# Patient Record
Sex: Male | Born: 1960 | Race: White | Hispanic: No | Smoking: Current every day smoker
Health system: Southern US, Community
[De-identification: ages and names within clinical notes are randomized; demographics above are authoritative.]

## PROBLEM LIST (undated history)

## (undated) ENCOUNTER — Ambulatory Visit: Admission: EM

## (undated) DIAGNOSIS — I10 Essential (primary) hypertension: Secondary | ICD-10-CM

## (undated) DIAGNOSIS — F329 Major depressive disorder, single episode, unspecified: Secondary | ICD-10-CM

## (undated) DIAGNOSIS — E039 Hypothyroidism, unspecified: Secondary | ICD-10-CM

## (undated) DIAGNOSIS — J449 Chronic obstructive pulmonary disease, unspecified: Secondary | ICD-10-CM

## (undated) DIAGNOSIS — E119 Type 2 diabetes mellitus without complications: Secondary | ICD-10-CM

## (undated) DIAGNOSIS — Z87442 Personal history of urinary calculi: Secondary | ICD-10-CM

## (undated) DIAGNOSIS — F32A Depression, unspecified: Secondary | ICD-10-CM

## (undated) DIAGNOSIS — M1990 Unspecified osteoarthritis, unspecified site: Secondary | ICD-10-CM

## (undated) DIAGNOSIS — G4730 Sleep apnea, unspecified: Secondary | ICD-10-CM

## (undated) DIAGNOSIS — K219 Gastro-esophageal reflux disease without esophagitis: Secondary | ICD-10-CM

## (undated) HISTORY — PX: RECTAL POLYPECTOMY: SHX2309

## (undated) HISTORY — DX: Hypothyroidism, unspecified: E03.9

## (undated) HISTORY — PX: OTHER SURGICAL HISTORY: SHX169

## (undated) HISTORY — PX: COLONOSCOPY: SHX174

---

## 2006-03-31 DIAGNOSIS — E1165 Type 2 diabetes mellitus with hyperglycemia: Secondary | ICD-10-CM | POA: Insufficient documentation

## 2006-03-31 DIAGNOSIS — N1831 Chronic kidney disease, stage 3a: Secondary | ICD-10-CM | POA: Insufficient documentation

## 2006-05-07 ENCOUNTER — Emergency Department: Payer: Self-pay | Admitting: Emergency Medicine

## 2008-01-14 ENCOUNTER — Ambulatory Visit: Payer: Self-pay | Admitting: Internal Medicine

## 2008-01-26 ENCOUNTER — Ambulatory Visit: Payer: Self-pay | Admitting: Internal Medicine

## 2008-09-14 ENCOUNTER — Ambulatory Visit: Payer: Self-pay | Admitting: Internal Medicine

## 2008-09-24 ENCOUNTER — Emergency Department: Payer: Self-pay | Admitting: Emergency Medicine

## 2010-04-24 ENCOUNTER — Ambulatory Visit: Payer: Self-pay | Admitting: Internal Medicine

## 2011-02-21 ENCOUNTER — Ambulatory Visit: Payer: Self-pay | Admitting: Internal Medicine

## 2011-04-09 ENCOUNTER — Ambulatory Visit: Payer: Self-pay | Admitting: Anesthesiology

## 2011-04-11 ENCOUNTER — Ambulatory Visit: Payer: Self-pay | Admitting: Emergency Medicine

## 2011-04-11 LAB — BASIC METABOLIC PANEL
Anion Gap: 7 (ref 7–16)
BUN: 14 mg/dL (ref 7–18)
Calcium, Total: 9.3 mg/dL (ref 8.5–10.1)
Chloride: 105 mmol/L (ref 98–107)
Co2: 28 mmol/L (ref 21–32)
Creatinine: 0.85 mg/dL (ref 0.60–1.30)
EGFR (African American): >60
EGFR (Non-African Amer.): >60
Glucose: 155 mg/dL — ABNORMAL HIGH (ref 65–99)
Osmolality: 283 (ref 275–301)
Potassium: 3.7 mmol/L (ref 3.5–5.1)
Sodium: 140 mmol/L (ref 136–145)

## 2011-04-14 LAB — PATHOLOGY REPORT

## 2013-06-14 DIAGNOSIS — F329 Major depressive disorder, single episode, unspecified: Secondary | ICD-10-CM | POA: Insufficient documentation

## 2013-06-14 DIAGNOSIS — I10 Essential (primary) hypertension: Secondary | ICD-10-CM | POA: Insufficient documentation

## 2013-06-14 DIAGNOSIS — G894 Chronic pain syndrome: Secondary | ICD-10-CM | POA: Insufficient documentation

## 2013-06-14 DIAGNOSIS — E785 Hyperlipidemia, unspecified: Secondary | ICD-10-CM | POA: Insufficient documentation

## 2013-06-14 DIAGNOSIS — J449 Chronic obstructive pulmonary disease, unspecified: Secondary | ICD-10-CM | POA: Insufficient documentation

## 2013-06-14 DIAGNOSIS — E1121 Type 2 diabetes mellitus with diabetic nephropathy: Secondary | ICD-10-CM | POA: Insufficient documentation

## 2013-06-14 DIAGNOSIS — F32A Depression, unspecified: Secondary | ICD-10-CM | POA: Insufficient documentation

## 2013-06-14 DIAGNOSIS — Z720 Tobacco use: Secondary | ICD-10-CM | POA: Insufficient documentation

## 2014-01-25 DIAGNOSIS — M5416 Radiculopathy, lumbar region: Secondary | ICD-10-CM | POA: Insufficient documentation

## 2014-01-31 ENCOUNTER — Ambulatory Visit: Payer: Self-pay | Admitting: Physical Medicine and Rehabilitation

## 2014-02-14 ENCOUNTER — Ambulatory Visit: Payer: Self-pay | Admitting: Internal Medicine

## 2014-02-28 DIAGNOSIS — M5136 Other intervertebral disc degeneration, lumbar region: Secondary | ICD-10-CM | POA: Insufficient documentation

## 2014-07-13 DIAGNOSIS — R0602 Shortness of breath: Secondary | ICD-10-CM | POA: Insufficient documentation

## 2014-07-23 NOTE — Op Note (Signed)
PATIENT NAME:  Darin Waters, Darin Waters MR#:  [id number] DATE OF BIRTH:  Aug 13, 1960  DATE OF PROCEDURE:  04/11/2011  PREOPERATIVE DIAGNOSIS: Rectal tumor. Possible villous adenoma at the junction of the dentate line and rectal mucosa.   POSTOPERATIVE DIAGNOSIS: Rectal tumor. Possible villous adenoma at the junction of the dentate line and rectal mucosa.   PROCEDURES PERFORMED:  1. Sigmoidoscopy.  2. Removal of rectal tumor with suture ligation.   SURGEON: Vonzell Lindblad S. Cecelia ByarsHashmi, MD  INDICATIONS: First of all this patient had a colonoscopy done. Retroflexion was found to have a polyp sessile lesion starting from dentate line going towards the distal rectal mucosa. It wasn'Waters very big but you could not remove it through the colonoscope because it was very near the dentate line. Patient was then brought to surgery.   DESCRIPTION OF PROCEDURE: Under general anesthesia in jackknife position rectal examination was performed. Sigmoidoscopy was done to 25 cm. I could visualize the area and blood was oozing from the surface. After dilating the anus a little bit then put retractor in and I grabbed the tumor with Allis clamps. It was soft. I put one suture way up in the top and rest of the lesion was excised with the Bovie. After that we closed with running and interrupted 3-0 chromic catgut sutures. At the time of surgery there wasn'Waters bleeding anymore and it was soft lesion. It doesn'Waters  look like to be cancerous but was very vascular. After that we made sure there was no bleeding. After that we put a piece of Surgicel with Avitene in the rectum. Patient tolerated well. Sent to recovery room in satisfactory condition.   ____________________________ Alton RevereMasud S. Cecelia ByarsHashmi, MD msh:cms D: 04/11/2011 10:24:14 ET Waters: 04/11/2011 13:28:10 ET JOB#: [id number] cc: Erienne Spelman S. Cecelia ByarsHashmi, MD, <Dictator> Meryle ReadyMASUD S Jguadalupe Opiela MD ELECTRONICALLY SIGNED 04/17/2011 9:56

## 2015-04-16 ENCOUNTER — Encounter: Payer: Self-pay | Admitting: *Deleted

## 2015-04-16 ENCOUNTER — Emergency Department
Admission: EM | Admit: 2015-04-16 | Discharge: 2015-04-16 | Discharge status: Against Medical Advice | Payer: Self-pay | Attending: Emergency Medicine | Admitting: Emergency Medicine

## 2015-04-16 ENCOUNTER — Emergency Department
Admission: EM | Admit: 2015-04-16 | Discharge: 2015-04-16 | Disposition: A | Discharge status: Home / Self Care | Payer: Self-pay | Attending: Emergency Medicine | Admitting: Emergency Medicine

## 2015-04-16 ENCOUNTER — Emergency Department: Payer: Self-pay

## 2015-04-16 DIAGNOSIS — M25512 Pain in left shoulder: Secondary | ICD-10-CM | POA: Insufficient documentation

## 2015-04-16 DIAGNOSIS — I10 Essential (primary) hypertension: Secondary | ICD-10-CM | POA: Insufficient documentation

## 2015-04-16 DIAGNOSIS — F17200 Nicotine dependence, unspecified, uncomplicated: Secondary | ICD-10-CM | POA: Insufficient documentation

## 2015-04-16 DIAGNOSIS — M542 Cervicalgia: Secondary | ICD-10-CM | POA: Insufficient documentation

## 2015-04-16 DIAGNOSIS — M5412 Radiculopathy, cervical region: Secondary | ICD-10-CM | POA: Insufficient documentation

## 2015-04-16 DIAGNOSIS — E119 Type 2 diabetes mellitus without complications: Secondary | ICD-10-CM | POA: Insufficient documentation

## 2015-04-16 DIAGNOSIS — M25519 Pain in unspecified shoulder: Secondary | ICD-10-CM

## 2015-04-16 HISTORY — DX: Essential (primary) hypertension: I10

## 2015-04-16 HISTORY — DX: Type 2 diabetes mellitus without complications: E11.9

## 2015-04-16 LAB — CBC
HCT: 45 % (ref 40.0–52.0)
Hemoglobin: 15.1 g/dL (ref 13.0–18.0)
MCH: 31.8 pg (ref 26.0–34.0)
MCHC: 33.5 g/dL (ref 32.0–36.0)
MCV: 95 fL (ref 80.0–100.0)
Platelets: 200 10*3/uL (ref 150–440)
RBC: 4.74 MIL/uL (ref 4.40–5.90)
RDW: 13.1 % (ref 11.5–14.5)
WBC: 9.5 10*3/uL (ref 3.8–10.6)

## 2015-04-16 LAB — BASIC METABOLIC PANEL
Anion gap: 11 (ref 5–15)
BUN: 17 mg/dL (ref 6–20)
CO2: 25 mmol/L (ref 22–32)
Calcium: 9.9 mg/dL (ref 8.9–10.3)
Chloride: 101 mmol/L (ref 101–111)
Creatinine, Ser: 0.99 mg/dL (ref 0.61–1.24)
GFR calc Af Amer: >60 mL/min (ref >60)
GFR calc non Af Amer: >60 mL/min (ref >60)
Glucose, Bld: 301 mg/dL — ABNORMAL HIGH (ref 65–99)
Potassium: 4.3 mmol/L (ref 3.5–5.1)
Sodium: 137 mmol/L (ref 135–145)

## 2015-04-16 LAB — TROPONIN I: Troponin I: <0.03 ng/mL (ref <0.031)

## 2015-04-16 MED ORDER — PREDNISONE 10 MG (21) PO TBPK: 10.0000 mg | ORAL_TABLET | Freq: Every day | ORAL | Status: DC

## 2015-04-16 MED ORDER — OXYCODONE-ACETAMINOPHEN 5-325 MG PO TABS: 2.0000 | ORAL_TABLET | Freq: Four times a day (QID) | ORAL | Status: DC | PRN

## 2015-04-16 NOTE — ED Notes (Signed)
States he developed a stiffness to left side of neck about 9 days ago  Then about 7 days ago he said the pain moved into left shoulder and arm  Denies any injury

## 2015-04-16 NOTE — ED Notes (Addendum)
Pt states muscle spasms in his neck and back, states pain for 9 days, pt was here earlier and left because "I sat in the room and no one came, just told the doctor to take care of everyone else"

## 2015-04-16 NOTE — ED Provider Notes (Signed)
[facility] Emergency Department Provider Note     Time seen: ----------------------------------------- 7:00 PM on 04/16/2015 -----------------------------------------    I have reviewed the triage vital signs and the nursing notes.   HISTORY  Chief Complaint Spasms and Neck Pain    HPI Darin Waters is a 55 y.o. male who presents ER for muscle spasms in his neck and back. Patient states he had pain for 9 days, was here earlier and left because he could not wait.Patient describes radiating pain down his left shoulder and arm. Patient states he has a history of slipped disc in his low back, has not typically had neck pain like this before. She denies fevers chills or other complaints. She states he knows his blood sugar was elevated over 300 today.   Past Medical History  Diagnosis Date  . Diabetes mellitus without complication (HCC)   . Hypertension     There are no active problems to display for this patient.   History reviewed. No pertinent past surgical history.  Allergies Review of patient's allergies indicates no known allergies.  Social History Social History  Substance Use Topics  . Smoking status: Current Every Day Smoker  . Smokeless tobacco: None  . Alcohol Use: No    Review of Systems Constitutional: Negative for fever. Eyes: Negative for visual changes. ENT: Negative for sore throat. Cardiovascular: Negative for chest pain. Respiratory: Negative for shortness of breath. Gastrointestinal: Negative for abdominal pain, vomiting and diarrhea. Genitourinary: Negative for dysuria. Musculoskeletal: Positive for left shoulder, left arm pain Skin: Negative for rash. Neurological: Negative for headaches, focal weakness or numbness.  10-point ROS otherwise negative.  ____________________________________________   PHYSICAL EXAM:  VITAL SIGNS: ED Triage Vitals  Enc Vitals Group     BP 04/16/15 1851 152/68 mmHg     Pulse  Rate 04/16/15 1851 90     Resp 04/16/15 1851 18     Temp 04/16/15 1851 98.5 F (36.9 C)     Temp Source 04/16/15 1851 Oral     SpO2 04/16/15 1851 96 %     Weight 04/16/15 1851 200 lb (90.719 kg)     Height 04/16/15 1851 5\' 6"  (1.676 m)     Head Cir --      Peak Flow --      Pain Score 04/16/15 1851 5     Pain Loc --      Pain Edu? --      Excl. in GC? --     Constitutional: Alert and oriented. Well appearing and in no distress. Eyes: Conjunctivae are normal. PERRL. Normal extraocular movements. ENT   Head: Normocephalic and atraumatic.   Nose: No congestion/rhinnorhea.   Mouth/Throat: Mucous membranes are moist.   Neck: No stridor. Cardiovascular: Normal rate, regular rhythm. Normal and symmetric distal pulses are present in all extremities. No murmurs, rubs, or gallops. Respiratory: Normal respiratory effort without tachypnea nor retractions. Breath sounds are clear and equal bilaterally. No wheezes/rales/rhonchi. Gastrointestinal: Soft and nontender. No distention. No abdominal bruits.  Musculoskeletal: Patient describes a C6 radiculopathy, mild pain with range of motion of left shoulder. Neurologic:  Normal speech and language. No gross focal neurologic deficits are appreciated. Speech is normal. No gait instability. Skin:  Skin is warm, dry and intact. No rash noted. Psychiatric: Mood and affect are normal. Speech and behavior are normal. Patient exhibits appropriate insight and judgment. ____________________________________________  ED COURSE:  Pertinent labs & imaging results that were available during my care of the patient were  reviewed by me and considered in my medical decision making (see chart for details). EKG earlier was normal, labs are unremarkable. ____________________________________________    RADIOLOGY Images were viewed by me  Chest x-ray today was normal  ____________________________________________  FINAL ASSESSMENT AND  PLAN  Cervical radiculopathy  Plan: Patient with labs and imaging as dictated above. Patient clinically with cervical radiculopathy, will prescribe pain medicine and steroid taper. He is stable for outpatient follow-up with physiatry.   Emily FilbertWilliams, Jonathan E, MD   Emily FilbertJonathan E Williams, MD 04/16/15 [id number]

## 2015-04-16 NOTE — Discharge Instructions (Signed)

## 2015-04-16 NOTE — ED Provider Notes (Signed)
Patient eloped prior to my evaluation. He had presented to the ER with some stiffness in the left side is neck this started about 9 days ago. Patient was ambulatory in the ER, stated he couldn't wait to be seen.  EKG: Interpreted by me, normal sinus rhythm with a rate of 66 bpm, normal PR interval, normal QRS, normal QT interval, normal axis.  Emily FilbertJonathan E Williams, MD 04/16/15 [phone]

## 2015-04-16 NOTE — ED Notes (Signed)
Has had neck pain for 2 days on left side but had left shoulder pain for 7 days, denies injury , pain comes and goes

## 2015-05-11 ENCOUNTER — Other Ambulatory Visit: Payer: Self-pay | Admitting: Physical Medicine and Rehabilitation

## 2015-05-11 DIAGNOSIS — M5412 Radiculopathy, cervical region: Secondary | ICD-10-CM

## 2015-05-30 ENCOUNTER — Ambulatory Visit
Admission: RE | Admit: 2015-05-30 | Discharge: 2015-05-30 | Disposition: A | Discharge status: Home / Self Care | Payer: BLUE CROSS/BLUE SHIELD | Source: Ambulatory Visit | Attending: Physical Medicine and Rehabilitation | Admitting: Physical Medicine and Rehabilitation

## 2015-05-30 DIAGNOSIS — M4802 Spinal stenosis, cervical region: Secondary | ICD-10-CM | POA: Insufficient documentation

## 2015-05-30 DIAGNOSIS — M50222 Other cervical disc displacement at C5-C6 level: Secondary | ICD-10-CM | POA: Diagnosis not present

## 2015-05-30 DIAGNOSIS — M5412 Radiculopathy, cervical region: Secondary | ICD-10-CM | POA: Diagnosis present

## 2015-05-30 DIAGNOSIS — M50223 Other cervical disc displacement at C6-C7 level: Secondary | ICD-10-CM | POA: Insufficient documentation

## 2015-06-14 DIAGNOSIS — M4712 Other spondylosis with myelopathy, cervical region: Secondary | ICD-10-CM | POA: Insufficient documentation

## 2015-06-14 DIAGNOSIS — M4802 Spinal stenosis, cervical region: Secondary | ICD-10-CM | POA: Insufficient documentation

## 2015-06-14 DIAGNOSIS — M5412 Radiculopathy, cervical region: Secondary | ICD-10-CM | POA: Insufficient documentation

## 2015-06-15 ENCOUNTER — Other Ambulatory Visit: Payer: Self-pay | Admitting: Neurosurgery

## 2015-07-09 NOTE — Pre-Procedure Instructions (Signed)
Nancy FetterDanny T Hamblen  07/09/2015      [phone] [place] [address] [facility] [address] [place] [id number] Phone: [phone] Fax: [phone]    Your procedure is scheduled on Wednesday April 19th  [facility] at 12:00 pm  Call this number if you have problems the morning of surgery  [phone]  If questions prior to surgery call [phone] between 8 am and 4:00 pm   Remember:  Do not eat food or drink liquids after midnight.  Take these medicines the morning of surgery with A SIP OF WATER: Celexa, pain pill if needed  Stop taking Aspirin, and anti-inflammatories (including your Relafen, ibuprofen, Motrin, aleve, naproxen, advil), vitamin and mineral supplements)  DO NOT TAKE DIABETIC PILLS THE MORNING OF SURGERY  How to Manage Your Diabetes Before and After Surgery  Why is it important to control my blood sugar before and after surgery? . Improving blood sugar levels before and after surgery helps healing and can limit problems. . A way of improving blood sugar control is eating a healthy diet by: o  Eating less sugar and carbohydrates o  Increasing activity/exercise o  Talking with your doctor about reaching your blood sugar goals . High blood sugars (greater than 180 mg/dL) can raise your risk of infections and slow your recovery, so you will need to focus on controlling your diabetes during the weeks before surgery. . Make sure that the doctor who takes care of your diabetes knows about your planned surgery including the date and location.  How do I manage my blood sugar before surgery? . Check your blood sugar at least 4 times a day, starting 2 days before surgery, to make sure that the level is not too high or low. o Check your blood sugar the morning of your surgery when you wake up and every 2 hours until you get to the Short Stay unit. . If your blood sugar is less than 70  mg/dL, you will need to treat for low blood sugar: o Do not take insulin. o Treat a low blood sugar (less than 70 mg/dL) with  cup of clear juice (cranberry or apple), 4 glucose tablets, OR glucose gel. o Recheck blood sugar in 15 minutes after treatment (to make sure it is greater than 70 mg/dL). If your blood sugar is not greater than 70 mg/dL on recheck, call [phone] for further instructions. . Report your blood sugar to the short stay nurse when you get to Short Stay.  . If you are admitted to the hospital after surgery: o Your blood sugar will be checked by the staff and you will probably be given insulin after surgery (instead of oral diabetes medicines) to make sure you have good blood sugar levels. o The goal for blood sugar control after surgery is 80-180 mg/dL     Do not wear jewelry.  Do not wear lotions, powders, or colognes.  You may not wear deodorant.  Do not shave 48 hours prior to surgery.  Men may shave face and neck.  Do not bring valuables to the hospital.  [facility] is not responsible for any belongings or valuables.  Contacts, dentures or bridgework may not be worn into surgery.  Leave your suitcase in the car.  After surgery it may be brought to your room.  For patients admitted to the hospital, discharge time will  be determined by your treatment team.  Special instructions: Shower with CHG soap as instructed the night before and morning of surgery  Please read over the following fact sheets that you were given. Pain Booklet, Coughing and Deep Breathing, MRSA Information and Surgical Site Infection Prevention

## 2015-07-10 ENCOUNTER — Encounter (HOSPITAL_COMMUNITY): Payer: Self-pay

## 2015-07-10 ENCOUNTER — Encounter (HOSPITAL_COMMUNITY)
Admission: RE | Admit: 2015-07-10 | Discharge: 2015-07-10 | Disposition: A | Discharge status: Home / Self Care | Payer: BLUE CROSS/BLUE SHIELD | Source: Ambulatory Visit | Attending: Neurosurgery | Admitting: Neurosurgery

## 2015-07-10 DIAGNOSIS — Z01812 Encounter for preprocedural laboratory examination: Secondary | ICD-10-CM | POA: Insufficient documentation

## 2015-07-10 DIAGNOSIS — M4722 Other spondylosis with radiculopathy, cervical region: Secondary | ICD-10-CM | POA: Insufficient documentation

## 2015-07-10 DIAGNOSIS — M4712 Other spondylosis with myelopathy, cervical region: Secondary | ICD-10-CM | POA: Insufficient documentation

## 2015-07-10 LAB — BASIC METABOLIC PANEL
Anion gap: 11 (ref 5–15)
BUN: 13 mg/dL (ref 6–20)
CO2: 24 mmol/L (ref 22–32)
Calcium: 9.5 mg/dL (ref 8.9–10.3)
Chloride: 103 mmol/L (ref 101–111)
Creatinine, Ser: 0.94 mg/dL (ref 0.61–1.24)
GFR calc Af Amer: >60 mL/min (ref >60)
GFR calc non Af Amer: >60 mL/min (ref >60)
Glucose, Bld: 142 mg/dL — ABNORMAL HIGH (ref 65–99)
Potassium: 4.1 mmol/L (ref 3.5–5.1)
Sodium: 138 mmol/L (ref 135–145)

## 2015-07-10 LAB — SURGICAL PCR SCREEN
MRSA, PCR: NEGATIVE
Staphylococcus aureus: NEGATIVE

## 2015-07-10 LAB — CBC
HCT: 43.5 % (ref 39.0–52.0)
Hemoglobin: 14.7 g/dL (ref 13.0–17.0)
MCH: 32.5 pg (ref 26.0–34.0)
MCHC: 33.8 g/dL (ref 30.0–36.0)
MCV: 96 fL (ref 78.0–100.0)
Platelets: 215 10*3/uL (ref 150–400)
RBC: 4.53 MIL/uL (ref 4.22–5.81)
RDW: 12.7 % (ref 11.5–15.5)
WBC: 9.8 10*3/uL (ref 4.0–10.5)

## 2015-07-10 LAB — GLUCOSE, CAPILLARY: Glucose-Capillary: 165 mg/dL — ABNORMAL HIGH (ref 65–99)

## 2015-07-10 NOTE — Progress Notes (Addendum)
Cardiologist Dr Marcina MillardAlexander Paraschos [place] [facility]  [phone] PCP Dr Sharalyn InkSarah Gauger [facility] [place]  Stress echo test 2016  In Care everywhere/ normal  Denies other cardiac studies [facility]

## 2015-07-11 LAB — HEMOGLOBIN A1C
Hgb A1c MFr Bld: 7.5 % — ABNORMAL HIGH (ref 4.8–5.6)
Mean Plasma Glucose: 169 mg/dL

## 2015-07-17 MED ORDER — CEFAZOLIN SODIUM-DEXTROSE 2-4 GM/100ML-% IV SOLN
2.0000 g | INTRAVENOUS | Status: AC
  Administered 2015-07-18: 2 g via INTRAVENOUS
  Filled 2015-07-17: qty 100

## 2015-07-18 ENCOUNTER — Ambulatory Visit (HOSPITAL_COMMUNITY): Payer: BLUE CROSS/BLUE SHIELD

## 2015-07-18 ENCOUNTER — Ambulatory Visit (HOSPITAL_COMMUNITY): Payer: BLUE CROSS/BLUE SHIELD | Admitting: Certified Registered Nurse Anesthetist

## 2015-07-18 ENCOUNTER — Encounter (HOSPITAL_COMMUNITY): Payer: Self-pay | Admitting: *Deleted

## 2015-07-18 ENCOUNTER — Encounter (HOSPITAL_COMMUNITY)
Admission: RE | Disposition: A | Discharge status: Home / Self Care | Payer: Self-pay | Source: Ambulatory Visit | Attending: Neurosurgery

## 2015-07-18 ENCOUNTER — Observation Stay (HOSPITAL_COMMUNITY)
Admission: RE | Admit: 2015-07-18 | Discharge: 2015-07-19 | Disposition: A | Discharge status: Home / Self Care | Payer: BLUE CROSS/BLUE SHIELD | Source: Ambulatory Visit | Attending: Neurosurgery | Admitting: Neurosurgery

## 2015-07-18 DIAGNOSIS — Z79899 Other long term (current) drug therapy: Secondary | ICD-10-CM | POA: Insufficient documentation

## 2015-07-18 DIAGNOSIS — M4722 Other spondylosis with radiculopathy, cervical region: Secondary | ICD-10-CM | POA: Diagnosis not present

## 2015-07-18 DIAGNOSIS — Z419 Encounter for procedure for purposes other than remedying health state, unspecified: Secondary | ICD-10-CM

## 2015-07-18 DIAGNOSIS — J449 Chronic obstructive pulmonary disease, unspecified: Secondary | ICD-10-CM | POA: Diagnosis not present

## 2015-07-18 DIAGNOSIS — I10 Essential (primary) hypertension: Secondary | ICD-10-CM | POA: Diagnosis not present

## 2015-07-18 DIAGNOSIS — F17210 Nicotine dependence, cigarettes, uncomplicated: Secondary | ICD-10-CM | POA: Diagnosis not present

## 2015-07-18 DIAGNOSIS — M4712 Other spondylosis with myelopathy, cervical region: Secondary | ICD-10-CM | POA: Insufficient documentation

## 2015-07-18 DIAGNOSIS — Z7984 Long term (current) use of oral hypoglycemic drugs: Secondary | ICD-10-CM | POA: Insufficient documentation

## 2015-07-18 DIAGNOSIS — E119 Type 2 diabetes mellitus without complications: Secondary | ICD-10-CM | POA: Diagnosis not present

## 2015-07-18 DIAGNOSIS — M5020 Other cervical disc displacement, unspecified cervical region: Secondary | ICD-10-CM | POA: Diagnosis present

## 2015-07-18 DIAGNOSIS — M4802 Spinal stenosis, cervical region: Secondary | ICD-10-CM | POA: Diagnosis not present

## 2015-07-18 DIAGNOSIS — Z7982 Long term (current) use of aspirin: Secondary | ICD-10-CM | POA: Diagnosis not present

## 2015-07-18 DIAGNOSIS — M50122 Cervical disc disorder at C5-C6 level with radiculopathy: Principal | ICD-10-CM | POA: Insufficient documentation

## 2015-07-18 HISTORY — PX: ANTERIOR CERVICAL DECOMP/DISCECTOMY FUSION: SHX1161

## 2015-07-18 LAB — GLUCOSE, CAPILLARY
Glucose-Capillary: 112 mg/dL — ABNORMAL HIGH (ref 65–99)
Glucose-Capillary: 204 mg/dL — ABNORMAL HIGH (ref 65–99)
Glucose-Capillary: 220 mg/dL — ABNORMAL HIGH (ref 65–99)

## 2015-07-18 SURGERY — ANTERIOR CERVICAL DECOMPRESSION/DISCECTOMY FUSION 2 LEVELS
Anesthesia: General | Site: Spine Cervical

## 2015-07-18 MED ORDER — MORPHINE SULFATE (PF) 2 MG/ML IV SOLN
1.0000 mg | INTRAVENOUS | Status: DC | PRN
  Administered 2015-07-19: 2 mg via INTRAVENOUS
  Filled 2015-07-18: qty 1

## 2015-07-18 MED ORDER — BISACODYL 10 MG RE SUPP: 10.0000 mg | Freq: Every day | RECTAL | Status: DC | PRN

## 2015-07-18 MED ORDER — GLYCOPYRROLATE 0.2 MG/ML IJ SOLN
INTRAMUSCULAR | Status: DC | PRN
  Administered 2015-07-18: 0.4 mg via INTRAVENOUS

## 2015-07-18 MED ORDER — DEXAMETHASONE SODIUM PHOSPHATE 4 MG/ML IJ SOLN
INTRAMUSCULAR | Status: DC | PRN
  Administered 2015-07-18: 8 mg via INTRAVENOUS

## 2015-07-18 MED ORDER — FENTANYL CITRATE (PF) 100 MCG/2ML IJ SOLN
25.0000 ug | INTRAMUSCULAR | Status: DC | PRN
  Administered 2015-07-18 (×2): 50 ug via INTRAVENOUS

## 2015-07-18 MED ORDER — DIAZEPAM 5 MG PO TABS
5.0000 mg | ORAL_TABLET | Freq: Four times a day (QID) | ORAL | Status: DC | PRN
  Administered 2015-07-18 – 2015-07-19 (×2): 5 mg via ORAL
  Filled 2015-07-18 (×2): qty 1

## 2015-07-18 MED ORDER — PROPOFOL 10 MG/ML IV BOLUS
INTRAVENOUS | Status: AC
  Filled 2015-07-18: qty 20

## 2015-07-18 MED ORDER — ESMOLOL HCL 100 MG/10ML IV SOLN
INTRAVENOUS | Status: DC | PRN
  Administered 2015-07-18: 20 mg via INTRAVENOUS

## 2015-07-18 MED ORDER — FENTANYL CITRATE (PF) 250 MCG/5ML IJ SOLN
INTRAMUSCULAR | Status: AC
  Filled 2015-07-18: qty 5

## 2015-07-18 MED ORDER — DOCUSATE SODIUM 100 MG PO CAPS
100.0000 mg | ORAL_CAPSULE | Freq: Two times a day (BID) | ORAL | Status: DC
  Administered 2015-07-18 – 2015-07-19 (×2): 100 mg via ORAL
  Filled 2015-07-18 (×2): qty 1

## 2015-07-18 MED ORDER — BACITRACIN ZINC 500 UNIT/GM EX OINT
TOPICAL_OINTMENT | CUTANEOUS | Status: DC | PRN
  Administered 2015-07-18: 1 via TOPICAL

## 2015-07-18 MED ORDER — EPHEDRINE SULFATE 50 MG/ML IJ SOLN
INTRAMUSCULAR | Status: DC | PRN
  Administered 2015-07-18 (×3): 10 mg via INTRAVENOUS

## 2015-07-18 MED ORDER — HEMOSTATIC AGENTS (NO CHARGE) OPTIME
TOPICAL | Status: DC | PRN
  Administered 2015-07-18: 1 via TOPICAL

## 2015-07-18 MED ORDER — ONDANSETRON HCL 4 MG/2ML IJ SOLN
INTRAMUSCULAR | Status: AC
  Filled 2015-07-18: qty 2

## 2015-07-18 MED ORDER — SUCCINYLCHOLINE CHLORIDE 20 MG/ML IJ SOLN
INTRAMUSCULAR | Status: AC
  Filled 2015-07-18: qty 1

## 2015-07-18 MED ORDER — ACETAMINOPHEN 325 MG PO TABS
650.0000 mg | ORAL_TABLET | ORAL | Status: DC | PRN
Start: 2015-07-18 — End: 2015-07-19

## 2015-07-18 MED ORDER — CEFAZOLIN SODIUM-DEXTROSE 2-4 GM/100ML-% IV SOLN
2.0000 g | Freq: Three times a day (TID) | INTRAVENOUS | Status: AC
  Administered 2015-07-18 – 2015-07-19 (×2): 2 g via INTRAVENOUS
  Filled 2015-07-18 (×2): qty 100

## 2015-07-18 MED ORDER — MIDAZOLAM HCL 2 MG/2ML IJ SOLN
INTRAMUSCULAR | Status: AC
Start: 2015-07-18 — End: 2015-07-18
  Filled 2015-07-18: qty 2

## 2015-07-18 MED ORDER — EPHEDRINE SULFATE 50 MG/ML IJ SOLN
INTRAMUSCULAR | Status: AC
  Filled 2015-07-18: qty 1

## 2015-07-18 MED ORDER — LACTATED RINGERS IV SOLN
INTRAVENOUS | Status: DC
  Administered 2015-07-18 (×3): via INTRAVENOUS

## 2015-07-18 MED ORDER — CITALOPRAM HYDROBROMIDE 10 MG PO TABS
10.0000 mg | ORAL_TABLET | Freq: Every day | ORAL | Status: DC
  Administered 2015-07-18 – 2015-07-19 (×2): 10 mg via ORAL
  Filled 2015-07-18 (×2): qty 1

## 2015-07-18 MED ORDER — PHENYLEPHRINE 40 MCG/ML (10ML) SYRINGE FOR IV PUSH (FOR BLOOD PRESSURE SUPPORT)
PREFILLED_SYRINGE | INTRAVENOUS | Status: AC
  Filled 2015-07-18: qty 10

## 2015-07-18 MED ORDER — ACETAMINOPHEN 650 MG RE SUPP: 650.0000 mg | RECTAL | Status: DC | PRN

## 2015-07-18 MED ORDER — INSULIN ASPART 100 UNIT/ML ~~LOC~~ SOLN
0.0000 [IU] | SUBCUTANEOUS | Status: DC
  Administered 2015-07-18 – 2015-07-19 (×3): 7 [IU] via SUBCUTANEOUS
  Administered 2015-07-19: 4 [IU] via SUBCUTANEOUS
  Administered 2015-07-19: 7 [IU] via SUBCUTANEOUS

## 2015-07-18 MED ORDER — NEOSTIGMINE METHYLSULFATE 10 MG/10ML IV SOLN
INTRAVENOUS | Status: AC
  Filled 2015-07-18: qty 3

## 2015-07-18 MED ORDER — GLIMEPIRIDE 2 MG PO TABS
2.0000 mg | ORAL_TABLET | Freq: Every day | ORAL | Status: DC
  Administered 2015-07-19: 2 mg via ORAL
  Filled 2015-07-18: qty 1

## 2015-07-18 MED ORDER — THROMBIN 5000 UNITS EX SOLR
CUTANEOUS | Status: DC | PRN
  Administered 2015-07-18 (×2): 5000 [IU] via TOPICAL

## 2015-07-18 MED ORDER — FENTANYL CITRATE (PF) 100 MCG/2ML IJ SOLN
INTRAMUSCULAR | Status: DC | PRN
  Administered 2015-07-18 (×3): 50 ug via INTRAVENOUS
  Administered 2015-07-18: 100 ug via INTRAVENOUS
  Administered 2015-07-18 (×2): 50 ug via INTRAVENOUS

## 2015-07-18 MED ORDER — BENAZEPRIL HCL 20 MG PO TABS
20.0000 mg | ORAL_TABLET | Freq: Every day | ORAL | Status: DC
  Administered 2015-07-19: 20 mg via ORAL
  Filled 2015-07-18: qty 1

## 2015-07-18 MED ORDER — LIDOCAINE HCL (CARDIAC) 20 MG/ML IV SOLN
INTRAVENOUS | Status: AC
  Filled 2015-07-18: qty 5

## 2015-07-18 MED ORDER — ROCURONIUM BROMIDE 100 MG/10ML IV SOLN
INTRAVENOUS | Status: DC | PRN
  Administered 2015-07-18: 20 mg via INTRAVENOUS
  Administered 2015-07-18: 10 mg via INTRAVENOUS
  Administered 2015-07-18: 50 mg via INTRAVENOUS

## 2015-07-18 MED ORDER — METFORMIN HCL 500 MG PO TABS
1000.0000 mg | ORAL_TABLET | Freq: Two times a day (BID) | ORAL | Status: DC
  Administered 2015-07-19: 1000 mg via ORAL
  Filled 2015-07-18: qty 2

## 2015-07-18 MED ORDER — HYDROCODONE-ACETAMINOPHEN 5-325 MG PO TABS: 1.0000 | ORAL_TABLET | ORAL | Status: DC | PRN

## 2015-07-18 MED ORDER — ONDANSETRON HCL 4 MG/2ML IJ SOLN: 4.0000 mg | INTRAMUSCULAR | Status: DC | PRN

## 2015-07-18 MED ORDER — MENTHOL 3 MG MT LOZG
1.0000 | LOZENGE | OROMUCOSAL | Status: DC | PRN
  Administered 2015-07-19: 3 mg via ORAL
  Filled 2015-07-18: qty 9

## 2015-07-18 MED ORDER — MIDAZOLAM HCL 5 MG/5ML IJ SOLN
INTRAMUSCULAR | Status: DC | PRN
Start: 2015-07-18 — End: 2015-07-18
  Administered 2015-07-18: 2 mg via INTRAVENOUS

## 2015-07-18 MED ORDER — PROPOFOL 10 MG/ML IV BOLUS
INTRAVENOUS | Status: DC | PRN
  Administered 2015-07-18: 200 mg via INTRAVENOUS

## 2015-07-18 MED ORDER — LACTATED RINGERS IV SOLN
INTRAVENOUS | Status: DC
  Administered 2015-07-18: 22:00:00 via INTRAVENOUS

## 2015-07-18 MED ORDER — SODIUM CHLORIDE 0.9 % IR SOLN
Status: DC | PRN
  Administered 2015-07-18: 16:00:00

## 2015-07-18 MED ORDER — FENTANYL CITRATE (PF) 100 MCG/2ML IJ SOLN
INTRAMUSCULAR | Status: AC
  Administered 2015-07-18: 50 ug via INTRAVENOUS
  Filled 2015-07-18: qty 2

## 2015-07-18 MED ORDER — PHENOL 1.4 % MT LIQD: 1.0000 | OROMUCOSAL | Status: DC | PRN

## 2015-07-18 MED ORDER — SIMVASTATIN 20 MG PO TABS
20.0000 mg | ORAL_TABLET | Freq: Every day | ORAL | Status: DC
  Administered 2015-07-18: 20 mg via ORAL
  Filled 2015-07-18: qty 1

## 2015-07-18 MED ORDER — OXYCODONE-ACETAMINOPHEN 5-325 MG PO TABS
1.0000 | ORAL_TABLET | ORAL | Status: DC | PRN
  Administered 2015-07-19 (×2): 1 via ORAL
  Filled 2015-07-18 (×2): qty 1

## 2015-07-18 MED ORDER — ALUM & MAG HYDROXIDE-SIMETH 200-200-20 MG/5ML PO SUSP: 30.0000 mL | Freq: Four times a day (QID) | ORAL | Status: DC | PRN

## 2015-07-18 MED ORDER — DEXAMETHASONE SODIUM PHOSPHATE 4 MG/ML IJ SOLN
4.0000 mg | Freq: Four times a day (QID) | INTRAMUSCULAR | Status: AC
  Administered 2015-07-18 – 2015-07-19 (×3): 4 mg via INTRAVENOUS
  Filled 2015-07-18 (×3): qty 1

## 2015-07-18 MED ORDER — ESMOLOL HCL 100 MG/10ML IV SOLN
INTRAVENOUS | Status: AC
  Filled 2015-07-18: qty 10

## 2015-07-18 MED ORDER — ONDANSETRON HCL 4 MG/2ML IJ SOLN
INTRAMUSCULAR | Status: DC | PRN
  Administered 2015-07-18 (×2): 4 mg via INTRAVENOUS

## 2015-07-18 MED ORDER — LIDOCAINE HCL (CARDIAC) 20 MG/ML IV SOLN
INTRAVENOUS | Status: DC | PRN
  Administered 2015-07-18: 100 mg via INTRAVENOUS

## 2015-07-18 MED ORDER — OXYCODONE-ACETAMINOPHEN 5-325 MG PO TABS: 1.0000 | ORAL_TABLET | ORAL | Status: DC | PRN

## 2015-07-18 MED ORDER — ROCURONIUM BROMIDE 50 MG/5ML IV SOLN
INTRAVENOUS | Status: AC
  Filled 2015-07-18: qty 1

## 2015-07-18 MED ORDER — NEOSTIGMINE METHYLSULFATE 10 MG/10ML IV SOLN
INTRAVENOUS | Status: DC | PRN
  Administered 2015-07-18: 3 mg via INTRAVENOUS

## 2015-07-18 MED ORDER — BUPIVACAINE-EPINEPHRINE (PF) 0.5% -1:200000 IJ SOLN
INTRAMUSCULAR | Status: DC | PRN
  Administered 2015-07-18: 10 mL

## 2015-07-18 MED ORDER — AMLODIPINE BESY-BENAZEPRIL HCL 5-20 MG PO CAPS: 1.0000 | ORAL_CAPSULE | Freq: Every day | ORAL | Status: DC

## 2015-07-18 MED ORDER — DEXAMETHASONE 4 MG PO TABS: 4.0000 mg | ORAL_TABLET | Freq: Four times a day (QID) | ORAL | Status: AC

## 2015-07-18 MED ORDER — AMLODIPINE BESYLATE 5 MG PO TABS
5.0000 mg | ORAL_TABLET | Freq: Every day | ORAL | Status: DC
  Administered 2015-07-19: 5 mg via ORAL
  Filled 2015-07-18 (×2): qty 1

## 2015-07-18 SURGICAL SUPPLY — 61 items
BAG DECANTER FOR FLEXI CONT (MISCELLANEOUS) ×3 IMPLANT
BENZOIN TINCTURE PRP APPL 2/3 (GAUZE/BANDAGES/DRESSINGS) ×3 IMPLANT
BIT DRILL NEURO 2X3.1 SFT TUCH (MISCELLANEOUS) ×1 IMPLANT
BLADE SURG 15 STRL LF DISP TIS (BLADE) ×1 IMPLANT
BLADE SURG 15 STRL SS (BLADE) ×2
BLADE ULTRA TIP 2M (BLADE) ×3 IMPLANT
BRUSH SCRUB EZ PLAIN DRY (MISCELLANEOUS) ×3 IMPLANT
BUR BARREL STRAIGHT FLUTE 4.0 (BURR) ×3 IMPLANT
BUR MATCHSTICK NEURO 3.0 LAGG (BURR) ×3 IMPLANT
CANISTER SUCT 3000ML PPV (MISCELLANEOUS) ×3 IMPLANT
CLOSURE WOUND 1/2 X4 (GAUZE/BANDAGES/DRESSINGS) ×1
COVER MAYO STAND STRL (DRAPES) ×3 IMPLANT
DEVICE FUSION VIST S 14X14X6MM (Trauma) ×1 IMPLANT
DRAPE LAPAROTOMY 100X72 PEDS (DRAPES) ×3 IMPLANT
DRAPE MICROSCOPE LEICA (MISCELLANEOUS) IMPLANT
DRAPE POUCH INSTRU U-SHP 10X18 (DRAPES) ×3 IMPLANT
DRAPE SURG 17X23 STRL (DRAPES) ×6 IMPLANT
DRILL NEURO 2X3.1 SOFT TOUCH (MISCELLANEOUS) ×3
ELECT REM PT RETURN 9FT ADLT (ELECTROSURGICAL) ×3
ELECTRODE REM PT RTRN 9FT ADLT (ELECTROSURGICAL) ×1 IMPLANT
GAUZE SPONGE 4X4 12PLY STRL (GAUZE/BANDAGES/DRESSINGS) ×3 IMPLANT
GAUZE SPONGE 4X4 16PLY XRAY LF (GAUZE/BANDAGES/DRESSINGS) IMPLANT
GLOVE BIO SURGEON STRL SZ 6.5 (GLOVE) ×4 IMPLANT
GLOVE BIO SURGEON STRL SZ7 (GLOVE) ×6 IMPLANT
GLOVE BIO SURGEON STRL SZ7.5 (GLOVE) ×6 IMPLANT
GLOVE BIO SURGEON STRL SZ8 (GLOVE) ×6 IMPLANT
GLOVE BIO SURGEON STRL SZ8.5 (GLOVE) ×3 IMPLANT
GLOVE BIO SURGEONS STRL SZ 6.5 (GLOVE) ×2
GLOVE BIOGEL PI IND STRL 7.0 (GLOVE) ×2 IMPLANT
GLOVE BIOGEL PI IND STRL 7.5 (GLOVE) ×2 IMPLANT
GLOVE BIOGEL PI INDICATOR 7.0 (GLOVE) ×4
GLOVE BIOGEL PI INDICATOR 7.5 (GLOVE) ×4
GLOVE ECLIPSE 6.5 STRL STRAW (GLOVE) ×3 IMPLANT
GOWN STRL REUS W/ TWL LRG LVL3 (GOWN DISPOSABLE) ×2 IMPLANT
GOWN STRL REUS W/ TWL XL LVL3 (GOWN DISPOSABLE) ×2 IMPLANT
GOWN STRL REUS W/TWL LRG LVL3 (GOWN DISPOSABLE) ×4
GOWN STRL REUS W/TWL XL LVL3 (GOWN DISPOSABLE) ×4
KIT BASIN OR (CUSTOM PROCEDURE TRAY) ×3 IMPLANT
KIT ROOM TURNOVER OR (KITS) ×3 IMPLANT
MARKER SKIN DUAL TIP RULER LAB (MISCELLANEOUS) ×3 IMPLANT
NEEDLE HYPO 22GX1.5 SAFETY (NEEDLE) ×3 IMPLANT
NEEDLE SPNL 18GX3.5 QUINCKE PK (NEEDLE) ×3 IMPLANT
NS IRRIG 1000ML POUR BTL (IV SOLUTION) ×3 IMPLANT
PACK LAMINECTOMY NEURO (CUSTOM PROCEDURE TRAY) ×3 IMPLANT
PEEK VISTA 14X14X7MM (Peek) ×3 IMPLANT
PIN DISTRACTION 14MM (PIN) ×6 IMPLANT
PLATE ANT CERV XTEND 2 LV 30 (Plate) ×3 IMPLANT
RUBBERBAND STERILE (MISCELLANEOUS) IMPLANT
SCREW XTD VAR 4.2 SELF TAP (Screw) ×12 IMPLANT
SPONGE INTESTINAL PEANUT (DISPOSABLE) ×6 IMPLANT
SPONGE SURGIFOAM ABS GEL SZ50 (HEMOSTASIS) ×3 IMPLANT
STRIP BIOACTIVE 5CC 25X50X4MM (Miscellaneous) ×3 IMPLANT
STRIP CLOSURE SKIN 1/2X4 (GAUZE/BANDAGES/DRESSINGS) ×2 IMPLANT
SUT VIC AB 0 CT1 27 (SUTURE) ×2
SUT VIC AB 0 CT1 27XBRD ANTBC (SUTURE) ×1 IMPLANT
SUT VIC AB 3-0 SH 8-18 (SUTURE) ×3 IMPLANT
TAPE CLOTH SURG 4X10 WHT LF (GAUZE/BANDAGES/DRESSINGS) ×3 IMPLANT
TOWEL OR 17X24 6PK STRL BLUE (TOWEL DISPOSABLE) ×3 IMPLANT
TOWEL OR 17X26 10 PK STRL BLUE (TOWEL DISPOSABLE) ×3 IMPLANT
VISTA S O 14X14X6MM (Trauma) ×3 IMPLANT
WATER STERILE IRR 1000ML POUR (IV SOLUTION) ×3 IMPLANT

## 2015-07-18 NOTE — Progress Notes (Signed)
Patient ID: Darin Waters, male   DOB: 06/08/1960, 55 y.o.   MRN: [id number] Subjective:  The patient is alert and pleasant. He is in no apparent distress. He looks well.  Objective: Vital signs in last 24 hours: Temp:  [97.5 F (36.4 C)-98 F (36.7 C)] 97.5 F (36.4 C) (04/19 1719) Pulse Rate:  [64-92] 92 (04/19 1721) Resp:  [17-20] 20 (04/19 1730) BP: (136-148)/(77-84) 148/77 mmHg (04/19 1721) SpO2:  [93 %-99 %] 93 % (04/19 1721) Weight:  [91.037 kg (200 lb 11.2 oz)] 91.037 kg (200 lb 11.2 oz) (04/19 1209)  Intake/Output from previous day:   Intake/Output this shift: Total I/O In: 1300 [I.V.:1300] Out: 115 [Blood:115]  Physical exam the patient is alert and pleasant. He is moving all 4 extremities well. There is no evidence of hematoma or shift.  Lab Results: No results for input(s): WBC, HGB, HCT, PLT in the last 72 hours. BMET No results for input(s): NA, K, CL, CO2, GLUCOSE, BUN, CREATININE, CALCIUM in the last 72 hours.  Studies/Results: Dg Cervical Spine 2-3 Views  07/18/2015  CLINICAL DATA:  C5-7 ACDF EXAM: CERVICAL SPINE - 2-3 VIEW COMPARISON:  None. FINDINGS: Initial intraoperative radiograph demonstrates a surgical probe at C5-6. Second radiograph demonstrates C5-7 ACDF. IMPRESSION: Intraoperative radiographs during C5-7 ACDF, as above. Electronically Signed   By: Charline BillsSriyesh  Krishnan M.D.   On: 07/18/2015 17:06    Assessment/Plan: The patient is doing well. I spoke with his wife.      Saige Busby D 07/18/2015, 5:39 PM

## 2015-07-18 NOTE — Anesthesia Postprocedure Evaluation (Signed)
Anesthesia Post Note  Patient: Lurene ShadowDanny T Nilan  Procedure(s) Performed: Procedure(s) (LRB): CERVICALFIVE-SIX, CERVICAL SIX-SEVEN ANTERIOR CERVICAL DECOMPRESSION/DISCECTOMY FUSION (N/A)  Patient location during evaluation: PACU Anesthesia Type: General Level of consciousness: awake and alert Pain management: pain level controlled Vital Signs Assessment: post-procedure vital signs reviewed and stable Respiratory status: spontaneous breathing, nonlabored ventilation, respiratory function stable and patient connected to nasal cannula oxygen Cardiovascular status: blood pressure returned to baseline and stable Postop Assessment: no signs of nausea or vomiting Anesthetic complications: no    Last Vitals:  Filed Vitals:   07/18/15 1207 07/18/15 1209  BP:  136/84  Pulse:  64  Temp: 36.7 C   Resp:  20    Last Pain:  Filed Vitals:   07/18/15 1725  PainSc: 1                  Cecile HearingStephen Edward Chipper [place]

## 2015-07-18 NOTE — Transfer of Care (Signed)
Immediate Anesthesia Transfer of Care Note  Patient: Darin Waters  Procedure(s) Performed: Procedure(s) with comments: CERVICALFIVE-SIX, CERVICAL SIX-SEVEN ANTERIOR CERVICAL DECOMPRESSION/DISCECTOMY FUSION (N/A) - C56 C67 anterior cervical decompression with fusion interbody prosthesis plating and bonegraft  Patient Location: PACU  Anesthesia Type:General  Level of Consciousness: awake, alert , oriented and patient cooperative  Airway & Oxygen Therapy: Patient Spontanous Breathing and Patient connected to nasal cannula oxygen  Post-op Assessment: Report given to RN and Post -op Vital signs reviewed and stable  Post vital signs: Reviewed and stable  Last Vitals:  Filed Vitals:   07/18/15 1207 07/18/15 1209  BP:  136/84  Pulse:  64  Temp: 36.7 C   Resp:  20    Complications: No apparent anesthesia complications

## 2015-07-18 NOTE — Op Note (Signed)
Brief history: The patient is a 55 year old white male who has complained of neck and arm pain consistent with a cervical radiculopathy. He has failed medical management and was worked up with a cervical MRI. This demonstrated a herniated disc at C5-6 and C6-7. I discussed the situation with the patient. We discussed the various treatment options including surgery. He has weighed the risks, benefits, and alternatives to surgery and decided to proceed with a C5-6 and C6-7 anterior cervical discectomy, fusion, and plating.  Preoperative diagnosis: C5-6 and C6-7 herniated disc, cervicalgia, cervical radiculopathy  Postoperative diagnosis: The same  Procedure: C5-6 and C6-7 Anterior cervical discectomy/decompression; C5-6 and C6-7 interbody arthrodesis with local morcellized autograft bone and Kinnex bone graft extender; insertion of interbody prosthesis at C5-6 and C6-7 (Zimmer peek interbody prosthesis); anterior cervical plating from C5-C7 with globus titanium plate  Surgeon: Dr. Delma Officer  Asst.: Dr. Barbaraann Barthel  Anesthesia: Gen. endotracheal  Estimated blood loss: 100 mL  Drains: None  Complications: None  Description of procedure: The patient was brought to the operating room by the anesthesia team. General endotracheal anesthesia was induced. A roll was placed under the patient's shoulders to keep the neck in the neutral position. The patient's anterior cervical region was then prepared with Betadine scrub and Betadine solution. Sterile drapes were applied.  The area to be incised was then injected with Marcaine with epinephrine solution. I then used a scalpel to make a transverse incision in the patient's left anterior neck. I used the Metzenbaum scissors to divide the platysmal muscle and then to dissect medial to the sternocleidomastoid muscle, jugular vein, and carotid artery. I carefully dissected down towards the anterior cervical spine identifying the esophagus and retracting it  medially. Then using Kitner swabs to clear soft tissue from the anterior cervical spine. We then inserted a bent spinal needle into the upper exposed intervertebral disc space. We then obtained intraoperative radiographs confirm our location.  I then used electrocautery to detach the medial border of the longus colli muscle bilaterally from the C5-6 and C6-7 intervertebral disc spaces. I then inserted the Caspar self-retaining retractor underneath the longus colli muscle bilaterally to provide exposure.  We then incised the intervertebral disc at C5-6. We then performed a partial intervertebral discectomy with a pituitary forceps and the Karlin curettes. I then inserted distraction screws into the vertebral bodies at C5-6. We then distracted the interspace. We then used the high-speed drill to decorticate the vertebral endplates at C5-6, to drill away the remainder of the intervertebral disc, to drill away some posterior spondylosis, and to thin out the posterior longitudinal ligament. I then incised ligament with the arachnoid knife. We then removed the ligament with a Kerrison punches undercutting the vertebral endplates and decompressing the thecal sac. We then performed foraminotomies about the bilateral C6 nerve roots. This completed the decompression at this level.  We then repeated this procedure and analogous fashion and C6-7 decompressing the thecal sac and the bilateral C7 nerve roots.  We now turned our to attention to the interbody fusion. We used the trial spacers to determine the appropriate size for the interbody prosthesis. We then pre-filled prosthesis with a combination of local morcellized autograft bone that we obtained during decompression as well as Kinnex bone graft extender. We then inserted the prosthesis into the distracted interspace at C5-6 and C6-7. We then removed the distraction screws. There was a good snug fit of the prosthesis in the interspace.  Having completed the  fusion we now turned  attention to the anterior spinal instrumentation. We used the high-speed drill to drill away some anterior spondylosis at the disc spaces so that the plate lay down flat. We selected the appropriate length titanium anterior cervical plate. We laid it along the anterior aspect of the vertebral bodies from C5-C7. We then drilled 14 mm holes at C5, C6 and C7. We then secured the plate to the vertebral bodies by placing two 14 mm self-tapping screws at C5, C6 and C7. We then obtained intraoperative radiograph. The demonstrating good position of the instrumentation. We therefore secured the screws the plate the locking each cam. This completed the instrumentation.  We then obtained hemostasis using bipolar electrocautery. We irrigated the wound out with bacitracin solution. We then removed the retractor. We inspected the esophagus for any damage. There was none apparent. We then reapproximated patient's platysmal muscle with interrupted 3-0 Vicryl suture. We then reapproximated the subcutaneous tissue with interrupted 3-0 Vicryl suture. The skin was reapproximated with Steri-Strips and benzoin. The wound was then covered with bacitracin ointment. A sterile dressing was applied. The drapes were removed. Patient was subsequently extubated by the anesthesia team and transported to the post anesthesia care unit in stable condition. All sponge instrument and needle counts were reportedly correct at the end of this case.

## 2015-07-18 NOTE — Anesthesia Preprocedure Evaluation (Addendum)
Anesthesia Evaluation  Patient identified by MRN, date of birth, ID band Patient awake    Reviewed: Allergy & Precautions, NPO status , Patient's Chart, lab work & pertinent test results  Airway Mallampati: II  TM Distance: >3 FB Neck ROM: Full    Dental  (+) Dental Advisory Given, Edentulous Upper   Pulmonary COPD, Current Smoker (90 pack years),    Pulmonary exam normal breath sounds clear to auscultation       Cardiovascular hypertension, Pt. 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  Reproductive/Obstetrics                          Anesthesia Physical Anesthesia Plan  ASA: III  Anesthesia Plan: General   Post-op Pain Management:    Induction: Intravenous  Airway Management Planned: Oral ETT  Additional Equipment:   Intra-op Plan:   Post-operative Plan: Extubation in OR  Informed Consent: I have reviewed the patients History and Physical, chart, labs and discussed the procedure including the risks, benefits and alternatives for the proposed anesthesia with the patient or authorized representative who has indicated his/her understanding and acceptance.   Dental advisory given  Plan Discussed with: CRNA  Anesthesia Plan Comments: (Risks/benefits of general anesthesia discussed with patient including risk of damage to teeth, lips, gum, and tongue,  nausea/vomiting, allergic reactions to medications, and the possibility of heart attack, stroke and death.  All patient questions answered.  Patient wishes to proceed.)       Anesthesia Quick Evaluation

## 2015-07-18 NOTE — Anesthesia Postprocedure Evaluation (Signed)
Anesthesia Post Note  Patient: Darin Waters  Procedure(s) Performed: Procedure(s) (LRB): CERVICALFIVE-SIX, CERVICAL SIX-SEVEN ANTERIOR CERVICAL DECOMPRESSION/DISCECTOMY FUSION (N/A)  Patient location during evaluation: PACU Anesthesia Type: General Level of consciousness: awake and alert Pain management: pain level controlled Vital Signs Assessment: post-procedure vital signs reviewed and stable Respiratory status: spontaneous breathing, nonlabored ventilation, respiratory function stable and patient connected to nasal cannula oxygen Cardiovascular status: blood pressure returned to baseline and stable Postop Assessment: no signs of nausea or vomiting Anesthetic complications: no    Last Vitals:  Filed Vitals:   07/18/15 1207 07/18/15 1209  BP:  136/84  Pulse:  64  Temp: 36.7 C   Resp:  20    Last Pain:  Filed Vitals:   07/18/15 1725  PainSc: 1                  Stephen Edward Turk      

## 2015-07-18 NOTE — H&P (Signed)
Subjective: The patient is a 55 year old white male who has complained of neck and left arm pain consistent with a cervical radiculopathy/myelopathy. He has failed medical management and was worked up with a cervical MRI. This demonstrated disc degeneration, spondylosis, stenosis, etc. at C5-6 and C6-7. I discussed the various treatment options with the patient including surgery. He has weighed the risks, benefits, and alternatives to surgery and decided proceed with a C5-6 and C6-7 anterior cervical discectomy, fusion, and plating.   Past Medical History  Diagnosis Date  . Diabetes mellitus without complication (HCC)   . Hypertension     Past Surgical History  Procedure Laterality Date  . No past surgeries    . Colonoscopy    . Rectal polypectomy  4-5 yrs ago    No Known Allergies  Social History  Substance Use Topics  . Smoking status: Current Every Day Smoker -- 2.00 packs/day for 45 years  . Smokeless tobacco: Never Used  . Alcohol Use: No    History reviewed. No pertinent family history. Prior to Admission medications   Medication Sig Start Date End Date Taking? Authorizing Provider  amLODipine-benazepril (LOTREL) 5-20 MG capsule Take 1 capsule by mouth daily.   Yes Historical Provider, MD  aspirin 81 MG tablet Take 81 mg by mouth daily.   Yes Historical Provider, MD  citalopram (CELEXA) 10 MG tablet Take 10 mg by mouth daily.   Yes Historical Provider, MD  glimepiride (AMARYL) 2 MG tablet Take 2 mg by mouth daily with breakfast.   Yes Historical Provider, MD  metFORMIN (GLUCOPHAGE) 500 MG tablet Take 1,000 mg by mouth 2 (two) times daily with a meal.    Yes Historical Provider, MD  nabumetone (RELAFEN) 750 MG tablet Take 750 mg by mouth 2 (two) times daily.    Yes Historical Provider, MD  oxyCODONE-acetaminophen (PERCOCET/ROXICET) 5-325 MG tablet Take 1 tablet by mouth every 4 (four) hours as needed for moderate pain or severe pain.    Yes Historical Provider, MD  simvastatin  (ZOCOR) 20 MG tablet Take 20 mg by mouth daily.   Yes Historical Provider, MD  oxyCODONE-acetaminophen (PERCOCET) 5-325 MG tablet Take 2 tablets by mouth every 6 (six) hours as needed for moderate pain or severe pain. Patient not taking: Reported on 07/03/2015 04/16/15   Emily FilbertJonathan E Williams, MD  predniSONE (STERAPRED UNI-PAK 21 TAB) 10 MG (21) TBPK tablet Take 1 tablet (10 mg total) by mouth daily. Take steroid taper pack as directed Patient not taking: Reported on 07/03/2015 04/16/15   Emily FilbertJonathan E Williams, MD     Review of Systems  Positive ROS: As above  All other systems have been reviewed and were otherwise negative with the exception of those mentioned in the HPI and as above.  Objective: Vital signs in last 24 hours: Temp:  [98 F (36.7 C)] 98 F (36.7 C) (04/19 1207) Pulse Rate:  [64] 64 (04/19 1209) Resp:  [20] 20 (04/19 1209) BP: (136)/(84) 136/84 mmHg (04/19 1209) SpO2:  [99 %] 99 % (04/19 1209) Weight:  [91.037 kg (200 lb 11.2 oz)] 91.037 kg (200 lb 11.2 oz) (04/19 1209)  General Appearance: Alert, cooperative, no distress, Head: Normocephalic, without obvious abnormality, atraumatic Eyes: PERRL, conjunctiva/corneas clear, EOM's intact,    Ears: Normal  Throat: Normal  Neck: Supple, symmetrical, trachea midline, no adenopathy; thyroid: No enlargement/tenderness/nodules; no carotid bruit or JVD Back: Symmetric, no curvature, ROM normal, no CVA tenderness Lungs: Clear to auscultation bilaterally, respirations unlabored Heart: Regular rate and rhythm, no  murmur, rub or gallop Abdomen: Soft, non-tender,, no masses, no organomegaly Extremities: Extremities normal, atraumatic, no cyanosis or edema Pulses: 2+ and symmetric all extremities Skin: Skin color, texture, turgor normal, no rashes or lesions  NEUROLOGIC:   Mental status: alert and oriented, no aphasia, good attention span, Fund of knowledge/ memory ok Motor Exam - grossly normal Sensory Exam - grossly  normal Reflexes:  Coordination - grossly normal Gait - grossly normal Balance - grossly normal Cranial Nerves: I: smell Not tested  II: visual acuity  OS: Normal  OD: Normal   II: visual fields Full to confrontation  II: pupils Equal, round, reactive to light  III,VII: ptosis None  III,IV,VI: extraocular muscles  Full ROM  V: mastication Normal  V: facial light touch sensation  Normal  V,VII: corneal reflex  Present  VII: facial muscle function - upper  Normal  VII: facial muscle function - lower Normal  VIII: hearing Not tested  IX: soft palate elevation  Normal  IX,X: gag reflex Present  XI: trapezius strength  5/5  XI: sternocleidomastoid strength 5/5  XI: neck flexion strength  5/5  XII: tongue strength  Normal    Data Review Lab Results  Component Value Date   WBC 9.8 07/10/2015   HGB 14.7 07/10/2015   HCT 43.5 07/10/2015   MCV 96.0 07/10/2015   PLT 215 07/10/2015   Lab Results  Component Value Date   NA 138 07/10/2015   K 4.1 07/10/2015   CL 103 07/10/2015   CO2 24 07/10/2015   BUN 13 07/10/2015   CREATININE 0.94 07/10/2015   GLUCOSE 142* 07/10/2015   No results found for: INR, PROTIME  Assessment/Plan: C5-6 and C6-7 herniated disc, cervicalgia, cervical radiculopathy, cervical stenosis: I have discussed the situation with the patient. I have reviewed his imaging studies with him and pointed out the abnormalities. We have discussed the various treatment options including surgery. I have described the C5-6 and C6-7 intracervical discectomy, fusion, and plating. I have shown him surgical models. We have discussed the risks, benefits, alternatives, and likelihood of achieving our goals with surgery. I have answered all the patient's questions. He has decided to proceed with surgery.   Diarra Kos D 07/18/2015 2:17 PM

## 2015-07-19 ENCOUNTER — Encounter (HOSPITAL_COMMUNITY): Payer: Self-pay | Admitting: Neurosurgery

## 2015-07-19 DIAGNOSIS — M50122 Cervical disc disorder at C5-C6 level with radiculopathy: Secondary | ICD-10-CM | POA: Diagnosis not present

## 2015-07-19 LAB — GLUCOSE, CAPILLARY
Glucose-Capillary: 197 mg/dL — ABNORMAL HIGH (ref 65–99)
Glucose-Capillary: 227 mg/dL — ABNORMAL HIGH (ref 65–99)
Glucose-Capillary: 243 mg/dL — ABNORMAL HIGH (ref 65–99)
Glucose-Capillary: 264 mg/dL — ABNORMAL HIGH (ref 65–99)

## 2015-07-19 MED ORDER — DOCUSATE SODIUM 100 MG PO CAPS: 100.0000 mg | ORAL_CAPSULE | Freq: Two times a day (BID) | ORAL | Status: DC

## 2015-07-19 MED ORDER — OXYCODONE-ACETAMINOPHEN 10-325 MG PO TABS: 1.0000 | ORAL_TABLET | ORAL | Status: DC | PRN

## 2015-07-19 MED ORDER — CYCLOBENZAPRINE HCL 10 MG PO TABS: 10.0000 mg | ORAL_TABLET | Freq: Three times a day (TID) | ORAL | Status: DC | PRN

## 2015-07-19 NOTE — Progress Notes (Signed)
Patient is discharged from room 5C17 at this time. Alert and in stable condition. IV site d/c'd and instructions read to patient with understanding verbalized. Left unit via wheelchair with all belongings at side.

## 2015-07-19 NOTE — Care Management Note (Signed)
Case Management Note  Patient Details  Name: Darin Waters MRN: [id number] Date of Birth: 01/17/1961  Subjective/Objective:    Pt s/p C 5-7  ACDF. He is from home with spouse and family.             Action/Plan: CM is following for discharge needs.   Expected Discharge Date:                  Expected Discharge Plan:  Home/Self Care  In-House Referral:     Discharge planning Services     Post Acute Care Choice:    Choice offered to:     DME Arranged:    DME Agency:     HH Arranged:    HH Agency:     Status of Service:  In process, will continue to follow  Medicare Important Message Given:    Date Medicare IM Given:    Medicare IM give by:    Date Additional Medicare IM Given:    Additional Medicare Important Message give by:     If discussed at Long Length of Stay Meetings, dates discussed:    Additional Comments:  Kermit BaloKelli F Giavanna Kang, RN 07/19/2015, 10:29 AM

## 2015-07-19 NOTE — Discharge Summary (Signed)
Physician Discharge Summary  Patient ID: Darin Waters MRN: [id number] DOB/AGE: 57/05/1960 57 y.o.  Admit date: 07/18/2015 Discharge date: 07/19/2015  Admission Diagnoses:C5-6 and C6-7 herniated disc, cervicalgia, cervical radiculopathy  Discharge Diagnoses: The same Active Problems:   Cervical herniated disc   Discharged Condition: good  Hospital Course: I performed a C5-6 and C6-7 anterior cervical discectomy, fusion, and plating on the patient on 07/18/2015. The surgery went well.  The patient's postoperative course was unremarkable. On postoperative day #1 the patient requested discharge home. The patient, and his wife, were given written and oral discharge instructions. All their questions were answered.  Consults: None Significant Diagnostic Studies: None Treatments: C5-6 and C6-7 anterior cervical discectomy, fusion, and plating. Discharge Exam: Blood pressure 130/72, pulse 89, temperature 98.4 F (36.9 C), temperature source Oral, resp. rate 20, height 5\' 6"  (1.676 m), weight 91.354 kg (201 lb 6.4 oz), SpO2 92 %. The patient is alert and pleasant. He looks well. He is moving all 4 extremities well. His dressing is clean and dry. There is no evidence of hematoma or shift.  Disposition: Home  Discharge Instructions    Call MD for:  difficulty breathing, headache or visual disturbances    Complete by:  As directed      Call MD for:  extreme fatigue    Complete by:  As directed      Call MD for:  hives    Complete by:  As directed      Call MD for:  persistant dizziness or light-headedness    Complete by:  As directed      Call MD for:  persistant nausea and vomiting    Complete by:  As directed      Call MD for:  redness, tenderness, or signs of infection (pain, swelling, redness, odor or green/yellow discharge around incision site)    Complete by:  As directed      Call MD for:  severe uncontrolled pain    Complete by:  As directed      Call MD for:  temperature  >100.4    Complete by:  As directed      Diet - low sodium heart healthy    Complete by:  As directed      Discharge instructions    Complete by:  As directed   Call [phone] for a followup appointment. Take a stool softener while you are using pain medications.     Driving Restrictions    Complete by:  As directed   Do not drive for 2 weeks.     Increase activity slowly    Complete by:  As directed      Lifting restrictions    Complete by:  As directed   Do not lift more than 5 pounds. No excessive bending or twisting.     May shower / Bathe    Complete by:  As directed   He may shower after the pain she is removed 3 days after surgery. Leave the incision alone.     Remove dressing in 48 hours    Complete by:  As directed   Your stitches are under the scan and will dissolve by themselves. The Steri-Strips will fall off after you take a few showers. Do not rub back or pick at the wound, Leave the wound alone.            Medication List    STOP taking these medications        nabumetone  750 MG tablet  Commonly known as:  RELAFEN     oxyCODONE-acetaminophen 5-325 MG tablet  Commonly known as:  PERCOCET/ROXICET  Replaced by:  oxyCODONE-acetaminophen 10-325 MG tablet     predniSONE 10 MG (21) Tbpk tablet  Commonly known as:  STERAPRED UNI-PAK 21 TAB      TAKE these medications        amLODipine-benazepril 5-20 MG capsule  Commonly known as:  LOTREL  Take 1 capsule by mouth daily.     aspirin 81 MG tablet  Take 81 mg by mouth daily.     citalopram 10 MG tablet  Commonly known as:  CELEXA  Take 10 mg by mouth daily.     cyclobenzaprine 10 MG tablet  Commonly known as:  FLEXERIL  Take 1 tablet (10 mg total) by mouth 3 (three) times daily as needed.     docusate sodium 100 MG capsule  Commonly known as:  COLACE  Take 1 capsule (100 mg total) by mouth 2 (two) times daily.     glimepiride 2 MG tablet  Commonly known as:  AMARYL  Take 2 mg by mouth daily with  breakfast.     metFORMIN 500 MG tablet  Commonly known as:  GLUCOPHAGE  Take 1,000 mg by mouth 2 (two) times daily with a meal.     oxyCODONE-acetaminophen 10-325 MG tablet  Commonly known as:  PERCOCET  Take 1 tablet by mouth every 4 (four) hours as needed for pain.     simvastatin 20 MG tablet  Commonly known as:  ZOCOR  Take 20 mg by mouth daily.         SignedCristi Loron 07/19/2015, 12:36 PM

## 2015-07-25 ENCOUNTER — Other Ambulatory Visit: Payer: Self-pay | Admitting: Physical Medicine and Rehabilitation

## 2015-07-25 DIAGNOSIS — M5416 Radiculopathy, lumbar region: Secondary | ICD-10-CM

## 2015-08-10 ENCOUNTER — Ambulatory Visit
Admission: RE | Admit: 2015-08-10 | Discharge: 2015-08-10 | Disposition: A | Discharge status: Home / Self Care | Payer: BLUE CROSS/BLUE SHIELD | Source: Ambulatory Visit | Attending: Physical Medicine and Rehabilitation | Admitting: Physical Medicine and Rehabilitation

## 2015-08-10 DIAGNOSIS — M5416 Radiculopathy, lumbar region: Secondary | ICD-10-CM | POA: Diagnosis present

## 2015-08-10 DIAGNOSIS — M5126 Other intervertebral disc displacement, lumbar region: Secondary | ICD-10-CM | POA: Diagnosis not present

## 2015-08-14 DIAGNOSIS — M542 Cervicalgia: Secondary | ICD-10-CM | POA: Insufficient documentation

## 2015-08-14 DIAGNOSIS — M4310 Spondylolisthesis, site unspecified: Secondary | ICD-10-CM | POA: Insufficient documentation

## 2015-08-14 DIAGNOSIS — G8929 Other chronic pain: Secondary | ICD-10-CM | POA: Insufficient documentation

## 2015-10-26 DIAGNOSIS — M5416 Radiculopathy, lumbar region: Secondary | ICD-10-CM | POA: Insufficient documentation

## 2015-10-26 DIAGNOSIS — M48061 Spinal stenosis, lumbar region without neurogenic claudication: Secondary | ICD-10-CM | POA: Insufficient documentation

## 2015-10-29 ENCOUNTER — Other Ambulatory Visit: Payer: Self-pay | Admitting: Neurosurgery

## 2015-12-11 ENCOUNTER — Other Ambulatory Visit (HOSPITAL_COMMUNITY): Payer: BLUE CROSS/BLUE SHIELD

## 2015-12-18 ENCOUNTER — Ambulatory Visit: Payer: BLUE CROSS/BLUE SHIELD | Attending: Neurosurgery | Admitting: Physical Therapy

## 2015-12-18 ENCOUNTER — Encounter: Payer: Self-pay | Admitting: Physical Therapy

## 2015-12-18 DIAGNOSIS — M5441 Lumbago with sciatica, right side: Secondary | ICD-10-CM | POA: Diagnosis present

## 2015-12-18 DIAGNOSIS — R262 Difficulty in walking, not elsewhere classified: Secondary | ICD-10-CM | POA: Diagnosis present

## 2015-12-18 DIAGNOSIS — M5442 Lumbago with sciatica, left side: Secondary | ICD-10-CM | POA: Insufficient documentation

## 2015-12-18 NOTE — Therapy (Signed)
[facility] PHYSICAL AND SPORTS MEDICINE [address] [place], [address] Phone: [phone]   Fax:  [phone]  Physical Therapy Treatment  Patient Details  Name: Darin Waters MRN: [id number] Date of Birth: 18-May-1960 Referring Provider: Dr. Lovell Sheehan  Encounter Date: 12/18/2015      PT End of Session - 12/18/15 1013    Visit Number 1   Number of Visits 9   Date for PT Re-Evaluation 01/15/16   PT Start Time 0850   PT Stop Time 0945   PT Time Calculation (min) 55 min   Activity Tolerance Patient tolerated treatment well   Behavior During Therapy [facility] for tasks assessed/performed      Past Medical History:  Diagnosis Date  . Diabetes mellitus without complication (HCC)   . Hypertension     Past Surgical History:  Procedure Laterality Date  . ANTERIOR CERVICAL DECOMP/DISCECTOMY FUSION N/A 07/18/2015   Procedure: CERVICALFIVE-SIX, CERVICAL SIX-SEVEN ANTERIOR CERVICAL DECOMPRESSION/DISCECTOMY FUSION;  Surgeon: Tressie Stalker, MD;  Location: [facility];  Service: Neurosurgery;  Laterality: N/A;  C56 C67 anterior cervical decompression with fusion interbody prosthesis plating and bonegraft  . COLONOSCOPY    . NO PAST SURGERIES    . RECTAL POLYPECTOMY  4-5 yrs ago    There were no vitals filed for this visit.      Subjective Assessment - 12/18/15 0956    Subjective Pt reports he had ACDF of cervical spine in April 2017; he still has decreased strength in BUE. He reports having LBP for 3-4 years which he has treated with cortisone shots in the past, but it has progressively gotten worse over the last few months. He had an MRI recently and the doctor told him "a disc is out and sitting on a pinched nerve." He states that he tries to do as much as he can when at home, but if he knows it's going to hurt him, he doesn't do it. He states walking up inclines, stairs, and heavy lifting all aggravate his pain. He states he has to sit within  a few minutes of walking. He states he has n/t in BLE down to his feet. He states he has no significant changes to b/b ("it's like I got a leaky washer"). He lives and takes care of his father; he states he only has to provide minimal physical support for him. "Anything moving gets me." He states medication helps dull the pain, but otherwise it is a constant pain. He states he has not had any falls but he has had "stumbles" when outside.   Pertinent History chronic LBP, current spondylolisthesis of L4/5, chronic compression of L1 vertebrae    Limitations Lifting;Standing;Walking;House hold activities   How long can you sit comfortably? long periods of time   How long can you stand comfortably? 15 mins, "but I'm gonna lean on something"   How long can you walk comfortably? short distances and requires seated rest break   Patient Stated Goals to have less pain   Currently in Pain? Yes   Pain Score 5    Pain Location Back   Pain Orientation Right;Left   Pain Descriptors / Indicators Constant   Pain Type Chronic pain            [facility] PT Assessment - 12/18/15 0001      Assessment   Medical Diagnosis Spondylolisthesis, lumbar region   Referring Provider Dr. Lovell Sheehan   Onset Date/Surgical Date 12/18/11     Precautions  Precautions None     Restrictions   Weight Bearing Restrictions No     Balance Screen   Has the patient fallen in the past 6 months No   Has the patient had a decrease in activity level because of a fear of falling?  Yes   Is the patient reluctant to leave their home because of a fear of falling?  No     Home [employer]   Living Environment Private residence   Living Arrangements Spouse/significant other;Other relatives   Available Help at Discharge Family   Type of Home House   Home Layout One level   Home Equipment None     Prior Function   Level of Independence Independent   Vocation On disability   Leisure watch racing     Cognition   Overall Cognitive  Status Within Functional Limits for tasks assessed     REFLEXES L3: 2+ BLE S1: 2+ BLE  SENSATION  BLE light touch diminished L4-S1 diminished  PROM Bil hip IR/ER limited, IR > ER L HS length: -50 deg from vertical R HS length: -45 deg from vertical  STRENGTH (on scale of 0-5/5) Pt 5/5 strength BLE except bil HS 4/5  POSTURE Increased thoracic kyphosis, decreased lumbar lordosis, forward head posture and forward rounded shoulders in sitting   GAIT BLE throughout: increased trendelenberg, increased hip IR, increased toe out, decreased ankle DF slightly, decreased hip extension, increased lumbar lordosis  SPECIAL TESTS Slump test: neg BLE  10MWT: 10.07 seconds   Therex: BLE HS stretch with strap, 3x30-45 sec bouts each Bil bridging, 1x10; pt felt above belt line during exercise so educated pt to decrease hip extension ROM and then to raise toes up; pt reported feeling it working in "back of legs" which was appropriate Bil sidelying clamshells with RTB, 1x15 each; min verbal cues for proper technique      PT Education - 12/18/15 1013    Education provided Yes   Education Details exam findings, POC, HEP   Person(s) Educated Patient   Methods Explanation   Comprehension Verbalized understanding             PT Long Term Goals - 12/18/15 1025      PT LONG TERM GOAL #1   Title Pt will be independent with HEP to maximize overall function and decrease risk of reinjury.   Time 4   Period Weeks   Status New     PT LONG TERM GOAL #2   Title Pt will have improved Modified Oswestry score by >10 points to demonstrate improved overall function.   Baseline 48% (9/19)   Time 4   Period Weeks   Status New     PT LONG TERM GOAL #3   Title Pt will demonstrate improved HS length bil to WNL to help decreased pain and improve overall function.   Baseline L -55 deg from neutral; R -45 deg from neutral   Time 4   Period Weeks   Status New               Plan -  12/18/15 1013    Clinical Impression Statement Pt is pleasant 55 YO M who presents to therapy today with c/o chronic back pain with radicular symptoms down BLE. Pt has current spondylolisthesis fracture of L4/5, compression fracture of L1, and history of cervical spine fusion. Pt has deficits in BLE HS length, hip IR, hip ER, core strength and gait. Pt educated on importance of performing HEP and  that if he does elect to have surgery on his lower back, that it is important to have as much ROM and strength as possible to maximize recovery s/p surgery. Pt needs skilled PT intervention to maximize overall function and decrease pain.   Rehab Potential Fair   Clinical Impairments Affecting Rehab Potential chronicity of issue, current spondylolisthesis and compression fracture in lumbar spine, pain, activity tolerance   PT Frequency 2x / week   PT Duration 4 weeks   PT Treatment/Interventions ADLs/Self Care Home Management;Electrical Stimulation;Cryotherapy;Moist Heat;Gait training;Stair training;Functional mobility training;Therapeutic activities;Therapeutic exercise;Balance training;Neuromuscular re-education;Patient/family education;Manual techniques;Passive range of motion   PT Next Visit Plan hip mobs, hip strengthening   Consulted and Agree with Plan of Care Patient      Patient will benefit from skilled therapeutic intervention in order to improve the following deficits and impairments:  Abnormal gait, Decreased activity tolerance, Decreased mobility, Decreased range of motion, Difficulty walking, Hypomobility, Impaired flexibility, Improper body mechanics, Postural dysfunction, Obesity, Pain  Visit Diagnosis: Lumbago with sciatica, left side  Difficulty in walking, not elsewhere classified  Lumbago with sciatica, right side     Problem List Patient Active Problem List   Diagnosis Date Noted  . Cervical herniated disc 07/18/2015   Jac Canavan, SPT  Jac Canavan 12/18/2015, 12:02  PM  [facility] PHYSICAL AND SPORTS MEDICINE [address] [place], [address] Phone: [phone]   Fax:  [phone]  Name: SAMYAK SACKMANN MRN: [id number] Date of Birth: Mar 29, 1961

## 2015-12-18 NOTE — Patient Instructions (Signed)
[email or web address]  bil HS stretch with strap, 3x30 sec each bil bridging, 3x10 bil sidelying clamshells with RTB, 3x10  1x/day

## 2015-12-20 ENCOUNTER — Inpatient Hospital Stay: Admit: 2015-12-20 | Payer: BLUE CROSS/BLUE SHIELD | Admitting: Neurosurgery

## 2015-12-20 SURGERY — POSTERIOR LUMBAR FUSION 1 LEVEL
Anesthesia: General

## 2015-12-24 ENCOUNTER — Encounter: Payer: BLUE CROSS/BLUE SHIELD | Admitting: Physical Therapy

## 2015-12-26 ENCOUNTER — Ambulatory Visit: Payer: BLUE CROSS/BLUE SHIELD | Admitting: Physical Therapy

## 2015-12-26 DIAGNOSIS — M5442 Lumbago with sciatica, left side: Secondary | ICD-10-CM | POA: Diagnosis not present

## 2015-12-26 DIAGNOSIS — M5441 Lumbago with sciatica, right side: Secondary | ICD-10-CM

## 2015-12-26 DIAGNOSIS — R262 Difficulty in walking, not elsewhere classified: Secondary | ICD-10-CM

## 2015-12-26 NOTE — Therapy (Signed)
[place] [facility] [address], [place], [address] Phone: [phone]   Fax:  [phone]  Physical Therapy Treatment  Patient Details  Name: Darin Waters MRN: [id number] Date of Birth: 12-Nov-1960 Referring Provider: Dr. Lovell Sheehan  Encounter Date: 12/26/2015      PT End of Session - 12/26/15 0810    Visit Number 2   Number of Visits 9   Date for PT Re-Evaluation 01/15/16   PT Start Time 0808   PT Stop Time 0849   PT Time Calculation (min) 41 min   Activity Tolerance Patient tolerated treatment well   Behavior During Therapy [facility] for tasks assessed/performed      Past Medical History:  Diagnosis Date  . Diabetes mellitus without complication (HCC)   . Hypertension     Past Surgical History:  Procedure Laterality Date  . ANTERIOR CERVICAL DECOMP/DISCECTOMY FUSION N/A 07/18/2015   Procedure: CERVICALFIVE-SIX, CERVICAL SIX-SEVEN ANTERIOR CERVICAL DECOMPRESSION/DISCECTOMY FUSION;  Surgeon: Tressie Stalker, MD;  Location: [facility];  Service: Neurosurgery;  Laterality: N/A;  C56 C67 anterior cervical decompression with fusion interbody prosthesis plating and bonegraft  . COLONOSCOPY    . NO PAST SURGERIES    . RECTAL POLYPECTOMY  4-5 yrs ago    There were no vitals filed for this visit.      Subjective Assessment - 12/26/15 0810    Subjective Patient reports he was using a chainsaw to help build a garden at home on Saturday for "5-6" minutes. He reports he has had intense lower back pain radiating down into his calves since that time. He had to lay down in bed all of Sunday and reports very mild improvement since that time. He reports that when he walks any appreciable length he gets a numbness feeling in his feet.    Pertinent History chronic LBP, current spondylolisthesis of L4/5, chronic compression of L1 vertebrae    Limitations Lifting;Standing;Walking;House hold activities   How long can you sit  comfortably? long periods of time   How long can you stand comfortably? 15 mins, "but I'm gonna lean on something"   How long can you walk comfortably? short distances and requires seated rest break   Patient Stated Goals to have less pain   Currently in Pain? Yes   Pain Score --  "it ain't too bad when I'm sitting down, it gets really bad when I start to move around"   Pain Location Back   Pain Descriptors / Indicators Constant;Aching   Pain Type Chronic pain   Pain Onset More than a month ago   Pain Frequency Constant   Aggravating Factors  Bending over    Pain Relieving Factors Laying down in bed.       Calf stretching with towel as part of HEP x 8 for 5" holds   Soft tissue mobilization on lumbar flank area -- notable soft tissue restrictions around lumbar paraspinals, syptoms reported more-so on L flank area as well. Performed in sidelying. Notable reduction by therapist palpation in soft-tissue restrictions after, improved posture in gait. Patient did not report any change in symptoms.   Hamstring stretching bilaterally - x 5 for 30" holds bilaterally, notable increase in ROM throughout bouts by therapist.   Manual calf stretching x 2 minutes per side, severe restrictions into DF noted. Patient again noted to have improved posture in gait (more vertical trunk) though he reports no decrease in symptoms.  PT Education - 12/26/15 [id number]    Education provided Yes   Education Details Avoid flexion based activities, complete stretching program x 3 per day.    Person(s) Educated Patient   Methods Explanation;Demonstration;Handout   Comprehension Verbalized understanding;Returned demonstration             PT Long Term Goals - 12/18/15 1025      PT LONG TERM GOAL #1   Title Pt will be independent with HEP to maximize overall function and decrease risk of reinjury.   Time 4   Period Weeks   Status New     PT LONG TERM GOAL #2    Title Pt will have improved Modified Oswestry score by >10 points to demonstrate improved overall function.   Baseline 48% (9/19)   Time 4   Period Weeks   Status New     PT LONG TERM GOAL #3   Title Pt will demonstrate improved HS length bil to WNL to help decreased pain and improve overall function.   Baseline L -55 deg from neutral; R -45 deg from neutral   Time 4   Period Weeks   Status New               Plan - 12/26/15 0917    Clinical Impression Statement Patient reports exacerbated symptoms over the weekend due to use of chainsaw, likely with poor posture. His report is more of tightness in bilateral calves, with severely restricted DF ROM noted bilaterally. He also demonstrates soft tissue restrictions on L paraspinals and flank, noted to increase erect posture during gait after calf stretching/soft tissue mobilization though he reports little change in symptoms.    Rehab Potential Fair   Clinical Impairments Affecting Rehab Potential chronicity of issue, current spondylolisthesis and compression fracture in lumbar spine, pain, activity tolerance   PT Frequency 2x / week   PT Duration 4 weeks   PT Treatment/Interventions ADLs/Self Care Home Management;Electrical Stimulation;Cryotherapy;Moist Heat;Gait training;Stair training;Functional mobility training;Therapeutic activities;Therapeutic exercise;Balance training;Neuromuscular re-education;Patient/family education;Manual techniques;Passive range of motion   PT Next Visit Plan hip mobs, hip strengthening, calf stretching, HS stretching.    Consulted and Agree with Plan of Care Patient      Patient will benefit from skilled therapeutic intervention in order to improve the following deficits and impairments:  Abnormal gait, Decreased activity tolerance, Decreased mobility, Decreased range of motion, Difficulty walking, Hypomobility, Impaired flexibility, Improper body mechanics, Postural dysfunction, Obesity, Pain  Visit  Diagnosis: Difficulty in walking, not elsewhere classified  Lumbago with sciatica, left side  Lumbago with sciatica, right side     Problem List Patient Active Problem List   Diagnosis Date Noted  . Cervical herniated disc 07/18/2015   Kerin RansomPatrick A Mery Guadalupe, PT, DPT    12/26/2015, 1:08 PM  [place] [facility] [address] [place], [place], [address] Phone: [phone]   Fax:  [phone]  Name: Lurene ShadowDanny T Ihrig MRN: [id number] Date of Birth: 01/02/1961

## 2015-12-26 NOTE — Patient Instructions (Addendum)
Calf stretching   Soft tissue mobilization on lumbar flank area   Hamstring stretching bilaterally   Manual calf stretching

## 2016-01-01 ENCOUNTER — Ambulatory Visit: Payer: BLUE CROSS/BLUE SHIELD | Attending: Neurosurgery

## 2016-01-01 DIAGNOSIS — M5442 Lumbago with sciatica, left side: Secondary | ICD-10-CM | POA: Insufficient documentation

## 2016-01-01 DIAGNOSIS — G8929 Other chronic pain: Secondary | ICD-10-CM | POA: Insufficient documentation

## 2016-01-01 DIAGNOSIS — M5441 Lumbago with sciatica, right side: Secondary | ICD-10-CM | POA: Insufficient documentation

## 2016-01-01 DIAGNOSIS — R262 Difficulty in walking, not elsewhere classified: Secondary | ICD-10-CM | POA: Diagnosis not present

## 2016-01-01 NOTE — Therapy (Signed)
[place] [facility] [address], [place], [address] Phone: [phone]   Fax:  [phone]  Physical Therapy Treatment  Patient Details  Name: Darin Waters MRN: [id number] Date of Birth: Jul 25, 1960 Referring Provider: Dr. Lovell Sheehan  Encounter Date: 01/01/2016      PT End of Session - 01/01/16 1137    Visit Number 3   Number of Visits 9   Date for PT Re-Evaluation 01/15/16   PT Start Time 1130   PT Stop Time 1201   PT Time Calculation (min) 31 min   Activity Tolerance Patient tolerated treatment well   Behavior During Therapy [facility] for tasks assessed/performed      Past Medical History:  Diagnosis Date  . Diabetes mellitus without complication (HCC)   . Hypertension     Past Surgical History:  Procedure Laterality Date  . ANTERIOR CERVICAL DECOMP/DISCECTOMY FUSION N/A 07/18/2015   Procedure: CERVICALFIVE-SIX, CERVICAL SIX-SEVEN ANTERIOR CERVICAL DECOMPRESSION/DISCECTOMY FUSION;  Surgeon: Tressie Stalker, MD;  Location: [facility];  Service: Neurosurgery;  Laterality: N/A;  C56 C67 anterior cervical decompression with fusion interbody prosthesis plating and bonegraft  . COLONOSCOPY    . NO PAST SURGERIES    . RECTAL POLYPECTOMY  4-5 yrs ago    There were no vitals filed for this visit.      Subjective Assessment - 01/01/16 1131    Subjective Pt reports he feels a little better than last session. He states he was compliant with his HEP. He states he was able to perform some light yardwork without back pain.   Pertinent History chronic LBP, current spondylolisthesis of L4/5, chronic compression of L1 vertebrae    Limitations Lifting;Standing;Walking;House hold activities   How long can you sit comfortably? long periods of time   How long can you stand comfortably? 15 mins, "but I'm gonna lean on something"   How long can you walk comfortably? short distances and requires seated rest break   Patient  Stated Goals to have less pain   Currently in Pain? Yes   Pain Score --  mild   Pain Onset More than a month ago     Bil HS stretch with strap, 3x30-60 sec each; cues for proper technique  Bil manual hip IR mobilization with strap, 10 x 10-15 oscillations at end range each; pt reported no pain during mobilization and reported no change in pain with walking following, however, he did have improved hip ROM   Bil standing hip abd with RTB, x10, x11 each; cues to decrease toe out, L>R; pt demo fatigue and stated after second set, "now it's starting to feel like after I walk a long time"  Sit <> stand with no UE support x 10, with 4# DB x 10, with 5# DB x 10; pt demo fatigue toward last set;   Mini squats with TRX ropes, too easy and pt using increased BUE support to progressed to mini squats without UE support 2x10; pt demo proper form, but had increased BLE fatigue following. He stated after 2nd set he began to feel it in his lower back slightly; SPT educated pt that this could be due to BLE weakness      PT Education - 01/01/16 1136    Education provided Yes   Education Details exercise technique, add to HEP (see above)   Person(s) Educated Patient   Methods Explanation   Comprehension Verbalized understanding  PT Long Term Goals - 12/18/15 1025      PT LONG TERM GOAL #1   Title Pt will be independent with HEP to maximize overall function and decrease risk of reinjury.   Time 4   Period Weeks   Status New     PT LONG TERM GOAL #2   Title Pt will have improved Modified Oswestry score by >10 points to demonstrate improved overall function.   Baseline 48% (9/19)   Time 4   Period Weeks   Status New     PT LONG TERM GOAL #3   Title Pt will demonstrate improved HS length bil to WNL to help decreased pain and improve overall function.   Baseline L -55 deg from neutral; R -45 deg from neutral   Time 4   Period Weeks   Status New               Plan -  01/01/16 1248    Clinical Impression Statement Pt presented to therapy today with decreased c/o pain and radicular symptoms compared to last treatment session. His bil HS length continue to be significantly tight and his hip IR is still limited. Pt tolerated hip strengthening well this date, but he did demonstrate BLE fatigue. He reported he began to slightly feel it in his back following the last set of mini squats, but no increase in pain. Pt needs continued skilled PT intervention to maximize overall strength and function.   Rehab Potential Fair   Clinical Impairments Affecting Rehab Potential chronicity of issue, current spondylolisthesis and compression fracture in lumbar spine, pain, activity tolerance   PT Frequency 2x / week   PT Duration 4 weeks   PT Treatment/Interventions ADLs/Self Care Home Management;Electrical Stimulation;Cryotherapy;Moist Heat;Gait training;Stair training;Functional mobility training;Therapeutic activities;Therapeutic exercise;Balance training;Neuromuscular re-education;Patient/family education;Manual techniques;Passive range of motion   PT Next Visit Plan hip mobs, hip strengthening, calf stretching, HS stretching.    PT Home Exercise Plan 10/3: add mini squats and standing hip abd with RTB to current HEP   Consulted and Agree with Plan of Care Patient      Patient will benefit from skilled therapeutic intervention in order to improve the following deficits and impairments:  Abnormal gait, Decreased activity tolerance, Decreased mobility, Decreased range of motion, Difficulty walking, Hypomobility, Impaired flexibility, Improper body mechanics, Postural dysfunction, Obesity, Pain  Visit Diagnosis: Difficulty in walking, not elsewhere classified  Chronic bilateral low back pain with left-sided sciatica  Chronic bilateral low back pain with right-sided sciatica     Problem List Patient Active Problem List   Diagnosis Date Noted  . Cervical herniated disc  07/18/2015   This entire session was performed under direct supervision and direction of a licensed therapist/therapist assistant . I have personally read, edited and approve of the note as written.   Jac CanavanBrooke Wille Aubuchon, SPT Lynnea MaizesJason D Huprich PT, DPT   Huprich,Jason 01/02/2016, 10:30 AM  [place] [facility] [address] [place], [place], [address] Phone: [phone]   Fax:  [phone]  Name: Darin Waters MRN: [id number] Date of Birth: 02/25/1961

## 2016-01-01 NOTE — Patient Instructions (Signed)
[email or web address]  Mini squats & standing hip abd with RTB

## 2016-01-07 ENCOUNTER — Ambulatory Visit: Payer: BLUE CROSS/BLUE SHIELD

## 2016-01-07 ENCOUNTER — Encounter: Payer: Self-pay | Admitting: Physical Therapy

## 2016-01-07 DIAGNOSIS — G8929 Other chronic pain: Secondary | ICD-10-CM

## 2016-01-07 DIAGNOSIS — M5441 Lumbago with sciatica, right side: Secondary | ICD-10-CM

## 2016-01-07 DIAGNOSIS — M5442 Lumbago with sciatica, left side: Secondary | ICD-10-CM

## 2016-01-07 DIAGNOSIS — R262 Difficulty in walking, not elsewhere classified: Secondary | ICD-10-CM

## 2016-01-07 NOTE — Therapy (Signed)
[place] [facility] [address] [place], [address] Phone: [phone]   Fax:  [phone]  Physical Therapy Treatment  Patient Details  Name: Darin Waters MRN: [id number] Date of Birth: Mar 21, 1961 Referring Provider: Dr. Lovell Sheehan  Encounter Date: 01/07/2016      PT End of Session - 01/07/16 0946    Visit Number 4   Number of Visits 9   Date for PT Re-Evaluation 01/15/16   PT Start Time 0945   PT Stop Time 1027   PT Time Calculation (min) 42 min   Activity Tolerance Patient tolerated treatment well   Behavior During Therapy [facility] for tasks assessed/performed      Past Medical History:  Diagnosis Date  . Diabetes mellitus without complication (HCC)   . Hypertension     Past Surgical History:  Procedure Laterality Date  . ANTERIOR CERVICAL DECOMP/DISCECTOMY FUSION N/A 07/18/2015   Procedure: CERVICALFIVE-SIX, CERVICAL SIX-SEVEN ANTERIOR CERVICAL DECOMPRESSION/DISCECTOMY FUSION;  Surgeon: Tressie Stalker, MD;  Location: [facility];  Service: Neurosurgery;  Laterality: N/A;  C56 C67 anterior cervical decompression with fusion interbody prosthesis plating and bonegraft  . COLONOSCOPY    . NO PAST SURGERIES    . RECTAL POLYPECTOMY  4-5 yrs ago    There were no vitals filed for this visit.      Subjective Assessment - 01/07/16 0946    Subjective Pt reports feeling like he has a knot in his back but his pain is not any worse.    Pertinent History chronic LBP, current spondylolisthesis of L4/5, chronic compression of L1 vertebrae    Limitations Lifting;Standing;Walking;House hold activities   How long can you sit comfortably? long periods of time   How long can you stand comfortably? 15 mins, "but I'm gonna lean on something"   How long can you walk comfortably? short distances and requires seated rest break   Patient Stated Goals to have less pain   Currently in Pain? Yes   Pain Score --  mild   Pain  Onset More than a month ago     Therex:  Bird dogs with LE only x 10 each; UE and LE 2x10; cues for proper technique   Resisted lateral walking with GTB, 20 ft x 2 laps; pt reported his "back hurt" during 2nd lap which required seated rest break but did report feeling exercise in his glutes  Resisted retro walking 30 ft x 1 with GTB (too easy), 30 ftx 5 with BTB; pt reported slight increase in back pain during last lap; pt reported feeling in his glutes appropriately  Sitting on grey physioball with 5# OH press and contralateral LE elevation, 3x10 each; cues for proper technique and pt required CGA for balance  Bil single leg balance sitting on grey physioball with ball toss 3x10 each; pt had increased unsteadiness throughout and required CGA  Bil SLS on airex with chops and lifts with GTB, x 10 x 20 each way; pt had increased unsteadiness on RLE and required CGA throughout  Bil cone taps on airex x 10 each; cues for proper technique  Weight sit <> stands with 6# DB x 15      PT Education - 01/07/16 1030    Education provided Yes   Education Details maintain activity while at home as walking is good exercise for LBP   Person(s) Educated Patient   Methods Explanation   Comprehension Verbalized understanding  PT Long Term Goals - 12/18/15 1025      PT LONG TERM GOAL #1   Title Pt will be independent with HEP to maximize overall function and decrease risk of reinjury.   Time 4   Period Weeks   Status New     PT LONG TERM GOAL #2   Title Pt will have improved Modified Oswestry score by >10 points to demonstrate improved overall function.   Baseline 48% (9/19)   Time 4   Period Weeks   Status New     PT LONG TERM GOAL #3   Title Pt will demonstrate improved HS length bil to WNL to help decreased pain and improve overall function.   Baseline L -55 deg from neutral; R -45 deg from neutral   Time 4   Period Weeks   Status New               Plan -  01/07/16 1028    Clinical Impression Statement Pt continuing to make progress. His bil hip IR is not WNL and non-painful upon PROM. He tolerated hip and core stregthening well this date, though he did have slight increased LBP with hip strengthening which improved following rest break. Pt needs continued skilled PT intervention to maximize overall strength and function.   Rehab Potential Fair   Clinical Impairments Affecting Rehab Potential chronicity of issue, current spondylolisthesis and compression fracture in lumbar spine, pain, activity tolerance   PT Frequency 2x / week   PT Duration 4 weeks   PT Treatment/Interventions ADLs/Self Care Home Management;Electrical Stimulation;Cryotherapy;Moist Heat;Gait training;Stair training;Functional mobility training;Therapeutic activities;Therapeutic exercise;Balance training;Neuromuscular re-education;Patient/family education;Manual techniques;Passive range of motion   PT Next Visit Plan hip mobs, hip strengthening, calf stretching, HS stretching.    PT Home Exercise Plan 10/3: add mini squats and standing hip abd with RTB to current HEP   Consulted and Agree with Plan of Care Patient      Patient will benefit from skilled therapeutic intervention in order to improve the following deficits and impairments:  Abnormal gait, Decreased activity tolerance, Decreased mobility, Decreased range of motion, Difficulty walking, Hypomobility, Impaired flexibility, Improper body mechanics, Postural dysfunction, Obesity, Pain  Visit Diagnosis: Difficulty in walking, not elsewhere classified  Chronic bilateral low back pain with left-sided sciatica  Chronic bilateral low back pain with right-sided sciatica     Problem List Patient Active Problem List   Diagnosis Date Noted  . Cervical herniated disc 07/18/2015   This entire session was performed under direct supervision and direction of a licensed therapist/therapist assistant . I have personally read,  edited and approve of the note as written.   Jac CanavanBrooke Powell, SPT  Lynnea MaizesJason D Huprich PT, DPT   Huprich,Jason 01/07/2016, 5:00 PM  [place] [facility] [address] [place], [place], [address] Phone: [phone]   Fax:  [phone]  Name: Lurene ShadowDanny T Critz MRN: [id number] Date of Birth: 05/31/1960

## 2016-01-10 ENCOUNTER — Ambulatory Visit: Payer: BLUE CROSS/BLUE SHIELD | Admitting: Physical Therapy

## 2016-01-10 ENCOUNTER — Encounter: Payer: Self-pay | Admitting: Physical Therapy

## 2016-01-10 DIAGNOSIS — R262 Difficulty in walking, not elsewhere classified: Secondary | ICD-10-CM | POA: Diagnosis not present

## 2016-01-10 DIAGNOSIS — M5441 Lumbago with sciatica, right side: Secondary | ICD-10-CM

## 2016-01-10 DIAGNOSIS — M5442 Lumbago with sciatica, left side: Secondary | ICD-10-CM

## 2016-01-10 DIAGNOSIS — G8929 Other chronic pain: Secondary | ICD-10-CM

## 2016-01-10 NOTE — Therapy (Signed)
[place] [facility] PHYSICAL AND SPORTS MEDICINE [address] [place], [place], [address] Phone: [phone]   Fax:  [phone]  Physical Therapy Treatment  Patient Details  Name: Darin Waters MRN: [id number] Date of Birth: 06/09/1960 Referring Provider: Dr. Lovell SheehanJenkins  Encounter Date: 01/10/2016      PT End of Session - 01/10/16 1036    Visit Number 5   Number of Visits 9   Date for PT Re-Evaluation 01/15/16   PT Start Time 1031   PT Stop Time 1115   PT Time Calculation (min) 44 min   Activity Tolerance Patient tolerated treatment well   Behavior During Therapy [facility] for tasks assessed/performed      Past Medical History:  Diagnosis Date  . Diabetes mellitus without complication (HCC)   . Hypertension     Past Surgical History:  Procedure Laterality Date  . ANTERIOR CERVICAL DECOMP/DISCECTOMY FUSION N/A 07/18/2015   Procedure: CERVICALFIVE-SIX, CERVICAL SIX-SEVEN ANTERIOR CERVICAL DECOMPRESSION/DISCECTOMY FUSION;  Surgeon: Tressie StalkerJeffrey Jenkins, MD;  Location: [facility];  Service: Neurosurgery;  Laterality: N/A;  C56 C67 anterior cervical decompression with fusion interbody prosthesis plating and bonegraft  . COLONOSCOPY    . NO PAST SURGERIES    . RECTAL POLYPECTOMY  4-5 yrs ago    There were no vitals filed for this visit.      Subjective Assessment - 01/10/16 1033    Subjective Pt states that the R side of his back is aching this morning.    Pertinent History chronic LBP, current spondylolisthesis of L4/5, chronic compression of L1 vertebrae    Limitations Lifting;Standing;Walking;House hold activities   How long can you sit comfortably? long periods of time   How long can you stand comfortably? 15 mins, "but I'm gonna lean on something"   How long can you walk comfortably? short distances and requires seated rest break   Patient Stated Goals to have less pain   Currently in Pain? Yes   Pain Score --  moderate   Pain Onset More than  a month ago     THEREX: Bil HS stretch with strap, 3x60 sec each; min cues to maintain knee extension   Standing hip abd with RTB on airex, 1x20 each leg; cues for proper technique   Bil SLS on airex and OH press with 5# DB, 1x20 each; increased difficulty when balancing on RLE  Resisted side stepping with RTB, 9215ft x 5 laps; cues to decrease toe out   Leg press 55# 1x20, 65# 1x20; min cues for proper technique  Fwd/lat step ups with 2 risers x 20 each  Sitting on physioball with ball toss while balancing on 1 leg, 2x10 tosses each; CGA for balance though noted improvement from last session      PT Education - 01/10/16 1117    Education provided Yes   Education Details continue HEP   Person(s) Educated Patient   Methods Explanation   Comprehension Verbalized understanding             PT Long Term Goals - 12/18/15 1025      PT LONG TERM GOAL #1   Title Pt will be independent with HEP to maximize overall function and decrease risk of reinjury.   Time 4   Period Weeks   Status New     PT LONG TERM GOAL #2   Title Pt will have improved Modified Oswestry score by >10 points to demonstrate improved overall function.   Baseline 48% (9/19)  Time 4   Period Weeks   Status New     PT LONG TERM GOAL #3   Title Pt will demonstrate improved HS length bil to WNL to help decreased pain and improve overall function.   Baseline L -55 deg from neutral; R -45 deg from neutral   Time 4   Period Weeks   Status New               Plan - 01/10/16 1117    Clinical Impression Statement Pt continues to present to therapy with c/o bil LBP. Pt is making improvements in BLE and core strength as demo by tolerance for progressed strengthening. Pt due for progress note next week and goals will be reassessed.   Rehab Potential Fair   Clinical Impairments Affecting Rehab Potential chronicity of issue, current spondylolisthesis and compression fracture in lumbar spine, pain, activity  tolerance   PT Frequency 2x / week   PT Duration 4 weeks   PT Treatment/Interventions ADLs/Self Care Home Management;Electrical Stimulation;Cryotherapy;Moist Heat;Gait training;Stair training;Functional mobility training;Therapeutic activities;Therapeutic exercise;Balance training;Neuromuscular re-education;Patient/family education;Manual techniques;Passive range of motion   PT Next Visit Plan hip mobs, hip strengthening, calf stretching, HS stretching.    PT Home Exercise Plan 10/3: add mini squats and standing hip abd with RTB to current HEP   Consulted and Agree with Plan of Care Patient      Patient will benefit from skilled therapeutic intervention in order to improve the following deficits and impairments:  Abnormal gait, Decreased activity tolerance, Decreased mobility, Decreased range of motion, Difficulty walking, Hypomobility, Impaired flexibility, Improper body mechanics, Postural dysfunction, Obesity, Pain  Visit Diagnosis: Difficulty in walking, not elsewhere classified  Chronic bilateral low back pain with left-sided sciatica  Chronic bilateral low back pain with right-sided sciatica     Problem List Patient Active Problem List   Diagnosis Date Noted  . Cervical herniated disc 07/18/2015   Jac Canavan, SPT  Jac Canavan 01/10/2016, 11:20 AM  [place] [facility] PHYSICAL AND SPORTS MEDICINE [address], [place], [address] Phone: [phone]   Fax:  [phone]  Name: Darin Waters MRN: [id number] Date of Birth: [birth year]

## 2016-01-14 ENCOUNTER — Ambulatory Visit: Payer: BLUE CROSS/BLUE SHIELD | Admitting: Physical Therapy

## 2016-01-14 ENCOUNTER — Encounter: Payer: Self-pay | Admitting: Physical Therapy

## 2016-01-14 DIAGNOSIS — R262 Difficulty in walking, not elsewhere classified: Secondary | ICD-10-CM

## 2016-01-14 DIAGNOSIS — M5442 Lumbago with sciatica, left side: Secondary | ICD-10-CM

## 2016-01-14 DIAGNOSIS — M5441 Lumbago with sciatica, right side: Secondary | ICD-10-CM

## 2016-01-14 DIAGNOSIS — G8929 Other chronic pain: Secondary | ICD-10-CM

## 2016-01-14 NOTE — Therapy (Signed)
[place] PHYSICAL AND SPORTS MEDICINE [address] [place], [address] Phone: [phone]   Fax:  [phone]  Physical Therapy Treatment/Discharge Summary  Patient Details  Name: Darin Waters MRN: [id number] Date of Birth: [birth year] Referring Provider: Dr. Arnoldo Morale  Encounter Date: 01/14/2016      PT End of Session - 01/14/16 1036    Visit Number 6   Number of Visits 9   Date for PT Re-Evaluation 01/15/16   PT Start Time 5885   PT Stop Time 1108   PT Time Calculation (min) 33 min   Activity Tolerance Patient tolerated treatment well   Behavior During Therapy [facility] for tasks assessed/performed      Past Medical History:  Diagnosis Date  . Diabetes mellitus without complication ([place])   . Hypertension     Past Surgical History:  Procedure Laterality Date  . ANTERIOR CERVICAL DECOMP/DISCECTOMY FUSION N/A 07/18/2015   Procedure: CERVICALFIVE-SIX, CERVICAL SIX-SEVEN ANTERIOR CERVICAL DECOMPRESSION/DISCECTOMY FUSION;  Surgeon: Newman Pies, MD;  Location: [place] [facility];  Service: Neurosurgery;  Laterality: N/A;  C56 C67 anterior cervical decompression with fusion interbody prosthesis plating and bonegraft  . COLONOSCOPY    . NO PAST SURGERIES    . RECTAL POLYPECTOMY  4-5 yrs ago    There were no vitals filed for this visit.      Subjective Assessment - 01/14/16 1036    Subjective Pt states his RLE is "acting up this morning."    Pertinent History chronic LBP, current spondylolisthesis of L4/5, chronic compression of L1 vertebrae    Limitations Lifting;Standing;Walking;House hold activities   How long can you sit comfortably? long periods of time   How long can you stand comfortably? 15 mins, "but I'm gonna lean on something"   How long can you walk comfortably? short distances and requires seated rest break   Patient Stated Goals to have less pain   Currently in Pain? No/denies   Pain Onset More than a month ago       MODI: 74% perceived disability due to back pain  HS length (from 90 deg hip flexion, then knee extension): -55 on LLE  -42 on RLE  Updated HEP        PT Long Term Goals - 01/14/16 1112      PT LONG TERM GOAL #1   Title Pt will be independent with HEP to maximize overall function and decrease risk of reinjury.   Baseline partially compliant   Time 4   Period Weeks   Status Partially Met     PT LONG TERM GOAL #2   Title Pt will have improved Modified Oswestry score by >10 points to demonstrate improved overall function.   Baseline 74% (01/14/16)   Time 4   Period Weeks   Status Not Met     PT LONG TERM GOAL #3   Title Pt will demonstrate improved HS length bil to WNL to help decreased pain and improve overall function.   Baseline L -55 deg from neutral; R -42 deg from neutral   Time 4   Period Weeks   Status Not Met               Plan - 01/14/16 1113    Clinical Impression Statement SPT reassessed pt's goals this date. He reports 0% progress since starting therapy and states that he can only walk 15 mins before he has to sit and take a break due to back pain. According to his  mODI, he has 74% perceived disability due to his back pain. Due to pt's lack of progress and subjective reports that he thinks the only thing that will help him is surgery, pt will be discharged at this time. He was given an updated HEP and educated that if he does have surgery, the stronger he is going into it, the better his recovery will be after. Pt only charged for a screen this session as he was provided extensive education and only his goals were reassessed this date.   Rehab Potential Fair   Clinical Impairments Affecting Rehab Potential chronicity of issue, current spondylolisthesis and compression fracture in lumbar spine, pain, activity tolerance   PT Frequency 2x / week   PT Duration 4 weeks   PT Treatment/Interventions ADLs/Self Care Home Management;Electrical  Stimulation;Cryotherapy;Moist Heat;Gait training;Stair training;Functional mobility training;Therapeutic activities;Therapeutic exercise;Balance training;Neuromuscular re-education;Patient/family education;Manual techniques;Passive range of motion   PT Next Visit Plan hip mobs, hip strengthening, calf stretching, HS stretching.    PT Home Exercise Plan 10/3: add mini squats and standing hip abd with RTB to current HEP   Consulted and Agree with Plan of Care Patient      Patient will benefit from skilled therapeutic intervention in order to improve the following deficits and impairments:  Abnormal gait, Decreased activity tolerance, Decreased mobility, Decreased range of motion, Difficulty walking, Hypomobility, Impaired flexibility, Improper body mechanics, Postural dysfunction, Obesity, Pain  Visit Diagnosis: Difficulty in walking, not elsewhere classified  Chronic bilateral low back pain with left-sided sciatica  Chronic bilateral low back pain with right-sided sciatica     Problem List Patient Active Problem List   Diagnosis Date Noted  . Cervical herniated disc 07/18/2015   Geraldine Solar, SPT  Geraldine Solar 01/14/2016, 12:12 PM  [place] PHYSICAL AND SPORTS MEDICINE [address] [place], [address] Phone: [phone]   Fax:  [phone]  Name: CELSO GRANJA MRN: [id number] Date of Birth: 1960/07/31

## 2016-01-14 NOTE — Patient Instructions (Signed)
[email or web address]  Sit <> stands Standing hip abd with GTB Resisted side stepping with GTB Mini squats HS stretch bil bil clamshells with GTB

## 2016-01-17 ENCOUNTER — Encounter: Payer: BLUE CROSS/BLUE SHIELD | Admitting: Physical Therapy

## 2016-01-21 ENCOUNTER — Encounter: Payer: BLUE CROSS/BLUE SHIELD | Admitting: Physical Therapy

## 2016-01-23 ENCOUNTER — Other Ambulatory Visit: Payer: Self-pay | Admitting: Neurosurgery

## 2016-01-24 ENCOUNTER — Encounter: Payer: BLUE CROSS/BLUE SHIELD | Admitting: Physical Therapy

## 2016-01-24 ENCOUNTER — Ambulatory Visit
Admission: RE | Admit: 2016-01-24 | Discharge: 2016-01-24 | Disposition: A | Discharge status: Home / Self Care | Payer: BLUE CROSS/BLUE SHIELD | Source: Ambulatory Visit | Attending: Internal Medicine | Admitting: Internal Medicine

## 2016-01-24 ENCOUNTER — Other Ambulatory Visit: Payer: Self-pay | Admitting: Internal Medicine

## 2016-01-24 DIAGNOSIS — R0602 Shortness of breath: Secondary | ICD-10-CM

## 2016-01-28 ENCOUNTER — Encounter: Payer: BLUE CROSS/BLUE SHIELD | Admitting: Physical Therapy

## 2016-01-30 DIAGNOSIS — G4730 Sleep apnea, unspecified: Secondary | ICD-10-CM

## 2016-01-30 HISTORY — DX: Sleep apnea, unspecified: G47.30

## 2016-02-06 NOTE — Pre-Procedure Instructions (Signed)
Nancy FetterDanny T Rodino  02/06/2016      [facility] [phone] - [place], [place] - [address] AT [facility] [address] [place] [address] Phone: [phone] Fax: [phone]    Your procedure is scheduled on Wed, Nov 15 @ 11:45 AM  [facility] Admitting at 8:45 AM  Call this number if you have problems the morning of surgery:  [phone]   Remember:  Do not eat food or drink liquids after midnight.  Take these medicines the morning of surgery with A SIP OF WATER Citalopram(Celexa),Gabapentin(Neurontin),Pain Pill(if needed),ProAir(if needed)<Bring Your Inhaler With You>,and Symbicort<Bring Your Inhaler>             Stop taking your Aspirin along with any Vitamins or Herbal Medications. No Goody's,BC's,Aleve,Advil,Motrin,Ibuprofen,or Fish Oil.     How to Manage Your Diabetes Before and After Surgery  Why is it important to control my blood sugar before and after surgery? . Improving blood sugar levels before and after surgery helps healing and can limit problems. . A way of improving blood sugar control is eating a healthy diet by: o  Eating less sugar and carbohydrates o  Increasing activity/exercise o  Talking with your doctor about reaching your blood sugar goals . High blood sugars (greater than 180 mg/dL) can raise your risk of infections and slow your recovery, so you will need to focus on controlling your diabetes during the weeks before surgery. . Make sure that the doctor who takes care of your diabetes knows about your planned surgery including the date and location.  How do I manage my blood sugar before surgery? . Check your blood sugar at least 4 times a day, starting 2 days before surgery, to make sure that the level is not too high or low. o Check your blood sugar the morning of your surgery when you wake up and every 2 hours until you get to the Short Stay unit. . If your blood sugar is less than 70 mg/dL, you  will need to treat for low blood sugar: o Do not take insulin. o Treat a low blood sugar (less than 70 mg/dL) with  cup of clear juice (cranberry or apple), 4 glucose tablets, OR glucose gel. o Recheck blood sugar in 15 minutes after treatment (to make sure it is greater than 70 mg/dL). If your blood sugar is not greater than 70 mg/dL on recheck, call [phone] for further instructions. . Report your blood sugar to the short stay nurse when you get to Short Stay.  . If you are admitted to the hospital after surgery: o Your blood sugar will be checked by the staff and you will probably be given insulin after surgery (instead of oral diabetes medicines) to make sure you have good blood sugar levels. o The goal for blood sugar control after surgery is 80-180 mg/dL.              WHAT DO I DO ABOUT MY DIABETES MEDICATION?   Marland Kitchen. Do not take oral diabetes medicines (pills) the morning of surgery.       . The day of surgery, do not take other diabetes injectables, including Byetta (exenatide), Bydureon (exenatide ER), Victoza (liraglutide), or Trulicity (dulaglutide).  . If your CBG is greater than 220 mg/dL, you may take  of your sliding scale (correction) dose of insulin.  Other Instructions:  Reviewed and Endorsed by [facility] Patient Education Committee, August 2015   Do not wear jewelry.  Do not wear lotions, powders,colognes, or deoderant.             Men may shave face and neck.  Do not bring valuables to the hospital.  [facility] is not responsible for any belongings or valuables.  Contacts, dentures or bridgework may not be worn into surgery.  Leave your suitcase in the car.  After surgery it may be brought to your room.  For patients admitted to the hospital, discharge time will be determined by your treatment team.  Patients discharged the day of surgery will not be allowed to drive home.    [facility] - Preparing for  Surgery  Before surgery, you can play an important role.  Because skin is not sterile, your skin needs to be as free of germs as possible.  You can reduce the number of germs on you skin by washing with CHG (chlorahexidine gluconate) soap before surgery.  CHG is an antiseptic cleaner which kills germs and bonds with the skin to continue killing germs even after washing.  Please DO NOT use if you have an allergy to CHG or antibacterial soaps.  If your skin becomes reddened/irritated stop using the CHG and inform your nurse when you arrive at Short Stay.  Do not shave (including legs and underarms) for at least 48 hours prior to the first CHG shower.  You may shave your face.  Please follow these instructions carefully:   1.  Shower with CHG Soap the night before surgery and the                                morning of Surgery.  2.  If you choose to wash your hair, wash your hair first as usual with your       normal shampoo.  3.  After you shampoo, rinse your hair and body thoroughly to remove the                      Shampoo.  4.  Use CHG as you would any other liquid soap.  You can apply chg directly       to the skin and wash gently with scrungie or a clean washcloth.  5.  Apply the CHG Soap to your body ONLY FROM THE NECK DOWN.        Do not use on open wounds or open sores.  Avoid contact with your eyes,       ears, mouth and genitals (private parts).  Wash genitals (private parts)       with your normal soap.  6.  Wash thoroughly, paying special attention to the area where your surgery        will be performed.  7.  Thoroughly rinse your body with warm water from the neck down.  8.  DO NOT shower/wash with your normal soap after using and rinsing off       the CHG Soap.  9.  Pat yourself dry with a clean towel.            10.  Wear clean pajamas.            11.  Place clean sheets on your bed the night of your first shower and do not        sleep with  pets.  Day of Surgery  Do not apply  any lotions/deoderants the morning of surgery.  Please wear clean clothes to the hospital/surgery center.    Please read over the following fact sheets that you were given. Pain Booklet, Coughing and Deep Breathing, MRSA Information and Surgical Site Infection Prevention

## 2016-02-07 ENCOUNTER — Encounter (HOSPITAL_COMMUNITY)
Admission: RE | Admit: 2016-02-07 | Discharge: 2016-02-07 | Disposition: A | Discharge status: Home / Self Care | Payer: BLUE CROSS/BLUE SHIELD | Source: Ambulatory Visit | Attending: Neurosurgery | Admitting: Neurosurgery

## 2016-02-07 ENCOUNTER — Encounter (HOSPITAL_COMMUNITY): Payer: Self-pay

## 2016-02-07 DIAGNOSIS — E119 Type 2 diabetes mellitus without complications: Secondary | ICD-10-CM | POA: Insufficient documentation

## 2016-02-07 DIAGNOSIS — Z01818 Encounter for other preprocedural examination: Secondary | ICD-10-CM | POA: Insufficient documentation

## 2016-02-07 DIAGNOSIS — M4316 Spondylolisthesis, lumbar region: Secondary | ICD-10-CM | POA: Insufficient documentation

## 2016-02-07 HISTORY — DX: Depression, unspecified: F32.A

## 2016-02-07 HISTORY — DX: Gastro-esophageal reflux disease without esophagitis: K21.9

## 2016-02-07 HISTORY — DX: Sleep apnea, unspecified: G47.30

## 2016-02-07 HISTORY — DX: Personal history of urinary calculi: Z87.442

## 2016-02-07 HISTORY — DX: Unspecified osteoarthritis, unspecified site: M19.90

## 2016-02-07 HISTORY — DX: Chronic obstructive pulmonary disease, unspecified: J44.9

## 2016-02-07 HISTORY — DX: Major depressive disorder, single episode, unspecified: F32.9

## 2016-02-07 LAB — BASIC METABOLIC PANEL
Anion gap: 8 (ref 5–15)
BUN: 11 mg/dL (ref 6–20)
CO2: 25 mmol/L (ref 22–32)
Calcium: 9.5 mg/dL (ref 8.9–10.3)
Chloride: 103 mmol/L (ref 101–111)
Creatinine, Ser: 0.84 mg/dL (ref 0.61–1.24)
GFR calc Af Amer: >60 mL/min (ref >60)
GFR calc non Af Amer: >60 mL/min (ref >60)
Glucose, Bld: 183 mg/dL — ABNORMAL HIGH (ref 65–99)
Potassium: 4.3 mmol/L (ref 3.5–5.1)
Sodium: 136 mmol/L (ref 135–145)

## 2016-02-07 LAB — TYPE AND SCREEN
ABO/RH(D): A POS
Antibody Screen: NEGATIVE

## 2016-02-07 LAB — CBC
HCT: 44.5 % (ref 39.0–52.0)
Hemoglobin: 15.3 g/dL (ref 13.0–17.0)
MCH: 33.1 pg (ref 26.0–34.0)
MCHC: 34.4 g/dL (ref 30.0–36.0)
MCV: 96.3 fL (ref 78.0–100.0)
Platelets: 212 10*3/uL (ref 150–400)
RBC: 4.62 MIL/uL (ref 4.22–5.81)
RDW: 12.7 % (ref 11.5–15.5)
WBC: 9.7 10*3/uL (ref 4.0–10.5)

## 2016-02-07 LAB — GLUCOSE, CAPILLARY: Glucose-Capillary: 383 mg/dL — ABNORMAL HIGH (ref 65–99)

## 2016-02-07 LAB — SURGICAL PCR SCREEN
MRSA, PCR: NEGATIVE
Staphylococcus aureus: NEGATIVE

## 2016-02-07 LAB — ABO/RH: ABO/RH(D): A POS

## 2016-02-07 NOTE — Progress Notes (Addendum)
Pt. Seen in PAT with a Bld. Sugar of 383, denies any symptoms of hyperglycemia. Pt. Admits last p.m. He had chocolate cake, sweet tea; this a.m. He had a couple of coffees with S&L, 2 crackers&  1/2 bottle of reg. Lemonade.  Pt. Had not taken his medicine yet this a.m.  Pt. Will be seeing the PCP today, as scheduled & his wife will attend with him & they agree that this information will be shared at that visit to include the CBG this a.m.  Pt. Warned that his bld. Sugar must be in good control when he comes for surgery on the 11/ 15. Pt. Reports that he had a sleep study last week & is [facility] [place], he doesn't have the result yet. Pt. Reports that he had a cardiac cath. For a "baseline" , unsure when it was done.  Also found in the care everywhere, other cardiac studies done in 2016. Pt. Followed by Paraschos for cardiac. Pt. [facility]- [facility]augher [facility].   Pt. Denies any chest concerns today or since he had his last EKG.

## 2016-02-08 LAB — HEMOGLOBIN A1C
Hgb A1c MFr Bld: 6.8 % — ABNORMAL HIGH (ref 4.8–5.6)
Mean Plasma Glucose: 148 mg/dL

## 2016-02-08 NOTE — Progress Notes (Addendum)
Anesthesia Chart Review: Patient is a 55 year old male scheduled for L4-5 PLIF on 02/13/16 by Dr. Lovell SheehanJenkins.   History includes smoking, DM2, HTN, COPD, GERD, depression, arthritis, C5-7 ACDF 07/18/15. He [facility], but has not heard the results yet. BMI is consistent with obesity.  - PCP is Lenon OmsSarah Gauger, [facility]. She is aware of surgery plans. She is aware of his hyperglycemia 02/07/16 am after tea and cake. She and our nursing staff advised that he should keep glucose well controlled prior to surgery. His A1c is < 7.0.  - Pulmonologist is Dr. Freda MunroSaadat Khan [facility] records pending. - He was seen by cardiologist Dr. Marcina MillardAlexander Paraschos [facility] on 07/13/14 after moderate LAD and RCA atheroscloerosis for age noted on 02/14/14 chest CT (see Results Review tab). He had a normal stress echo at that time.   Meds include amlodipine-benazepril, aspirin 81 mg, Celexa, Flexeril, Nexium, Neurontin, Amaryl, Norco, metformin, Relafen, Pro-air, quinine, Zocor, Symbicort.  BP (!) 169/75   Pulse 75   Temp 36.8 C   Resp 20   Ht 5' 5.5" (1.664 m)   Wt 210 lb 14.4 oz (95.7 kg)   SpO2 98%   BMI 34.56 kg/m   04/16/15 EKG: NSR.   08/01/14 Stress Echo ([facility]; Care Everywhere): INTERPRETATION Normal Stress Echocardiogram NORMAL RIGHT VENTRICULAR SYSTOLIC FUNCTION TRIVIAL REGURGITATION NOTED (Trivial TR/PR) NO VALVULAR STENOSIS NOTED  01/24/16 CXR: FINDINGS: The heart size and mediastinal contours are within normal limits. There is no focal infiltrate, pulmonary edema, or pleural effusion. Prior lower cervical spine fixation is noted. IMPRESSION: No active cardiopulmonary disease.  Preoperative labs noted. Cr 0.84, H/H 15.3/44.5, glucose 183 (CBG was 383 after chocolate cake and sweet tea), A1c 6.8. He will get a fasting CBG on arrival.  I've asked nursing staff to bring me records from Dr. Welton FlakesKhan once received, but based on  currently available information, I am anticipating that he can proceed as planned in no new changes.  Velna Ochsllison Justyce Baby, PA-C [facility]/Anesthesiology Phone [phone] 02/08/2016 1:41 PM  Addendum: Pulmonology records received from Dr. Welton FlakesKhan. Last visit 02/07/16 with Esperanza SheetsEric Turner, PA-C. He is aware of upcoming surgery. Patient is scheduled for routine "updated" PFTs on 02/20/16. Although the formal Sleep Study results are not scanned into their system yet, his not states, "He did have PSG done that did not show significant OSA. Did not show significant desaturation as well but did show severe snoring."    Velna Ochsllison Dorrien Grunder, PA-C [facility]iology Phone [phone] 02/11/2016 9:38 AM

## 2016-02-12 MED ORDER — CEFAZOLIN SODIUM-DEXTROSE 2-4 GM/100ML-% IV SOLN
2.0000 g | INTRAVENOUS | Status: AC
  Administered 2016-02-13: 2 g via INTRAVENOUS
  Filled 2016-02-12: qty 100

## 2016-02-13 ENCOUNTER — Encounter (HOSPITAL_COMMUNITY)
Admission: RE | Disposition: A | Discharge status: Home / Self Care | Payer: Self-pay | Source: Ambulatory Visit | Attending: Neurosurgery

## 2016-02-13 ENCOUNTER — Inpatient Hospital Stay (HOSPITAL_COMMUNITY): Payer: BLUE CROSS/BLUE SHIELD | Admitting: Certified Registered Nurse Anesthetist

## 2016-02-13 ENCOUNTER — Inpatient Hospital Stay (HOSPITAL_COMMUNITY)
Admission: RE | Admit: 2016-02-13 | Discharge: 2016-02-14 | DRG: 455 | Disposition: A | Discharge status: Home / Self Care | Payer: BLUE CROSS/BLUE SHIELD | Source: Ambulatory Visit | Attending: Neurosurgery | Admitting: Neurosurgery

## 2016-02-13 ENCOUNTER — Encounter (HOSPITAL_COMMUNITY): Payer: Self-pay | Admitting: Certified Registered Nurse Anesthetist

## 2016-02-13 ENCOUNTER — Inpatient Hospital Stay (HOSPITAL_COMMUNITY): Payer: BLUE CROSS/BLUE SHIELD | Admitting: Vascular Surgery

## 2016-02-13 ENCOUNTER — Inpatient Hospital Stay (HOSPITAL_COMMUNITY): Payer: BLUE CROSS/BLUE SHIELD

## 2016-02-13 DIAGNOSIS — Z79899 Other long term (current) drug therapy: Secondary | ICD-10-CM

## 2016-02-13 DIAGNOSIS — Z981 Arthrodesis status: Secondary | ICD-10-CM | POA: Diagnosis not present

## 2016-02-13 DIAGNOSIS — G4730 Sleep apnea, unspecified: Secondary | ICD-10-CM | POA: Diagnosis present

## 2016-02-13 DIAGNOSIS — K219 Gastro-esophageal reflux disease without esophagitis: Secondary | ICD-10-CM | POA: Diagnosis present

## 2016-02-13 DIAGNOSIS — F17200 Nicotine dependence, unspecified, uncomplicated: Secondary | ICD-10-CM | POA: Diagnosis present

## 2016-02-13 DIAGNOSIS — M4316 Spondylolisthesis, lumbar region: Secondary | ICD-10-CM | POA: Diagnosis present

## 2016-02-13 DIAGNOSIS — M48061 Spinal stenosis, lumbar region without neurogenic claudication: Secondary | ICD-10-CM | POA: Diagnosis present

## 2016-02-13 DIAGNOSIS — I10 Essential (primary) hypertension: Secondary | ICD-10-CM | POA: Diagnosis present

## 2016-02-13 DIAGNOSIS — Z7982 Long term (current) use of aspirin: Secondary | ICD-10-CM | POA: Diagnosis not present

## 2016-02-13 DIAGNOSIS — Z7951 Long term (current) use of inhaled steroids: Secondary | ICD-10-CM | POA: Diagnosis not present

## 2016-02-13 DIAGNOSIS — F329 Major depressive disorder, single episode, unspecified: Secondary | ICD-10-CM | POA: Diagnosis present

## 2016-02-13 DIAGNOSIS — E119 Type 2 diabetes mellitus without complications: Secondary | ICD-10-CM | POA: Diagnosis present

## 2016-02-13 DIAGNOSIS — Z7984 Long term (current) use of oral hypoglycemic drugs: Secondary | ICD-10-CM

## 2016-02-13 DIAGNOSIS — J449 Chronic obstructive pulmonary disease, unspecified: Secondary | ICD-10-CM | POA: Diagnosis present

## 2016-02-13 DIAGNOSIS — M79606 Pain in leg, unspecified: Secondary | ICD-10-CM | POA: Diagnosis present

## 2016-02-13 DIAGNOSIS — Z419 Encounter for procedure for purposes other than remedying health state, unspecified: Secondary | ICD-10-CM

## 2016-02-13 LAB — GLUCOSE, CAPILLARY
Glucose-Capillary: 158 mg/dL — ABNORMAL HIGH (ref 65–99)
Glucose-Capillary: 128 mg/dL — ABNORMAL HIGH (ref 65–99)
Glucose-Capillary: 172 mg/dL — ABNORMAL HIGH (ref 65–99)
Glucose-Capillary: 276 mg/dL — ABNORMAL HIGH (ref 65–99)

## 2016-02-13 SURGERY — POSTERIOR LUMBAR FUSION 1 LEVEL
Anesthesia: General | Site: Back

## 2016-02-13 MED ORDER — CHLORHEXIDINE GLUCONATE CLOTH 2 % EX PADS: 6.0000 | MEDICATED_PAD | Freq: Once | CUTANEOUS | Status: DC

## 2016-02-13 MED ORDER — MENTHOL 3 MG MT LOZG
1.0000 | LOZENGE | OROMUCOSAL | Status: DC | PRN
Start: 2016-02-13 — End: 2016-02-14

## 2016-02-13 MED ORDER — CEFAZOLIN SODIUM-DEXTROSE 2-4 GM/100ML-% IV SOLN
2.0000 g | Freq: Three times a day (TID) | INTRAVENOUS | Status: AC
  Administered 2016-02-13 – 2016-02-14 (×2): 2 g via INTRAVENOUS
  Filled 2016-02-13 (×2): qty 100

## 2016-02-13 MED ORDER — ACETAMINOPHEN 650 MG RE SUPP: 650.0000 mg | RECTAL | Status: DC | PRN

## 2016-02-13 MED ORDER — LACTATED RINGERS IV SOLN
INTRAVENOUS | Status: DC
  Administered 2016-02-13: 50 mL/h via INTRAVENOUS
  Administered 2016-02-13 (×3): via INTRAVENOUS

## 2016-02-13 MED ORDER — ONDANSETRON HCL 4 MG/2ML IJ SOLN
INTRAMUSCULAR | Status: DC | PRN
  Administered 2016-02-13: 4 mg via INTRAVENOUS

## 2016-02-13 MED ORDER — MOMETASONE FURO-FORMOTEROL FUM 200-5 MCG/ACT IN AERO
2.0000 | INHALATION_SPRAY | Freq: Two times a day (BID) | RESPIRATORY_TRACT | Status: DC
  Administered 2016-02-13 – 2016-02-14 (×2): 2 via RESPIRATORY_TRACT
  Filled 2016-02-13: qty 8.8

## 2016-02-13 MED ORDER — DIAZEPAM 5 MG PO TABS
5.0000 mg | ORAL_TABLET | Freq: Four times a day (QID) | ORAL | Status: DC | PRN
  Administered 2016-02-13: 5 mg via ORAL
  Filled 2016-02-13: qty 1

## 2016-02-13 MED ORDER — LACTATED RINGERS IV SOLN: INTRAVENOUS | Status: DC

## 2016-02-13 MED ORDER — QUININE SULFATE 324 MG PO CAPS
324.0000 mg | ORAL_CAPSULE | ORAL | Status: DC
  Administered 2016-02-14: 324 mg via ORAL
  Filled 2016-02-13: qty 1

## 2016-02-13 MED ORDER — BACITRACIN ZINC 500 UNIT/GM EX OINT
TOPICAL_OINTMENT | CUTANEOUS | Status: DC | PRN
Start: 2016-02-13 — End: 2016-02-13
  Administered 2016-02-13: 1 via TOPICAL

## 2016-02-13 MED ORDER — PROPOFOL 10 MG/ML IV BOLUS
INTRAVENOUS | Status: DC | PRN
  Administered 2016-02-13: 140 mg via INTRAVENOUS

## 2016-02-13 MED ORDER — DIPHENHYDRAMINE HCL 25 MG PO CAPS
25.0000 mg | ORAL_CAPSULE | Freq: Four times a day (QID) | ORAL | Status: DC | PRN
  Administered 2016-02-13: 25 mg via ORAL
  Filled 2016-02-13: qty 1

## 2016-02-13 MED ORDER — BACITRACIN 50000 UNITS IM SOLR
INTRAMUSCULAR | Status: DC | PRN
  Administered 2016-02-13: 14:00:00

## 2016-02-13 MED ORDER — INSULIN ASPART 100 UNIT/ML ~~LOC~~ SOLN
0.0000 [IU] | Freq: Every day | SUBCUTANEOUS | Status: DC
  Administered 2016-02-13: 3 [IU] via SUBCUTANEOUS

## 2016-02-13 MED ORDER — BUPIVACAINE LIPOSOME 1.3 % IJ SUSP
20.0000 mL | Freq: Once | INTRAMUSCULAR | Status: DC
  Filled 2016-02-13: qty 20

## 2016-02-13 MED ORDER — THROMBIN 20000 UNITS EX SOLR
CUTANEOUS | Status: DC | PRN
  Administered 2016-02-13: 14:00:00 via TOPICAL

## 2016-02-13 MED ORDER — ALBUTEROL SULFATE (2.5 MG/3ML) 0.083% IN NEBU: 3.0000 mL | INHALATION_SOLUTION | Freq: Four times a day (QID) | RESPIRATORY_TRACT | Status: DC | PRN

## 2016-02-13 MED ORDER — INSULIN ASPART 100 UNIT/ML ~~LOC~~ SOLN: 0.0000 [IU] | Freq: Three times a day (TID) | SUBCUTANEOUS | Status: DC

## 2016-02-13 MED ORDER — FENTANYL CITRATE (PF) 100 MCG/2ML IJ SOLN
INTRAMUSCULAR | Status: AC
  Filled 2016-02-13: qty 2

## 2016-02-13 MED ORDER — DEXAMETHASONE SODIUM PHOSPHATE 4 MG/ML IJ SOLN
INTRAMUSCULAR | Status: DC | PRN
  Administered 2016-02-13: 10 mg via INTRAVENOUS

## 2016-02-13 MED ORDER — OXYCODONE HCL 5 MG PO TABS: 5.0000 mg | ORAL_TABLET | Freq: Once | ORAL | Status: DC | PRN

## 2016-02-13 MED ORDER — ALBUTEROL SULFATE HFA 108 (90 BASE) MCG/ACT IN AERS
INHALATION_SPRAY | RESPIRATORY_TRACT | Status: AC
  Filled 2016-02-13: qty 6.7

## 2016-02-13 MED ORDER — SIMVASTATIN 20 MG PO TABS
20.0000 mg | ORAL_TABLET | Freq: Every day | ORAL | Status: DC
  Administered 2016-02-13: 20 mg via ORAL
  Filled 2016-02-13: qty 1

## 2016-02-13 MED ORDER — METFORMIN HCL 500 MG PO TABS
1000.0000 mg | ORAL_TABLET | Freq: Two times a day (BID) | ORAL | Status: DC
  Administered 2016-02-13 – 2016-02-14 (×2): 1000 mg via ORAL
  Filled 2016-02-13 (×2): qty 2

## 2016-02-13 MED ORDER — LIDOCAINE HCL (CARDIAC) 20 MG/ML IV SOLN
INTRAVENOUS | Status: DC | PRN
  Administered 2016-02-13: 50 mg via INTRAVENOUS

## 2016-02-13 MED ORDER — ACETAMINOPHEN 325 MG PO TABS: 650.0000 mg | ORAL_TABLET | ORAL | Status: DC | PRN

## 2016-02-13 MED ORDER — VANCOMYCIN HCL 1000 MG IV SOLR
INTRAVENOUS | Status: AC
  Filled 2016-02-13: qty 1000

## 2016-02-13 MED ORDER — 0.9 % SODIUM CHLORIDE (POUR BTL) OPTIME
TOPICAL | Status: DC | PRN
  Administered 2016-02-13: 1000 mL

## 2016-02-13 MED ORDER — HYDROMORPHONE HCL 2 MG/ML IJ SOLN
INTRAMUSCULAR | Status: AC
  Filled 2016-02-13: qty 1

## 2016-02-13 MED ORDER — SUGAMMADEX SODIUM 200 MG/2ML IV SOLN
INTRAVENOUS | Status: AC
  Filled 2016-02-13: qty 2

## 2016-02-13 MED ORDER — PANTOPRAZOLE SODIUM 40 MG PO TBEC
40.0000 mg | DELAYED_RELEASE_TABLET | Freq: Every day | ORAL | Status: DC
  Administered 2016-02-13 – 2016-02-14 (×2): 40 mg via ORAL
  Filled 2016-02-13 (×2): qty 1

## 2016-02-13 MED ORDER — PHENOL 1.4 % MT LIQD
1.0000 | OROMUCOSAL | Status: DC | PRN
Start: 2016-02-13 — End: 2016-02-14

## 2016-02-13 MED ORDER — ONDANSETRON HCL 4 MG/2ML IJ SOLN: 4.0000 mg | INTRAMUSCULAR | Status: DC | PRN

## 2016-02-13 MED ORDER — MIDAZOLAM HCL 2 MG/2ML IJ SOLN
INTRAMUSCULAR | Status: AC
  Filled 2016-02-13: qty 2

## 2016-02-13 MED ORDER — EPHEDRINE SULFATE 50 MG/ML IJ SOLN
INTRAMUSCULAR | Status: DC | PRN
  Administered 2016-02-13 (×2): 10 mg via INTRAVENOUS

## 2016-02-13 MED ORDER — ALBUTEROL SULFATE HFA 108 (90 BASE) MCG/ACT IN AERS
INHALATION_SPRAY | RESPIRATORY_TRACT | Status: DC | PRN
  Administered 2016-02-13: 2 via RESPIRATORY_TRACT

## 2016-02-13 MED ORDER — OXYCODONE-ACETAMINOPHEN 5-325 MG PO TABS
1.0000 | ORAL_TABLET | ORAL | Status: DC | PRN
  Administered 2016-02-13 – 2016-02-14 (×4): 2 via ORAL
  Filled 2016-02-13 (×4): qty 2

## 2016-02-13 MED ORDER — SUGAMMADEX SODIUM 500 MG/5ML IV SOLN
INTRAVENOUS | Status: AC
  Filled 2016-02-13: qty 5

## 2016-02-13 MED ORDER — ROCURONIUM BROMIDE 100 MG/10ML IV SOLN
INTRAVENOUS | Status: DC | PRN
  Administered 2016-02-13: 60 mg via INTRAVENOUS
  Administered 2016-02-13 (×2): 20 mg via INTRAVENOUS

## 2016-02-13 MED ORDER — THROMBIN 20000 UNITS EX SOLR
CUTANEOUS | Status: AC
  Filled 2016-02-13: qty 20000

## 2016-02-13 MED ORDER — DOCUSATE SODIUM 100 MG PO CAPS
100.0000 mg | ORAL_CAPSULE | Freq: Two times a day (BID) | ORAL | Status: DC
  Administered 2016-02-13 – 2016-02-14 (×2): 100 mg via ORAL
  Filled 2016-02-13 (×2): qty 1

## 2016-02-13 MED ORDER — BUPIVACAINE LIPOSOME 1.3 % IJ SUSP
INTRAMUSCULAR | Status: DC | PRN
  Administered 2016-02-13: 20 mL

## 2016-02-13 MED ORDER — FENTANYL CITRATE (PF) 100 MCG/2ML IJ SOLN
INTRAMUSCULAR | Status: DC | PRN
  Administered 2016-02-13: 100 ug via INTRAVENOUS
  Administered 2016-02-13: 50 ug via INTRAVENOUS
  Administered 2016-02-13 (×2): 100 ug via INTRAVENOUS
  Administered 2016-02-13: 50 ug via INTRAVENOUS

## 2016-02-13 MED ORDER — ALUM & MAG HYDROXIDE-SIMETH 200-200-20 MG/5ML PO SUSP: 30.0000 mL | Freq: Four times a day (QID) | ORAL | Status: DC | PRN

## 2016-02-13 MED ORDER — FENTANYL CITRATE (PF) 100 MCG/2ML IJ SOLN
INTRAMUSCULAR | Status: AC
  Filled 2016-02-13: qty 4

## 2016-02-13 MED ORDER — OXYCODONE HCL 5 MG/5ML PO SOLN: 5.0000 mg | Freq: Once | ORAL | Status: DC | PRN

## 2016-02-13 MED ORDER — AMLODIPINE BESY-BENAZEPRIL HCL 5-20 MG PO CAPS: 1.0000 | ORAL_CAPSULE | Freq: Every day | ORAL | Status: DC

## 2016-02-13 MED ORDER — HYDROMORPHONE HCL 1 MG/ML IJ SOLN
0.2500 mg | INTRAMUSCULAR | Status: DC | PRN
  Administered 2016-02-13: 0.5 mg via INTRAVENOUS

## 2016-02-13 MED ORDER — CITALOPRAM HYDROBROMIDE 10 MG PO TABS
10.0000 mg | ORAL_TABLET | Freq: Every day | ORAL | Status: DC
  Administered 2016-02-14: 10 mg via ORAL
  Filled 2016-02-13: qty 1

## 2016-02-13 MED ORDER — HYDROCODONE-ACETAMINOPHEN 10-325 MG PO TABS: 1.0000 | ORAL_TABLET | Freq: Three times a day (TID) | ORAL | Status: DC | PRN

## 2016-02-13 MED ORDER — PROPOFOL 10 MG/ML IV BOLUS
INTRAVENOUS | Status: AC
  Filled 2016-02-13: qty 20

## 2016-02-13 MED ORDER — HYDROCODONE-ACETAMINOPHEN 5-325 MG PO TABS: 1.0000 | ORAL_TABLET | ORAL | Status: DC | PRN

## 2016-02-13 MED ORDER — BUPIVACAINE-EPINEPHRINE (PF) 0.5% -1:200000 IJ SOLN
INTRAMUSCULAR | Status: DC | PRN
  Administered 2016-02-13: 10 mL

## 2016-02-13 MED ORDER — AMLODIPINE BESYLATE 5 MG PO TABS
5.0000 mg | ORAL_TABLET | Freq: Every day | ORAL | Status: DC
  Filled 2016-02-13: qty 1

## 2016-02-13 MED ORDER — MIDAZOLAM HCL 5 MG/5ML IJ SOLN
INTRAMUSCULAR | Status: DC | PRN
  Administered 2016-02-13: 2 mg via INTRAVENOUS

## 2016-02-13 MED ORDER — ADULT MULTIVITAMIN W/MINERALS CH
1.0000 | ORAL_TABLET | Freq: Every day | ORAL | Status: DC
  Administered 2016-02-14: 1 via ORAL
  Filled 2016-02-13: qty 1

## 2016-02-13 MED ORDER — INSULIN ASPART 100 UNIT/ML ~~LOC~~ SOLN
0.0000 [IU] | Freq: Three times a day (TID) | SUBCUTANEOUS | Status: DC
Start: 2016-02-14 — End: 2016-02-14
  Administered 2016-02-14: 11 [IU] via SUBCUTANEOUS

## 2016-02-13 MED ORDER — SUGAMMADEX SODIUM 200 MG/2ML IV SOLN
INTRAVENOUS | Status: DC | PRN
  Administered 2016-02-13: 500 mg via INTRAVENOUS

## 2016-02-13 MED ORDER — GLIMEPIRIDE 2 MG PO TABS
2.0000 mg | ORAL_TABLET | Freq: Every day | ORAL | Status: DC
  Administered 2016-02-14: 2 mg via ORAL
  Filled 2016-02-13: qty 1

## 2016-02-13 MED ORDER — CYCLOBENZAPRINE HCL 10 MG PO TABS
10.0000 mg | ORAL_TABLET | Freq: Three times a day (TID) | ORAL | Status: DC | PRN
  Administered 2016-02-14: 10 mg via ORAL
  Filled 2016-02-13: qty 1

## 2016-02-13 MED ORDER — PHENYLEPHRINE HCL 10 MG/ML IJ SOLN
INTRAMUSCULAR | Status: DC | PRN
  Administered 2016-02-13: 80 ug via INTRAVENOUS

## 2016-02-13 MED ORDER — BISACODYL 10 MG RE SUPP: 10.0000 mg | Freq: Every day | RECTAL | Status: DC | PRN

## 2016-02-13 MED ORDER — VANCOMYCIN HCL 1000 MG IV SOLR
INTRAVENOUS | Status: DC | PRN
  Administered 2016-02-13: 1000 mg via TOPICAL

## 2016-02-13 MED ORDER — BACITRACIN ZINC 500 UNIT/GM EX OINT
TOPICAL_OINTMENT | CUTANEOUS | Status: AC
  Filled 2016-02-13: qty 28.35

## 2016-02-13 MED ORDER — BENAZEPRIL HCL 20 MG PO TABS
20.0000 mg | ORAL_TABLET | Freq: Every day | ORAL | Status: DC
  Administered 2016-02-14: 20 mg via ORAL
  Filled 2016-02-13: qty 1

## 2016-02-13 MED ORDER — MORPHINE SULFATE (PF) 4 MG/ML IV SOLN
1.0000 mg | INTRAVENOUS | Status: DC | PRN
  Administered 2016-02-13: 4 mg via INTRAVENOUS
  Filled 2016-02-13: qty 1

## 2016-02-13 MED ORDER — GABAPENTIN 300 MG PO CAPS
300.0000 mg | ORAL_CAPSULE | Freq: Two times a day (BID) | ORAL | Status: DC
  Administered 2016-02-13 – 2016-02-14 (×2): 300 mg via ORAL
  Filled 2016-02-13 (×2): qty 1

## 2016-02-13 SURGICAL SUPPLY — 66 items
BAG DECANTER FOR FLEXI CONT (MISCELLANEOUS) ×3 IMPLANT
BENZOIN TINCTURE PRP APPL 2/3 (GAUZE/BANDAGES/DRESSINGS) ×3 IMPLANT
BLADE CLIPPER SURG (BLADE) IMPLANT
BUR MATCHSTICK NEURO 3.0 LAGG (BURR) ×3 IMPLANT
BUR PRECISION FLUTE 6.0 (BURR) ×3 IMPLANT
CANISTER SUCT 3000ML PPV (MISCELLANEOUS) ×3 IMPLANT
CAP REVERE LOCKING (Cap) ×12 IMPLANT
CARTRIDGE OIL MAESTRO DRILL (MISCELLANEOUS) ×1 IMPLANT
CLOSURE WOUND 1/2 X4 (GAUZE/BANDAGES/DRESSINGS) ×1
CONT SPEC 4OZ CLIKSEAL STRL BL (MISCELLANEOUS) ×3 IMPLANT
COVER BACK TABLE 60X90IN (DRAPES) ×3 IMPLANT
COVER TABLE BACK 60X90 (DRAPES) ×3 IMPLANT
DIFFUSER DRILL AIR PNEUMATIC (MISCELLANEOUS) ×3 IMPLANT
DRAPE C-ARM 42X72 X-RAY (DRAPES) ×6 IMPLANT
DRAPE HALF SHEET 40X57 (DRAPES) ×3 IMPLANT
DRAPE LAPAROTOMY 100X72X124 (DRAPES) ×3 IMPLANT
DRAPE POUCH INSTRU U-SHP 10X18 (DRAPES) ×3 IMPLANT
DRAPE SURG 17X23 STRL (DRAPES) ×12 IMPLANT
ELECT BLADE 4.0 EZ CLEAN MEGAD (MISCELLANEOUS) ×3
ELECT REM PT RETURN 9FT ADLT (ELECTROSURGICAL) ×3
ELECTRODE BLDE 4.0 EZ CLN MEGD (MISCELLANEOUS) ×1 IMPLANT
ELECTRODE REM PT RTRN 9FT ADLT (ELECTROSURGICAL) ×1 IMPLANT
EVACUATOR 1/8 PVC DRAIN (DRAIN) IMPLANT
GAUZE SPONGE 4X4 12PLY STRL (GAUZE/BANDAGES/DRESSINGS) ×3 IMPLANT
GAUZE SPONGE 4X4 16PLY XRAY LF (GAUZE/BANDAGES/DRESSINGS) ×3 IMPLANT
GLOVE BIO SURGEON STRL SZ8 (GLOVE) ×9 IMPLANT
GLOVE BIO SURGEON STRL SZ8.5 (GLOVE) ×6 IMPLANT
GLOVE BIOGEL PI IND STRL 6.5 (GLOVE) ×1 IMPLANT
GLOVE BIOGEL PI INDICATOR 6.5 (GLOVE) ×2
GLOVE EXAM NITRILE LRG STRL (GLOVE) IMPLANT
GLOVE EXAM NITRILE XL STR (GLOVE) IMPLANT
GLOVE EXAM NITRILE XS STR PU (GLOVE) IMPLANT
GLOVE SURG SS PI 6.5 STRL IVOR (GLOVE) ×12 IMPLANT
GLOVE SURG SS PI 7.0 STRL IVOR (GLOVE) ×3 IMPLANT
GOWN STRL REUS W/ TWL LRG LVL3 (GOWN DISPOSABLE) ×1 IMPLANT
GOWN STRL REUS W/ TWL XL LVL3 (GOWN DISPOSABLE) ×3 IMPLANT
GOWN STRL REUS W/TWL 2XL LVL3 (GOWN DISPOSABLE) IMPLANT
GOWN STRL REUS W/TWL LRG LVL3 (GOWN DISPOSABLE) ×2
GOWN STRL REUS W/TWL XL LVL3 (GOWN DISPOSABLE) ×6
KIT BASIN OR (CUSTOM PROCEDURE TRAY) ×3 IMPLANT
KIT ROOM TURNOVER OR (KITS) ×3 IMPLANT
NEEDLE HYPO 21X1.5 SAFETY (NEEDLE) IMPLANT
NEEDLE HYPO 22GX1.5 SAFETY (NEEDLE) ×3 IMPLANT
NS IRRIG 1000ML POUR BTL (IV SOLUTION) ×3 IMPLANT
OIL CARTRIDGE MAESTRO DRILL (MISCELLANEOUS) ×3
PACK LAMINECTOMY NEURO (CUSTOM PROCEDURE TRAY) ×3 IMPLANT
PAD ARMBOARD 7.5X6 YLW CONV (MISCELLANEOUS) ×9 IMPLANT
PATTIES SURGICAL .5 X.5 (GAUZE/BANDAGES/DRESSINGS) ×3 IMPLANT
PATTIES SURGICAL .5 X1 (DISPOSABLE) IMPLANT
ROD CURVED REVERE 6.35X50MM (Rod) ×6 IMPLANT
SCREW 7.5X50MM (Screw) ×12 IMPLANT
SPACER ALTERA 10X31-15 (Spacer) ×3 IMPLANT
SPONGE LAP 4X18 X RAY DECT (DISPOSABLE) IMPLANT
SPONGE NEURO XRAY DETECT 1X3 (DISPOSABLE) IMPLANT
SPONGE SURGIFOAM ABS GEL 100 (HEMOSTASIS) ×3 IMPLANT
STRIP BIOACTIVE 20CC 25X100X8 (Miscellaneous) ×3 IMPLANT
STRIP CLOSURE SKIN 1/2X4 (GAUZE/BANDAGES/DRESSINGS) ×2 IMPLANT
SUT VIC AB 1 CT1 18XBRD ANBCTR (SUTURE) ×1 IMPLANT
SUT VIC AB 1 CT1 8-18 (SUTURE) ×2
SUT VIC AB 2-0 CP2 18 (SUTURE) ×3 IMPLANT
SYRINGE 20CC LL (MISCELLANEOUS) ×3 IMPLANT
TAPE CLOTH SURG 4X10 WHT LF (GAUZE/BANDAGES/DRESSINGS) ×3 IMPLANT
TOWEL OR 17X24 6PK STRL BLUE (TOWEL DISPOSABLE) ×3 IMPLANT
TOWEL OR 17X26 10 PK STRL BLUE (TOWEL DISPOSABLE) ×3 IMPLANT
TRAY FOLEY W/METER SILVER 16FR (SET/KITS/TRAYS/PACK) ×3 IMPLANT
WATER STERILE IRR 1000ML POUR (IV SOLUTION) ×3 IMPLANT

## 2016-02-13 NOTE — Progress Notes (Signed)
Subjective:  The patient is somewhat but arousable.  Objective: Vital signs in last 24 hours: Temp:  [98.6 F (37 C)] 98.6 F (37 C) (11/15 0910) Pulse Rate:  [58] 58 (11/15 0910) Resp:  [20] 20 (11/15 0910) BP: (163)/(72) 163/72 (11/15 0910) SpO2:  [94 %] 94 % (11/15 0910) Weight:  [95.3 kg (210 lb)] 95.3 kg (210 lb) (11/15 0910)  Intake/Output from previous day: No intake/output data recorded. Intake/Output this shift: Total I/O In: 2000 [I.V.:2000] Out: 500 [Urine:200; Blood:300]  Physical exam the patient is somnolent but arousable. He is moving his lower extremities well.  Lab Results: No results for input(s): WBC, HGB, HCT, PLT in the last 72 hours. BMET No results for input(s): NA, K, CL, CO2, GLUCOSE, BUN, CREATININE, CALCIUM in the last 72 hours.  Studies/Results: Dg Lumbar Spine 2-3 Views  Result Date: 02/13/2016 CLINICAL DATA:  Posterior fusion of L4-5. EXAM: DG C-ARM 61-120 MIN; LUMBAR SPINE - 2-3 VIEW FLUOROSCOPY TIME:  32 seconds. COMPARISON:  Radiographs of same day. FINDINGS: Two intraoperative fluoroscopic images of the lower lumbar spine demonstrate the patient be status post posterior fusion of L4-5 with bilateral intrapedicular screw placement and interbody fusion. Good alignment of vertebral bodies is noted. IMPRESSION: Status post surgical posterior fusion of L4-5. Electronically Signed   By: Lupita RaiderJames  Green Jr, M.D.   On: 02/13/2016 14:58   Dg Lumbar Spine 1 View  Result Date: 02/13/2016 CLINICAL DATA:  Posterior lumbar fusion intraoperative x-ray EXAM: LUMBAR SPINE - 1 VIEW COMPARISON:  None. FINDINGS: Single cross-table lateral x-ray of the lumbar spine. Posterior tissue retractors are present. Blunt tip metallic probe with the tip projecting over the inferior articulating facet of L3. Grade 1 anterolisthesis of L4 on L5. Chronic L1 vertebral body compression fracture. IMPRESSION: Posterior tissue retractors are present. Blunt tip metallic probe with the tip  projecting over the inferior articulating facet of L3 Electronically Signed   By: Elige KoHetal  Patel   On: 02/13/2016 13:03   Dg C-arm 1-60 Min  Result Date: 02/13/2016 CLINICAL DATA:  Posterior fusion of L4-5. EXAM: DG C-ARM 61-120 MIN; LUMBAR SPINE - 2-3 VIEW FLUOROSCOPY TIME:  32 seconds. COMPARISON:  Radiographs of same day. FINDINGS: Two intraoperative fluoroscopic images of the lower lumbar spine demonstrate the patient be status post posterior fusion of L4-5 with bilateral intrapedicular screw placement and interbody fusion. Good alignment of vertebral bodies is noted. IMPRESSION: Status post surgical posterior fusion of L4-5. Electronically Signed   By: Lupita RaiderJames  Green Jr, M.D.   On: 02/13/2016 14:58    Assessment/Plan: The patient is doing well.  LOS: 0 days     Jazilyn Siegenthaler D 02/13/2016, 3:30 PM

## 2016-02-13 NOTE — Anesthesia Postprocedure Evaluation (Signed)
Anesthesia Post Note  Patient: Darin FetterDanny T Waters  Procedure(s) Performed: Procedure(s) (LRB): POSTERIOR LUMBAR INTERBODY FUSION,INTERBODY PROSTHESIS,POSTERIO LATERAL ARTHRODESIS,POSTERIOR NON-SEGMENTAL INSTRUMENTATION LUMBAR FOUR-FIVE (N/A)  Patient location during evaluation: PACU Anesthesia Type: General Level of consciousness: awake Pain management: pain level controlled Vital Signs Assessment: post-procedure vital signs reviewed and stable Respiratory status: spontaneous breathing Cardiovascular status: stable Postop Assessment: no signs of nausea or vomiting Anesthetic complications: no    Last Vitals:  Vitals:   02/13/16 1624 02/13/16 1630  BP:    Pulse: 86 83  Resp: 16 15  Temp:      Last Pain:  Vitals:   02/13/16 1624  TempSrc:   PainSc: 8                  Nakshatra Klose

## 2016-02-13 NOTE — Anesthesia Preprocedure Evaluation (Signed)
Anesthesia Evaluation  Patient identified by MRN, date of birth, ID band Patient awake    Reviewed: Allergy & Precautions, NPO status , Patient's Chart, lab work & pertinent test results  Airway Mallampati: II  TM Distance: >3 FB Neck ROM: Full    Dental  (+) Edentulous Upper, Missing,    Pulmonary sleep apnea , COPD,  COPD inhaler, Current Smoker,     + wheezing      Cardiovascular hypertension, Pt. on medications (-) angina(-) Past MI and (-) CHF  Rhythm:Regular     Neuro/Psych PSYCHIATRIC DISORDERS Depression    GI/Hepatic Neg liver ROS, GERD  Medicated and Controlled,  Endo/Other  diabetes, Type 2, Oral Hypoglycemic AgentsMorbid obesity  Renal/GU negative Renal ROS     Musculoskeletal  (+) Arthritis ,   Abdominal   Peds  Hematology negative hematology ROS (+)   Anesthesia Other Findings   Reproductive/Obstetrics                             Anesthesia Physical Anesthesia Plan  ASA: III  Anesthesia Plan: General   Post-op Pain Management:    Induction: Intravenous  Airway Management Planned: Oral ETT  Additional Equipment: None  Intra-op Plan:   Post-operative Plan: Extubation in OR  Informed Consent: I have reviewed the patients History and Physical, chart, labs and discussed the procedure including the risks, benefits and alternatives for the proposed anesthesia with the patient or authorized representative who has indicated his/her understanding and acceptance.   Dental advisory given  Plan Discussed with: Surgeon and CRNA  Anesthesia Plan Comments:         Anesthesia Quick Evaluation

## 2016-02-13 NOTE — Anesthesia Procedure Notes (Signed)
Procedure Name: Intubation Date/Time: 02/13/2016 12:10 PM Performed by: Reine JustFLOWERS, Domenic Schoenberger T Pre-anesthesia Checklist: Patient identified, Emergency Drugs available, Suction available, Patient being monitored and Timeout performed Patient Re-evaluated:Patient Re-evaluated prior to inductionOxygen Delivery Method: Simple face mask and Circle system utilized Preoxygenation: Pre-oxygenation with 100% oxygen Intubation Type: IV induction Ventilation: Mask ventilation without difficulty Laryngoscope Size: Miller and 3 Grade View: Grade I Tube type: Oral Number of attempts: 1 Airway Equipment and Method: Patient positioned with wedge pillow and Stylet Placement Confirmation: ETT inserted through vocal cords under direct vision,  positive ETCO2 and breath sounds checked- equal and bilateral Secured at: 21 cm Tube secured with: Tape Dental Injury: Teeth and Oropharynx as per pre-operative assessment

## 2016-02-13 NOTE — Op Note (Signed)
Brief history: The patient is a 55 year old white male who is complained of back, buttock and leg pain . He has failed medical management he was worked up with a lumbar MRI and lumbar x-rays which demonstrated an L4-5 spondylolisthesis. I discussed the various treatment options with the patient including surgery. He has weighed the risks, benefits, and alternative surgery and decided proceed with an L4-5 decompression, instrumentation, and fusion.  Preoperative diagnosis: L4-5 spondylolisthesis, Degenerative disc disease, spinal stenosis ; lumbago; lumbar radiculopathy  Postoperative diagnosis: The same   Procedure: L4-5 bilateral Laminotomy/foraminotomies to decompress the bilateral L4 and L5 nerve roots(the work required to do this was in addition to the work required to do the posterior lumbar interbody fusion because of the patient's spinal stenosis, facet arthropathy. Etc. requiring a wide decompression of the nerve roots.); L4-5 transforaminal lumbar interbody fusion with local morselized autograft bone and Kinnex graft extender; insertion of interbody prosthesis at L4-5 (globus peek expandable interbody prosthesis); posterior nonsegmental instrumentation from L4 to L5 with globus titanium pedicle screws and rods; posterior lateral arthrodesis at L4-5 with local morselized autograft bone and Kinnex bone graft extender.  Surgeon: Dr. Delma OfficerJeff Gini Caputo  Asst.: Dr. Marikay Alaravid Jones  Anesthesia: Gen. endotracheal  Estimated blood loss: 250 mL  Drains: None  Complications: None  Description of procedure: The patient was brought to the operating room by the anesthesia team. General endotracheal anesthesia was induced. The patient was turned to the prone position on the Wilson frame. The patient's lumbosacral region was then prepared with Betadine scrub and Betadine solution. Sterile drapes were applied.  I then injected the area to be incised with Marcaine with epinephrine solution. I then used the  scalpel to make a linear midline incision over the L4-5 interspace. I then used electrocautery to perform a bilateral subperiosteal dissection exposing the spinous process and lamina of L4 and L5. We then obtained intraoperative radiograph to confirm our location. We then inserted the Verstrac retractor to provide exposure.  I began the decompression by using the high speed drill to perform laminotomies at L4-5 bilaterally. We then used the Kerrison punches to widen the laminotomy and removed the ligamentum flavum at L4-5. We used the Kerrison punches to remove the medial facets at L4-5 bilaterally. We performed wide foraminotomies about the bilateral L4 and L5 nerve roots completing the decompression.  We now turned our attention to the posterior lumbar interbody fusion. I used a scalpel to incise the intervertebral disc at L4-5 bilaterally. I then performed a partial intervertebral discectomy at L4-5 bilaterally using the pituitary forceps. We prepared the vertebral endplates at L4-5 bilaterally for the fusion by removing the soft tissues with the curettes. We then used the trial spacers to pick the appropriate sized interbody prosthesis. We prefilled his prosthesis with a combination of local morselized autograft bone that we obtained during the decompression as well as Kinnex bone graft extender. We inserted the prefilled prosthesis into the interspace at L4-5, we then expanded the prosthesis. There was a good snug fit of the prosthesis in the interspace. We then filled and the remainder of the intervertebral disc space with local morselized autograft bone and Kinnex. This completed the posterior lumbar interbody arthrodesis.  We now turned attention to the instrumentation. Under fluoroscopic guidance we cannulated the bilateral L4 and L5 pedicles with the bone probe. We then removed the bone probe. We then tapped the pedicle with a 0.5 millimeter tap. We then removed the tap. We probed inside the tapped  pedicle with a  ball probe to rule out cortical breaches. We then inserted a 7.5 x 50 millimeter pedicle screw into the L4 and L5 pedicles bilaterally under fluoroscopic guidance. We then palpated along the medial aspect of the pedicles to rule out cortical breaches. There were none. The nerve roots were not injured. We then connected the unilateral pedicle screws with a lordotic rod. We compressed the construct and secured the rod in place with the caps. We then tightened the caps appropriately. This completed the instrumentation from L4-5.  We now turned our attention to the posterior lateral arthrodesis at L4-5. We used the high-speed drill to decorticate the remainder of the facets, pars, transverse process at L4-5. We then applied a combination of local morselized autograft bone and Kinnex bone graft extender over these decorticated posterior lateral structures. This completed the posterior lateral arthrodesis.  We then obtained hemostasis using bipolar electrocautery. We irrigated the wound out with bacitracin solution. We inspected the thecal sac and nerve roots and noted they were well decompressed. We then removed the retractor. We placed a medium Hemovac drain in the epidural space and tunneled out through separate stab wound. We reapproximated patient's thoracolumbar fascia with interrupted #1 Vicryl suture. We reapproximated patient's subcutaneous tissue with interrupted 2-0 Vicryl suture. The reapproximated patient's skin with Steri-Strips and benzoin. The wound was then coated with bacitracin ointment. A sterile dressing was applied. The drapes were removed. The patient was subsequently returned to the supine position where they were extubated by the anesthesia team. He was then transported to the post anesthesia care unit in stable condition. All sponge instrument and needle counts were reportedly correct at the end of this case.

## 2016-02-13 NOTE — Transfer of Care (Signed)
Immediate Anesthesia Transfer of Care Note  Patient: Darin FetterDanny T Harder  Procedure(s) Performed: Procedure(s): POSTERIOR LUMBAR INTERBODY FUSION,INTERBODY PROSTHESIS,POSTERIO LATERAL ARTHRODESIS,POSTERIOR NON-SEGMENTAL INSTRUMENTATION LUMBAR FOUR-FIVE (N/A)  Patient Location: PACU  Anesthesia Type:General  Level of Consciousness: awake, alert  and oriented  Airway & Oxygen Therapy: Patient Spontanous Breathing and Patient connected to nasal cannula oxygen  Post-op Assessment: Report given to RN and Patient moving all extremities X 4  Post vital signs: Reviewed and stable  Last Vitals:  Vitals:   02/13/16 0910  BP: (!) 163/72  Pulse: (!) 58  Resp: 20  Temp: 37 C    Last Pain:  Vitals:   02/13/16 0957  TempSrc:   PainSc: 4       Patients Stated Pain Goal: 2 (02/13/16 0957)  Complications: No apparent anesthesia complications

## 2016-02-13 NOTE — H&P (Signed)
Subjective: Patient is a 55 year old white male who has complained of back, buttock and leg pain. He has failed medical management and was worked up with a lumbar MRI. This demonstrated an L4-5 spondylolisthesis with spinal stenosis. I discussed the situation with patient. We discussed the various treatment options including surgery. He has weighed the risks, benefits, and alternative surgery decided proceed with an L4-5 decompression, instrumentation, and fusion.   Past Medical History:  Diagnosis Date  . Arthritis    DDD- lumbar , thumbs - both hands   . COPD (chronic obstructive pulmonary disease) (HCC)   . Depression   . Diabetes mellitus without complication (HCC)   . GERD (gastroesophageal reflux disease)   . History of kidney stones 1990's    lithotripsy done   . Hypertension   . Sleep apnea 01/30/2016   [facility] [place], results not avail to pt. yet    Past Surgical History:  Procedure Laterality Date  . ANTERIOR CERVICAL DECOMP/DISCECTOMY FUSION N/A 07/18/2015   Procedure: CERVICALFIVE-SIX, CERVICAL SIX-SEVEN ANTERIOR CERVICAL DECOMPRESSION/DISCECTOMY FUSION;  Surgeon: Tressie StalkerJeffrey Dave Mergen, MD;  Location: [facility];  Service: Neurosurgery;  Laterality: N/A;  C56 C67 anterior cervical decompression with fusion interbody prosthesis plating and bonegraft  . COLONOSCOPY    . RECTAL POLYPECTOMY  4-5 yrs ago    No Known Allergies  Social History  Substance Use Topics  . Smoking status: Current Every Day Smoker    Packs/day: 2.50    Years: 45.00  . Smokeless tobacco: Never Used  . Alcohol use No    History reviewed. No pertinent family history. Prior to Admission medications   Medication Sig Start Date End Date Taking? Authorizing Provider  amLODipine-benazepril (LOTREL) 5-20 MG capsule Take 1 capsule by mouth daily before breakfast.    Yes Historical Provider, MD  aspirin 81 MG tablet Take 81 mg by mouth daily.   Yes Historical Provider, MD  citalopram (CELEXA) 10  MG tablet Take 10 mg by mouth daily before breakfast.    Yes Historical Provider, MD  esomeprazole (NEXIUM) 20 MG capsule Take 20 mg by mouth daily before breakfast.   Yes Historical Provider, MD  gabapentin (NEURONTIN) 300 MG capsule Take 1 capsule by mouth 2 (two) times daily. 11/26/15  Yes Historical Provider, MD  glimepiride (AMARYL) 2 MG tablet Take 2 mg by mouth daily with breakfast.   Yes Historical Provider, MD  HYDROcodone-acetaminophen (NORCO) 10-325 MG tablet Take 1 tablet by mouth every 8 (eight) hours as needed for moderate pain.  01/21/16  Yes Historical Provider, MD  metFORMIN (GLUCOPHAGE) 500 MG tablet Take 1,000 mg by mouth 2 (two) times daily with a meal.    Yes Historical Provider, MD  Multiple Vitamins-Minerals (MULTIVITAMIN WITH MINERALS) tablet Take 1 tablet by mouth daily.   Yes Historical Provider, MD  nabumetone (RELAFEN) 750 MG tablet Take 1 tablet by mouth 2 (two) times daily. 11/06/15  Yes Historical Provider, MD  quiNINE (QUALAQUIN) 324 MG capsule Take 1 capsule by mouth every other day.  11/13/15  Yes Historical Provider, MD  simvastatin (ZOCOR) 20 MG tablet Take 20 mg by mouth daily at 6 PM.    Yes Historical Provider, MD  SYMBICORT 160-4.5 MCG/ACT inhaler Take 1 puff by mouth 2 (two) times daily. 01/24/16  Yes Historical Provider, MD  cyclobenzaprine (FLEXERIL) 10 MG tablet Take 1 tablet (10 mg total) by mouth 3 (three) times daily as needed. 07/19/15   Tressie StalkerJeffrey Nealie Mchatton, MD  docusate sodium (COLACE) 100 MG capsule Take  1 capsule (100 mg total) by mouth 2 (two) times daily. Patient taking differently: Take 100 mg by mouth 2 (two) times daily as needed for mild constipation.  07/19/15   Tressie StalkerJeffrey Kymberley Raz, MD  PROAIR HFA 108 [phone](90 Base) MCG/ACT inhaler Take 2 puffs by mouth every 6 (six) hours as needed for wheezing or shortness of breath.  01/24/16   Historical Provider, MD     Review of Systems  Positive ROS: As above  All other systems have been reviewed and were otherwise  negative with the exception of those mentioned in the HPI and as above.  Objective: Vital signs in last 24 hours: Temp:  [98.6 F (37 C)] 98.6 F (37 C) (11/15 0910) Pulse Rate:  [58] 58 (11/15 0910) Resp:  [20] 20 (11/15 0910) BP: (163)/(72) 163/72 (11/15 0910) SpO2:  [94 %] 94 % (11/15 0910) Weight:  [95.3 kg (210 lb)] 95.3 kg (210 lb) (11/15 0910)  General Appearance: Alert, cooperative, no distress, Head: Normocephalic, without obvious abnormality, atraumatic Eyes: PERRL, conjunctiva/corneas clear, EOM's intact,    Ears: Normal  Throat: Normal  Neck: Supple, symmetrical, trachea midline, no adenopathy; thyroid: No enlargement/tenderness/nodules; no carotid bruit or JVD Back: Symmetric, no curvature, ROM normal, no CVA tenderness Lungs: Clear to auscultation bilaterally, respirations unlabored Heart: Regular rate and rhythm, no murmur, rub or gallop Abdomen: Soft, non-tender,, no masses, no organomegaly Extremities: Extremities normal, atraumatic, no cyanosis or edema Pulses: 2+ and symmetric all extremities Skin: Skin color, texture, turgor normal, no rashes or lesions  NEUROLOGIC:   Mental status: alert and oriented, no aphasia, good attention span, Fund of knowledge/ memory ok Motor Exam - grossly normal Sensory Exam - grossly normal Reflexes:  Coordination - grossly normal Gait - grossly normal Balance - grossly normal Cranial Nerves: I: smell Not tested  II: visual acuity  OS: Normal  OD: Normal   II: visual fields Full to confrontation  II: pupils Equal, round, reactive to light  III,VII: ptosis None  III,IV,VI: extraocular muscles  Full ROM  V: mastication Normal  V: facial light touch sensation  Normal  V,VII: corneal reflex  Present  VII: facial muscle function - upper  Normal  VII: facial muscle function - lower Normal  VIII: hearing Not tested  IX: soft palate elevation  Normal  IX,X: gag reflex Present  XI: trapezius strength  5/5  XI:  sternocleidomastoid strength 5/5  XI: neck flexion strength  5/5  XII: tongue strength  Normal    Data Review Lab Results  Component Value Date   WBC 9.7 02/07/2016   HGB 15.3 02/07/2016   HCT 44.5 02/07/2016   MCV 96.3 02/07/2016   PLT 212 02/07/2016   Lab Results  Component Value Date   NA 136 02/07/2016   K 4.3 02/07/2016   CL 103 02/07/2016   CO2 25 02/07/2016   BUN 11 02/07/2016   CREATININE 0.84 02/07/2016   GLUCOSE 183 (H) 02/07/2016   No results found for: INR, PROTIME  Assessment/Plan: L4-5 spondylolisthesis, spinal stenosis, lumbago, lumbar radiculopathy: I have discussed the situation with the patient and reviewed his imaging studies with him. We have discussed the various treatment options including surgery. I have described the surgical treatment option of an L4-5 decompression, instrumentation, and fusion. I have described the surgery to him. I have shown him surgical models. We have discussed the risks, benefits, alternatives, expected postoperative course, and likelihood of achieving her goals with surgery. I have answered all the patient's questions. He has decided  to proceed with surgery.   Selby Foisy D 02/13/2016 11:29 AM

## 2016-02-14 LAB — CBC
HCT: 40.8 % (ref 39.0–52.0)
Hemoglobin: 14 g/dL (ref 13.0–17.0)
MCH: 33.2 pg (ref 26.0–34.0)
MCHC: 34.3 g/dL (ref 30.0–36.0)
MCV: 96.7 fL (ref 78.0–100.0)
Platelets: 219 10*3/uL (ref 150–400)
RBC: 4.22 MIL/uL (ref 4.22–5.81)
RDW: 12.5 % (ref 11.5–15.5)
WBC: 20.2 10*3/uL — ABNORMAL HIGH (ref 4.0–10.5)

## 2016-02-14 LAB — BASIC METABOLIC PANEL
Anion gap: 9 (ref 5–15)
BUN: 14 mg/dL (ref 6–20)
CO2: 26 mmol/L (ref 22–32)
Calcium: 9.4 mg/dL (ref 8.9–10.3)
Chloride: 102 mmol/L (ref 101–111)
Creatinine, Ser: 1.07 mg/dL (ref 0.61–1.24)
GFR calc Af Amer: >60 mL/min (ref >60)
GFR calc non Af Amer: >60 mL/min (ref >60)
Glucose, Bld: 212 mg/dL — ABNORMAL HIGH (ref 65–99)
Potassium: 4.6 mmol/L (ref 3.5–5.1)
Sodium: 137 mmol/L (ref 135–145)

## 2016-02-14 LAB — GLUCOSE, CAPILLARY: Glucose-Capillary: 293 mg/dL — ABNORMAL HIGH (ref 65–99)

## 2016-02-14 MED ORDER — CYCLOBENZAPRINE HCL 10 MG PO TABS: 10.0000 mg | ORAL_TABLET | Freq: Three times a day (TID) | ORAL | 1 refills | Status: DC | PRN

## 2016-02-14 MED ORDER — DOCUSATE SODIUM 100 MG PO CAPS: 100.0000 mg | ORAL_CAPSULE | Freq: Two times a day (BID) | ORAL | 0 refills | Status: DC

## 2016-02-14 MED ORDER — OXYCODONE-ACETAMINOPHEN 10-325 MG PO TABS: 1.0000 | ORAL_TABLET | ORAL | 0 refills | Status: DC | PRN

## 2016-02-14 MED FILL — Sodium Chloride IV Soln 0.9%: INTRAVENOUS | Qty: 1000 | Status: AC

## 2016-02-14 MED FILL — Heparin Sodium (Porcine) Inj 1000 Unit/ML: INTRAMUSCULAR | Qty: 30 | Status: AC

## 2016-02-14 NOTE — Evaluation (Signed)
Physical Therapy Evaluation Patient Details Name: Darin Waters: [id number] DOB: 09/02/1960 Today's Date: 02/14/2016   History of Present Illness  Pt is a 55 y/o male who presents s/p L4-L5 posterior lumbar fusion on 02/13/16. PMH includes cervical surgery in April 2017.  Clinical Impression  Patient evaluated by Physical Therapy with no further acute PT needs identified. All education has been completed and the patient has no further questions. At the time of PT eval pt was able to perform transfers and ambulation with gross supervision for safety. Increased cueing required for safety and maintenance of precautions. See below for any follow-up Physical Therapy or equipment needs. PT is signing off. Thank you for this referral.     Follow Up Recommendations No PT follow up;Supervision for mobility/OOB    Equipment Recommendations  None recommended by PT    Recommendations for Other Services       Precautions / Restrictions Precautions Precautions: Fall;Back Precaution Booklet Issued: Yes (comment) Precaution Comments: Reviewed handout and pt was cued for precautions during functional mobility.  Required Braces or Orthoses: Spinal Brace Spinal Brace: Lumbar corset;Applied in sitting position Restrictions Weight Bearing Restrictions: No      Mobility  Bed Mobility               General bed mobility comments: Pt sitting up in recliner upon PT arrival.   Transfers Overall transfer level: Needs assistance Equipment used: None Transfers: Sit to/from Stand Sit to Stand: Supervision         General transfer comment: No assist required.  Ambulation/Gait Ambulation/Gait assistance: Supervision Ambulation Distance (Feet): 350 Feet Assistive device: None Gait Pattern/deviations: Step-through pattern;Decreased stride length Gait velocity: Decreased Gait velocity interpretation: Below normal speed for age/gender General Gait Details: Appears somewhat unsteady at  times but did not require assistance and never demonstrated an actual LOB.  Stairs Stairs: Yes Stairs assistance: Supervision Stair Management: One rail Left;Alternating pattern;Forwards Number of Stairs: 10 General stair comments: VC's for sequencing and safety.  Wheelchair Mobility    Modified Rankin (Stroke Patients Only)       Balance Overall balance assessment: Needs assistance Sitting-balance support: Feet supported;No upper extremity supported Sitting balance-Leahy Scale: Good     Standing balance support: No upper extremity supported;During functional activity Standing balance-Leahy Scale: Fair                               Pertinent Vitals/Pain Pain Assessment: Faces Faces Pain Scale: Hurts little more Pain Location: Back Pain Descriptors / Indicators: Operative site guarding;Discomfort Pain Intervention(s): Limited activity within patient's tolerance;Monitored during session;Repositioned    Home Living Family/patient expects to be discharged to:: Private residence Living Arrangements: Spouse/significant other;Parent Available Help at Discharge: Family;Available 24 hours/day (for first week) Type of Home: House Home Access: Stairs to enter;Ramped entrance Entrance Stairs-Rails: Right;Left;Can reach both Entrance Stairs-Number of Steps: 2 Home Layout: One level Home Equipment: Shower seat - built in;Grab bars - toilet;Grab bars - tub/shower;Hand held shower head;Cane - single point      Prior Function Level of Independence: Independent         Comments: 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 House is handicap accessible.      Hand Dominance        Extremity/Trunk Assessment   Upper Extremity Assessment: Defer to OT evaluation           Lower Extremity Assessment: Generalized weakness  Cervical / Trunk Assessment: Other exceptions  Communication   Communication: No difficulties   Cognition Arousal/Alertness: Awake/alert Behavior During Therapy: WFL for tasks assessed/performed Overall Cognitive Status: Within Functional Limits for tasks assessed                      General Comments      Exercises     Assessment/Plan    PT Assessment Patent does not need any further PT services  PT Problem List            PT Treatment Interventions      PT Goals (Current goals can be found in the Care Plan section)  Acute Rehab PT Goals PT Goal Formulation: All assessment and education complete, DC therapy    Frequency     Barriers to discharge        Co-evaluation               End of Session Equipment Utilized During Treatment: Gait belt;Back brace Activity Tolerance: Patient tolerated treatment well Patient left: in chair;with call bell/phone within reach Nurse Communication: Mobility status         Time: [id number] PT Time Calculation (min) (ACUTE ONLY): 16 min   Charges:   PT Evaluation $PT Eval Moderate Complexity: 1 Procedure     PT G Codes:        Marylynn PearsonLaura D Britton Bera 02/14/2016, 11:44 AM   Conni SlipperLaura Syla Devoss, PT, DPT Acute Rehabilitation Services Pager: [phone]

## 2016-02-14 NOTE — Discharge Instructions (Signed)

## 2016-02-14 NOTE — Progress Notes (Signed)
Pt doing well. Pt and wife given D/C instructions with Rx's, verbal understanding was provided. Pt's incision is clean and dry with no sign of infection. Pt's IV was removed prior to D/C. Pt D/C'd home via walking @ 0915 per MD order. Pt is stable @ D/C and has no other needs at this time. Dellis FilbertAshley Alllred, RN

## 2016-02-14 NOTE — Discharge Summary (Cosign Needed)
Physician Discharge Summary  Patient ID: Darin Waters MRN: [id number] DOB/AGE: 57/05/1960 57 y.o.  Admit date: 02/13/2016 Discharge date: 02/14/2016  Admission Diagnoses:L4-5 spondylolisthesis, lumbago, lumbar radiculopathy, facet arthropathy  Discharge Diagnoses: The same Active Problems:   Spondylolisthesis of lumbar region   Discharged Condition: good  Hospital Course: I performed an L4-5 decompression, instrumentation, and fusion on the patient on 02/13/2016. The surgery went well.  The patient's postoperative course was unremarkable. On postoperative day #1 he requested discharge to home. He was given written and oral discharge instructions. All his questions were answered.  Consults: Physical therapy Significant Diagnostic Studies: None Treatments: L4-5 decompression, instrumentation, and fusion Discharge Exam: Blood pressure (!) 150/75, pulse 82, temperature 99.6 F (37.6 C), temperature source Oral, resp. rate 20, weight 95.3 kg (210 lb), SpO2 96 %. The patient is alert and pleasant. He looks well. He is ambulating well. His strength is grossly normal in his lower extremities.  Disposition: Home  Discharge Instructions    Call MD for:  difficulty breathing, headache or visual disturbances    Complete by:  As directed    Call MD for:  extreme fatigue    Complete by:  As directed    Call MD for:  hives    Complete by:  As directed    Call MD for:  persistant dizziness or light-headedness    Complete by:  As directed    Call MD for:  persistant nausea and vomiting    Complete by:  As directed    Call MD for:  redness, tenderness, or signs of infection (pain, swelling, redness, odor or green/yellow discharge around incision site)    Complete by:  As directed    Call MD for:  severe uncontrolled pain    Complete by:  As directed    Call MD for:  temperature >100.4    Complete by:  As directed    Diet - low sodium heart healthy    Complete by:  As directed     Discharge instructions    Complete by:  As directed    Call [phone] for a followup appointment. Take a stool softener while you are using pain medications.   Driving Restrictions    Complete by:  As directed    Do not drive for 2 weeks.   Increase activity slowly    Complete by:  As directed    Lifting restrictions    Complete by:  As directed    Do not lift more than 5 pounds. No excessive bending or twisting.   May shower / Bathe    Complete by:  As directed    He may shower after the pain she is removed 3 days after surgery. Leave the incision alone.   Remove dressing in 48 hours    Complete by:  As directed    Your stitches are under the scan and will dissolve by themselves. The Steri-Strips will fall off after you take a few showers. Do not rub back or pick at the wound, Leave the wound alone.       Medication List    STOP taking these medications   HYDROcodone-acetaminophen 10-325 MG tablet Commonly known as:  NORCO   nabumetone 750 MG tablet Commonly known as:  RELAFEN     TAKE these medications   amLODipine-benazepril 5-20 MG capsule Commonly known as:  LOTREL Take 1 capsule by mouth daily before breakfast.   aspirin 81 MG tablet Take 81 mg by mouth daily.  citalopram 10 MG tablet Commonly known as:  CELEXA Take 10 mg by mouth daily before breakfast.   cyclobenzaprine 10 MG tablet Commonly known as:  FLEXERIL Take 1 tablet (10 mg total) by mouth 3 (three) times daily as needed. What changed:  Another medication with the same name was added. Make sure you understand how and when to take each.   cyclobenzaprine 10 MG tablet Commonly known as:  FLEXERIL Take 1 tablet (10 mg total) by mouth 3 (three) times daily as needed for muscle spasms. What changed:  You were already taking a medication with the same name, and this prescription was added. Make sure you understand how and when to take each.   docusate sodium 100 MG capsule Commonly known as:   COLACE Take 1 capsule (100 mg total) by mouth 2 (two) times daily. What changed:  when to take this  reasons to take this   docusate sodium 100 MG capsule Commonly known as:  COLACE Take 1 capsule (100 mg total) by mouth 2 (two) times daily. What changed:  You were already taking a medication with the same name, and this prescription was added. Make sure you understand how and when to take each.   esomeprazole 20 MG capsule Commonly known as:  NEXIUM Take 20 mg by mouth daily before breakfast.   gabapentin 300 MG capsule Commonly known as:  NEURONTIN Take 1 capsule by mouth 2 (two) times daily.   glimepiride 2 MG tablet Commonly known as:  AMARYL Take 2 mg by mouth daily with breakfast.   metFORMIN 500 MG tablet Commonly known as:  GLUCOPHAGE Take 1,000 mg by mouth 2 (two) times daily with a meal.   multivitamin with minerals tablet Take 1 tablet by mouth daily.   oxyCODONE-acetaminophen 10-325 MG tablet Commonly known as:  PERCOCET Take 1 tablet by mouth every 4 (four) hours as needed for pain.   PROAIR HFA 108 (90 Base) MCG/ACT inhaler Generic drug:  albuterol Take 2 puffs by mouth every 6 (six) hours as needed for wheezing or shortness of breath.   quiNINE 324 MG capsule Commonly known as:  QUALAQUIN Take 1 capsule by mouth every other day.   simvastatin 20 MG tablet Commonly known as:  ZOCOR Take 20 mg by mouth daily at 6 PM.   SYMBICORT 160-4.5 MCG/ACT inhaler Generic drug:  budesonide-formoterol Take 1 puff by mouth 2 (two) times daily.      Follow-up Information    Cristi LoronJENKINS,Kismet Facemire D, MD Follow up.   Specialty:  Neurosurgery Contact information: [address] [place] [address] [phone]        Cristi LoronJENKINS,Kessa Fairbairn D, MD .   Specialty:  Neurosurgery Contact information: [address] [place] [address] [phone]           Signed: Cristi LoronJENKINS,Leovanni Bjorkman D 02/14/2016, 10:36 AM

## 2016-09-28 IMAGING — MR MR LUMBAR SPINE W/O CM
4 of 5 series · 24 of 48 positions shown · non-contrast
Comparison: MRI lumbar spine 01/31/2014.

CLINICAL DATA: Low back and left leg pain, chronic. Symptoms have
worsened over the past 4-5 months. No recent injury. Subsequent
encounter.

EXAM:
MRI LUMBAR SPINE WITHOUT CONTRAST
TECHNIQUE: Multiplanar, multisequence MR imaging of the lumbar spine was
performed. No intravenous contrast was administered.

[Series 2: T2 · sagittal · 4.0mm · 0.81mm/px · 6 of 15 slices shown (1 of 2)]
[im 1/15]
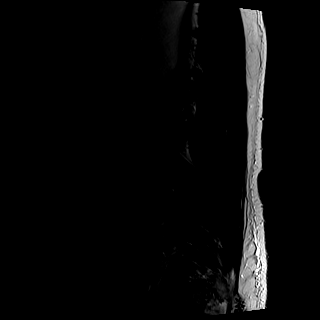
[im 3/15]
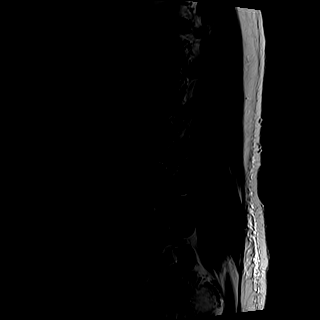
[im 6/15]
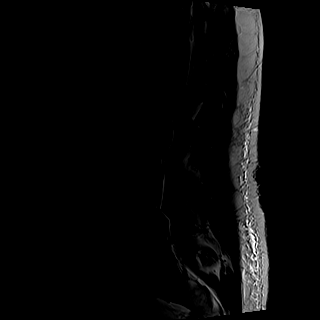
[im 9/15]
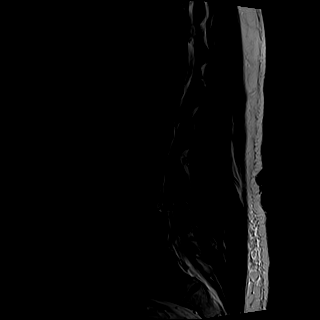
[im 12/15]
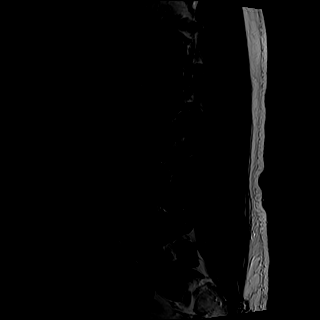
[im 15/15]
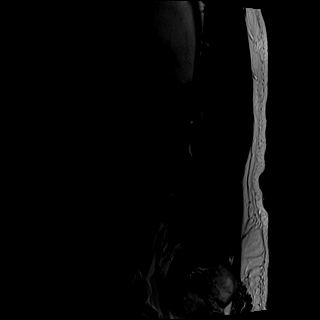

[Series 3: T1 · sagittal · 4.0mm · 0.41mm/px · 6 of 15 slices shown (1 of 2)]
[im 1/15]
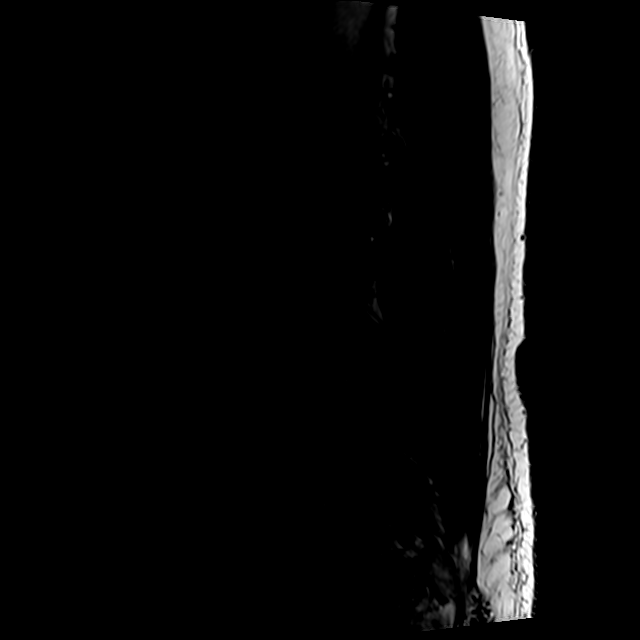
[im 3/15]
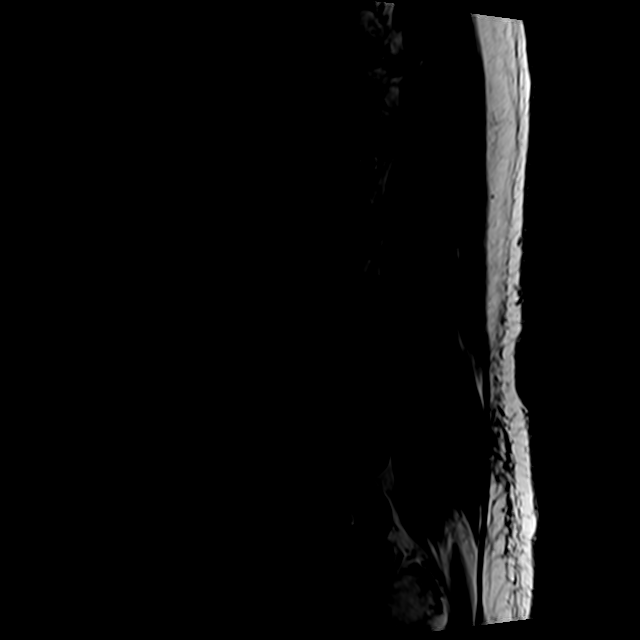
[im 6/15]
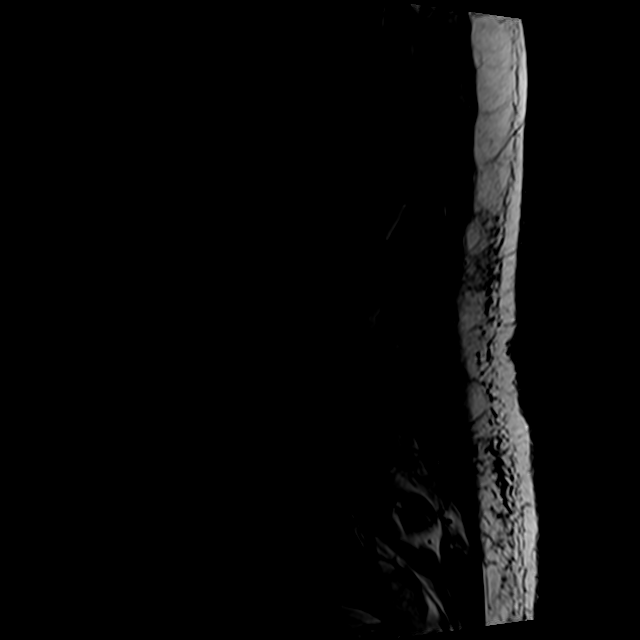
[im 9/15]
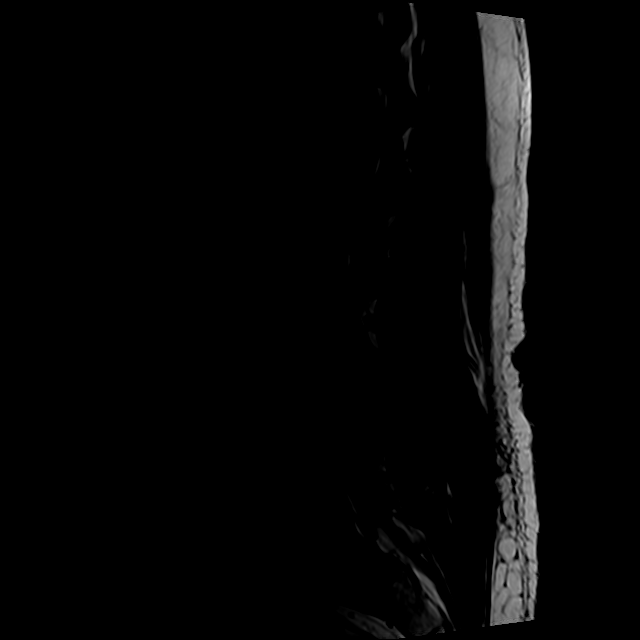
[im 12/15]
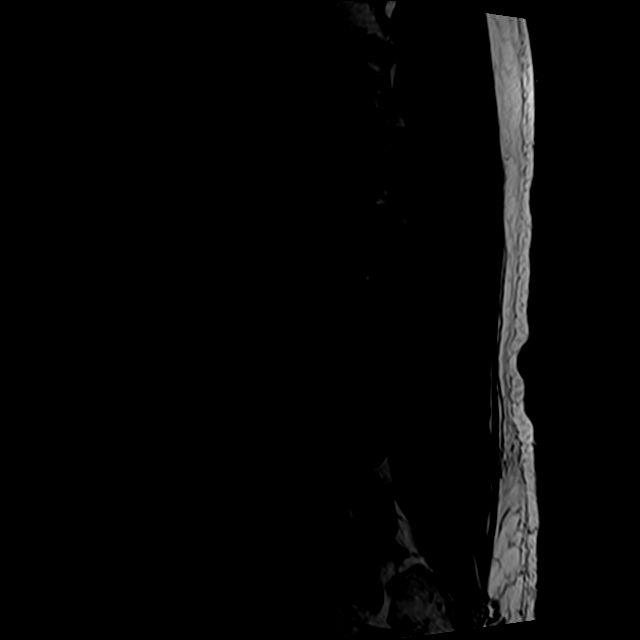
[im 15/15]
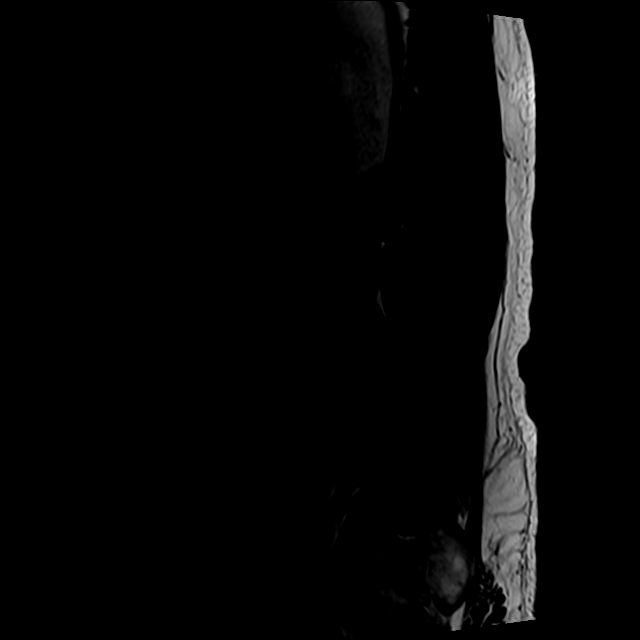

[Series 5: T2 · axial · 4.0mm · 0.78mm/px · z∈[-81,+145]mm · 9 of 41 slices shown (2 of 2)]
[im 1/41]
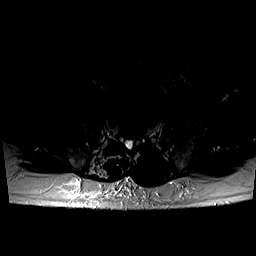
[im 6/41]
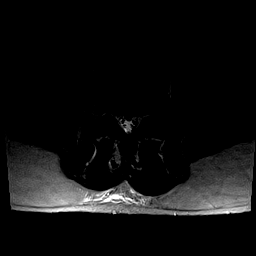
[im 12/41]
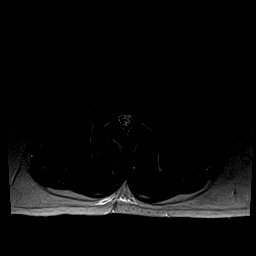
[im 18/41]
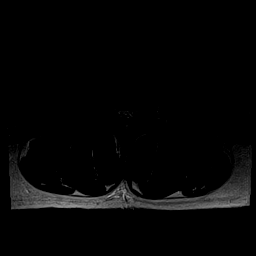
[im 21/41]
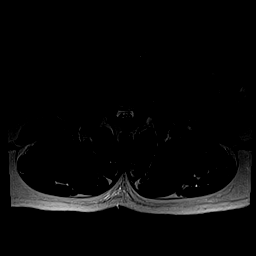
[im 23/41]
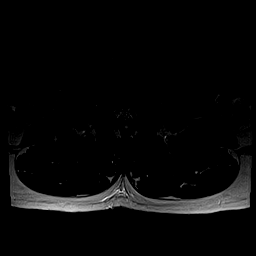
[im 29/41]
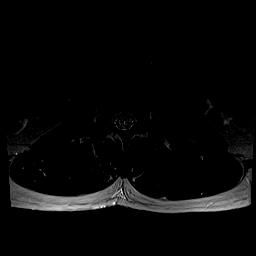
[im 35/41]
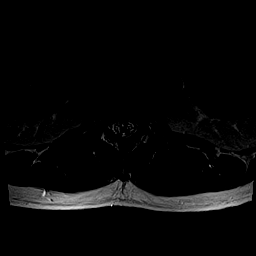
[im 41/41]
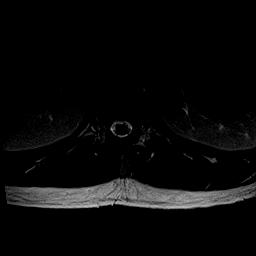

[Series 6: T1 · axial · 4.0mm · 0.31mm/px · z∈[-56,+115]mm · 3 of 41 slices shown (2 of 2)]
[im 6/41]
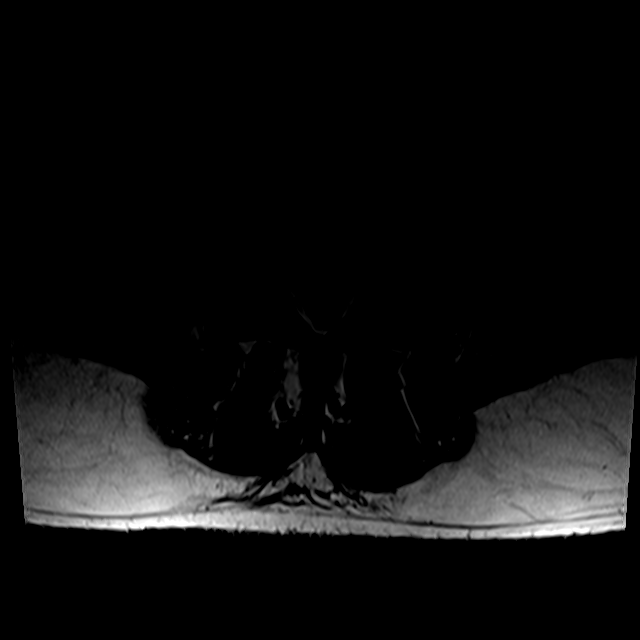
[im 21/41]
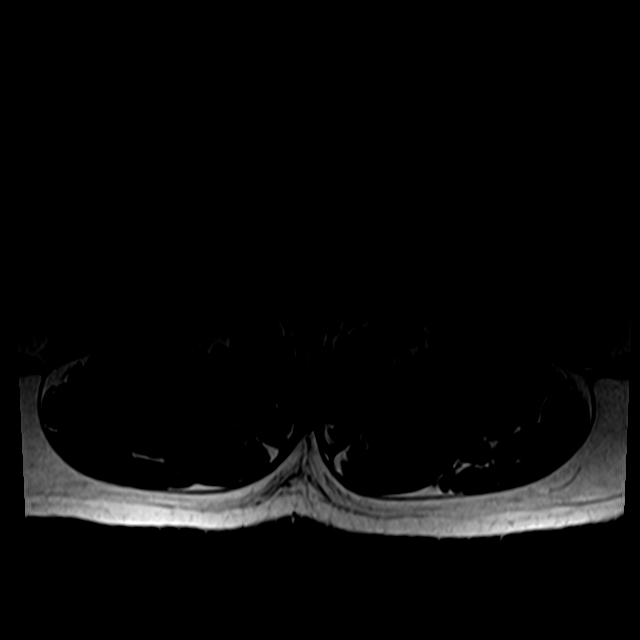
[im 35/41]
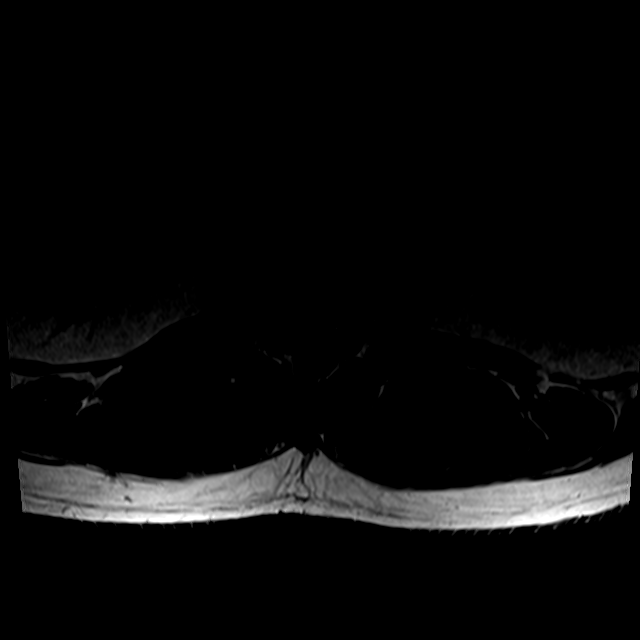

[24 of 48 positions shown; findings below may reference images not displayed]

FINDINGS: Mild, remote anterior superior endplate compression fracture of L1
is unchanged. There is no new fracture. Chronic bilateral L4 pars
interarticularis defects with associated 0.6 cm anterolisthesis L4
on L5 are again seen. Alignment is otherwise normal. Tiny hemangioma
is noted in L2. Marrow signal is otherwise unremarkable. The conus
medullaris is normal in signal and position. Imaged intra-abdominal
contents appear normal.

T11-12 is imaged in the sagittal plane only and negative.

T12-L1:  Negative.

L1-2:  Negative.

L2-3:  Minimal disc bulge.  The central canal and foramina are open.

L3-4: Minimal disc bulge without central canal narrowing. Mild
foraminal narrowing is noted. No nerve root compression. There is
some facet degenerative disease at this level. The appearance is
unchanged.

L4-5: The disc is uncovered with a shallow bulge and more focally
protruding disc in the right foramen. The central canal is widely
patent. Bilateral foraminal narrowing appears worse on the right.
Either exiting L4 root could be impacted. The appearance is not
markedly changed.

L5-S1: Negative.
IMPRESSION: No marked change the appearance lumbar spine since the prior
examination.

Bilateral L4 pars interarticularis defects result in 0.6 cm
anterolisthesis. There is a shallow disc bulge with more focally
protruding disc in the right foramen at this level. Right worse than
left foraminal narrowing is identified with encroachment on the
exiting L4 roots. The central canal is widely patent.

## 2016-11-24 ENCOUNTER — Encounter: Payer: Self-pay | Admitting: Emergency Medicine

## 2016-11-24 ENCOUNTER — Emergency Department: Payer: BLUE CROSS/BLUE SHIELD

## 2016-11-24 ENCOUNTER — Emergency Department
Admission: EM | Admit: 2016-11-24 | Discharge: 2016-11-24 | Disposition: A | Discharge status: Home / Self Care | Payer: BLUE CROSS/BLUE SHIELD | Attending: Emergency Medicine | Admitting: Emergency Medicine

## 2016-11-24 ENCOUNTER — Emergency Department
Admission: EM | Admit: 2016-11-24 | Discharge: 2016-11-24 | Disposition: A | Discharge status: Home / Self Care | Payer: BLUE CROSS/BLUE SHIELD | Source: Home / Self Care | Attending: Emergency Medicine | Admitting: Emergency Medicine

## 2016-11-24 DIAGNOSIS — Z7982 Long term (current) use of aspirin: Secondary | ICD-10-CM

## 2016-11-24 DIAGNOSIS — Z79899 Other long term (current) drug therapy: Secondary | ICD-10-CM | POA: Diagnosis not present

## 2016-11-24 DIAGNOSIS — E119 Type 2 diabetes mellitus without complications: Secondary | ICD-10-CM | POA: Insufficient documentation

## 2016-11-24 DIAGNOSIS — Z87442 Personal history of urinary calculi: Secondary | ICD-10-CM | POA: Insufficient documentation

## 2016-11-24 DIAGNOSIS — M545 Low back pain, unspecified: Secondary | ICD-10-CM

## 2016-11-24 DIAGNOSIS — I10 Essential (primary) hypertension: Secondary | ICD-10-CM | POA: Insufficient documentation

## 2016-11-24 DIAGNOSIS — F17210 Nicotine dependence, cigarettes, uncomplicated: Secondary | ICD-10-CM | POA: Insufficient documentation

## 2016-11-24 DIAGNOSIS — Z7984 Long term (current) use of oral hypoglycemic drugs: Secondary | ICD-10-CM | POA: Insufficient documentation

## 2016-11-24 DIAGNOSIS — J449 Chronic obstructive pulmonary disease, unspecified: Secondary | ICD-10-CM | POA: Insufficient documentation

## 2016-11-24 DIAGNOSIS — R1012 Left upper quadrant pain: Secondary | ICD-10-CM | POA: Diagnosis not present

## 2016-11-24 DIAGNOSIS — T148XXA Other injury of unspecified body region, initial encounter: Secondary | ICD-10-CM

## 2016-11-24 MED ORDER — IBUPROFEN 600 MG PO TABS
600.0000 mg | ORAL_TABLET | Freq: Once | ORAL | Status: DC
  Filled 2016-11-24: qty 1

## 2016-11-24 MED ORDER — OXYCODONE-ACETAMINOPHEN 5-325 MG PO TABS
2.0000 | ORAL_TABLET | Freq: Once | ORAL | Status: DC
  Filled 2016-11-24: qty 2

## 2016-11-24 MED ORDER — DIAZEPAM 5 MG PO TABS: 5.0000 mg | ORAL_TABLET | Freq: Three times a day (TID) | ORAL | 0 refills | Status: AC | PRN

## 2016-11-24 NOTE — Discharge Instructions (Signed)
Please do not drive when you take your oxycodone or your diazepam. Take her medications only as needed for severe pain and follow-up with your primary care physician this week if your pain does not improve. Return to the emergency department for any concerns.  It was a pleasure to take care of you today, and thank you for coming to our emergency department.  If you have any questions or concerns before leaving please ask the nurse to grab me and I'm more than happy to go through your aftercare instructions again.  If you were prescribed any opioid pain medication today such as Norco, Vicodin, Percocet, morphine, hydrocodone, or oxycodone please make sure you do not drive when you are taking this medication as it can alter your ability to drive safely.  If you have any concerns once you are home that you are not improving or are in fact getting worse before you can make it to your follow-up appointment, please do not hesitate to call [phone] and come back for further evaluation.  Merrily Brittle, MD  Results for orders placed or performed during the hospital encounter of 02/13/16  Glucose, capillary  Result Value Ref Range   Glucose-Capillary 158 (H) 65 - 99 mg/dL   Comment 1 Notify RN    Comment 2 Document in Chart   Glucose, capillary  Result Value Ref Range   Glucose-Capillary 128 (H) 65 - 99 mg/dL  Glucose, capillary  Result Value Ref Range   Glucose-Capillary 172 (H) 65 - 99 mg/dL  CBC  Result Value Ref Range   WBC 20.2 (H) 4.0 - 10.5 K/uL   RBC 4.22 4.22 - 5.81 MIL/uL   Hemoglobin 14.0 13.0 - 17.0 g/dL   HCT 78.5 88.5 - 02.7 %   MCV 96.7 78.0 - 100.0 fL   MCH 33.2 26.0 - 34.0 pg   MCHC 34.3 30.0 - 36.0 g/dL   RDW 74.1 28.7 - 86.7 %   Platelets 219 150 - 400 K/uL  Basic Metabolic Panel  Result Value Ref Range   Sodium 137 135 - 145 mmol/L   Potassium 4.6 3.5 - 5.1 mmol/L   Chloride 102 101 - 111 mmol/L   CO2 26 22 - 32 mmol/L   Glucose, Bld 212 (H) 65 - 99 mg/dL   BUN 14 6 - 20  mg/dL   Creatinine, Ser 6.72 0.61 - 1.24 mg/dL   Calcium 9.4 8.9 - 09.4 mg/dL   GFR calc non Af Amer >60 >60 mL/min   GFR calc Af Amer >60 >60 mL/min   Anion gap 9 5 - 15  Glucose, capillary  Result Value Ref Range   Glucose-Capillary 276 (H) 65 - 99 mg/dL   Comment 1 Notify RN    Comment 2 Document in Chart   Glucose, capillary  Result Value Ref Range   Glucose-Capillary 293 (H) 65 - 99 mg/dL   Ct Renal Stone Study  Result Date: 11/24/2016 CLINICAL DATA:  Left flank pain.  Stone disease suspected. EXAM: CT ABDOMEN AND PELVIS WITHOUT CONTRAST TECHNIQUE: Multidetector CT imaging of the abdomen and pelvis was performed following the standard protocol without IV contrast. COMPARISON:  None. FINDINGS: Lower chest: The lung bases are clear. There are coronary artery calcifications. Hepatobiliary: The liver is enlarged spanning 22.4 cm cranial caudal. Diffusely decreased hepatic density consistent with steatosis. Gallbladder is nondistended, no calcified stone. Pancreas: No ductal dilatation or inflammation. Spleen: Normal in size without focal abnormality. Adrenals/Urinary Tract: Normal adrenal glands. No hydronephrosis. Probable bilateral punctate  nonobstructing stones in both kidneys. Ureters are decompressed, no ureteral stone. There is mild nonspecific bilateral perinephric edema. No bladder stone or wall thickening. Stomach/Bowel: Stomach is within normal limits. Appendix appears normal. No evidence of bowel wall thickening, distention, or inflammatory changes. Vascular/Lymphatic: Aortic atherosclerosis without aneurysm. Aortocaval node at the level of the kidneys measures 11 mm. There are small periaortic nodes. No mesenteric or pelvic sidewall adenopathy. Prominent right inguinal node measures 14 mm short axis. Reproductive: Prostatic and seminal vesicle calcifications. Other: Small fat containing umbilical hernia. No free air, free fluid, or intra-abdominal fluid collection. Small calcified lymph  node in the left lower quadrant. Musculoskeletal: Posterior L4-L5 fusion with interbody spacer. Mild chronic L1 compression fracture. There are no acute or suspicious osseous abnormalities. IMPRESSION: 1. Punctate bilateral nonobstructing nephrolithiasis. No hydronephrosis or obstructive uropathy. 2. Hepatomegaly and hepatic steatosis. 3. A few prominent retroperitoneal and right inguinal lymph nodes, nonspecific but likely reactive in the absence of known malignancy. 4.  Aortic Atherosclerosis (ICD10-I70.0). Electronically Signed   By: Rubye Oaks M.D.   On: 11/24/2016 05:04

## 2016-11-24 NOTE — ED Notes (Signed)
Pt refusing pain medication at this time. Medication returned and MD notified.

## 2016-11-24 NOTE — ED Notes (Addendum)
Pt ready to leave. Pt up for discharge

## 2016-11-24 NOTE — ED Triage Notes (Signed)
Patient with complaint of left lower back pain that started when his mowing his yard on Friday. Patient denies any urinary symptoms.

## 2016-11-24 NOTE — ED Triage Notes (Signed)
Pt c/o left mid back pain that started when he was mowing his yard on Friday; denies any specific event that may have caused injury; pain 4/10 at rest, 8/10 with any type of movement; pt denies urinary s/s; ambulatory with slow steady gait;

## 2016-11-24 NOTE — ED Notes (Signed)
Esign not working. Pt verbalized understanding. Pt has no questions at this time. Pt ambulatory to discharge.

## 2016-11-24 NOTE — ED Provider Notes (Signed)
[facility] Emergency Department Provider Note  ____________________________________________   First MD Initiated Contact with Patient 11/24/16 [phone]     (approximate)  I have reviewed the triage vital signs and the nursing notes.   HISTORY  Chief Complaint Back Pain    HPI Darin Waters is a 56 y.o. male who comes to the emergency department with several days of gradual onset severe left flank pain radiating towards his groin. He has a past medical history of previous spinal fusion although he says this feels different from his typical back pain and he is worried he has another kidney stone.He denies fevers or chills. He denies dysuria frequency hesitancy or hematuria. He does say that 2 days ago he spent an hour and a half mowing his lawn over rough terrain which she normally does not do. He has no bowel or bladder incontinence or hesitancy. He does not use IV drugs has had no unintentional weight loss.   Past Medical History:  Diagnosis Date  . Arthritis    DDD- lumbar , thumbs - both hands   . COPD (chronic obstructive pulmonary disease) (HCC)   . Depression   . Diabetes mellitus without complication (HCC)   . GERD (gastroesophageal reflux disease)   . History of kidney stones 1990's    lithotripsy done   . Hypertension   . Sleep apnea 01/30/2016   [facility] in [place], results not avail to pt. yet    Patient Active Problem List   Diagnosis Date Noted  . Spondylolisthesis of lumbar region 02/13/2016  . Cervical herniated disc 07/18/2015    Past Surgical History:  Procedure Laterality Date  . ANTERIOR CERVICAL DECOMP/DISCECTOMY FUSION N/A 07/18/2015   Procedure: CERVICALFIVE-SIX, CERVICAL SIX-SEVEN ANTERIOR CERVICAL DECOMPRESSION/DISCECTOMY FUSION;  Surgeon: Tressie Stalker, MD;  Location: [facility];  Service: Neurosurgery;  Laterality: N/A;  C56 C67 anterior cervical decompression with fusion interbody prosthesis plating and  bonegraft  . COLONOSCOPY    . RECTAL POLYPECTOMY  4-5 yrs ago    Prior to Admission medications   Medication Sig Start Date End Date Taking? Authorizing Provider  amLODipine-benazepril (LOTREL) 5-20 MG capsule Take 1 capsule by mouth daily before breakfast.     [provider]  aspirin 81 MG tablet Take 81 mg by mouth daily.    [provider]  citalopram (CELEXA) 10 MG tablet Take 10 mg by mouth daily before breakfast.     [provider]  cyclobenzaprine (FLEXERIL) 10 MG tablet Take 1 tablet (10 mg total) by mouth 3 (three) times daily as needed. 07/19/15   Tressie Stalker, MD  cyclobenzaprine (FLEXERIL) 10 MG tablet Take 1 tablet (10 mg total) by mouth 3 (three) times daily as needed for muscle spasms. 02/14/16   Tressie Stalker, MD  docusate sodium (COLACE) 100 MG capsule Take 1 capsule (100 mg total) by mouth 2 (two) times daily. Patient taking differently: Take 100 mg by mouth 2 (two) times daily as needed for mild constipation.  07/19/15   Tressie Stalker, MD  docusate sodium (COLACE) 100 MG capsule Take 1 capsule (100 mg total) by mouth 2 (two) times daily. 02/14/16   Tressie Stalker, MD  esomeprazole (NEXIUM) 20 MG capsule Take 20 mg by mouth daily before breakfast.    [provider]  gabapentin (NEURONTIN) 300 MG capsule Take 1 capsule by mouth 2 (two) times daily. 11/26/15   [provider]  glimepiride (AMARYL) 2 MG tablet Take 2 mg by mouth daily  with breakfast.    [provider]  metFORMIN (GLUCOPHAGE) 500 MG tablet Take 1,000 mg by mouth 2 (two) times daily with a meal.     [provider]  Multiple Vitamins-Minerals (MULTIVITAMIN WITH MINERALS) tablet Take 1 tablet by mouth daily.    [provider]  oxyCODONE-acetaminophen (PERCOCET) 10-325 MG tablet Take 1 tablet by mouth every 4 (four) hours as needed for pain. 02/14/16   Tressie Stalker, MD  PROAIR HFA 108 [phone] Base) MCG/ACT inhaler Take 2 puffs by  mouth every 6 (six) hours as needed for wheezing or shortness of breath.  01/24/16   [provider]  quiNINE (QUALAQUIN) 324 MG capsule Take 1 capsule by mouth every other day.  11/13/15   [provider]  simvastatin (ZOCOR) 20 MG tablet Take 20 mg by mouth daily at 6 PM.     [provider]  SYMBICORT 160-4.5 MCG/ACT inhaler Take 1 puff by mouth 2 (two) times daily. 01/24/16   [provider]    Allergies Patient has no known allergies.  No family history on file.  Social History Social History  Substance Use Topics  . Smoking status: Current Every Day Smoker    Packs/day: 2.50    Years: 45.00  . Smokeless tobacco: Never Used  . Alcohol use No    Review of Systems Constitutional: No fever/chills ENT: No sore throat. Cardiovascular: Denies chest pain. Respiratory: Denies shortness of breath. Gastrointestinal: No abdominal pain.  No nausea, no vomiting.  No diarrhea.  No constipation. Musculoskeletal: Positive for back pain. Neurological: Negative for headaches   ____________________________________________   PHYSICAL EXAM:  VITAL SIGNS: ED Triage Vitals  Enc Vitals Group     BP 11/24/16 0042 (!) 161/69     Pulse Rate 11/24/16 0042 61     Resp 11/24/16 0042 18     Temp 11/24/16 0042 98 F (36.7 C)     Temp Source 11/24/16 0042 Oral     SpO2 11/24/16 0042 97 %     Weight 11/24/16 0044 210 lb (95.3 kg)     Height 11/24/16 0044 5\' 6"  (1.676 m)     Head Circumference --      Peak Flow --      Pain Score 11/24/16 0043 4     Pain Loc --      Pain Edu? --      Excl. in GC? --     Constitutional: Alert and oriented 4 well appearing nontoxic no diaphoresis Head: Atraumatic. Nose: No congestion/rhinnorhea. Mouth/Throat: No trismus Neck: No stridor.   Cardiovascular: Regular rate and rhythm Respiratory: Normal respiratory effort.  No retractions. Gastrointestinal: Soft nontender no costovertebral tenderness Musculoskeletal: He  has tender left low back with spasm 5 out of 5 strength throughout and ambulating with steady gait Neurologic:  Normal speech and language. No gross focal neurologic deficits are appreciated.  Skin:  Skin is warm, dry and intact. No rash noted.    ____________________________________________  LABS (all labs ordered are listed, but only abnormal results are displayed)  Labs Reviewed - No data to display   __________________________________________  EKG   ____________________________________________  RADIOLOGY   ____________________________________________   PROCEDURES  Procedure(s) performed: no  Procedures  Critical Care performed: no  Observation: no ____________________________________________   INITIAL IMPRESSION / ASSESSMENT AND PLAN / ED COURSE  Pertinent labs & imaging results that were available during my care of the patient were reviewed by me and considered in my medical decision making (see  chart for details).  The patient's history and exam are most consistent with musculoskeletal back pain. Doubt central etiology. He says this does feel different than his usual back pain and he has a history of renal colic so I will obtain a CT scan now.      ____----------------------------------------- 2:31 AM on 11/24/2016 -----------------------------------------  The patient declined his pain medication. Shortly after ordering the CT scan he said he would actually prefer to go home and he'll see his primary care physician later on this afternoon. I think is reasonable. He is discharged home in stable condition. ________________________________________   FINAL CLINICAL IMPRESSION(S) / ED DIAGNOSES  Final diagnoses:  None      NEW MEDICATIONS STARTED DURING THIS VISIT:  New Prescriptions   No medications on file     Note:  This document was prepared using Dragon voice recognition software and may include unintentional dictation errors.        Merrily Brittle, MD 11/24/16 [phone]

## 2016-11-24 NOTE — ED Notes (Signed)

## 2016-11-24 NOTE — Discharge Instructions (Signed)
Please make an appointment to follow-up with your primary care physician this week for reevaluation and return to the emergency department for any concerns.  It was a pleasure to take care of you today, and thank you for coming to our emergency department.  If you have any questions or concerns before leaving please ask the nurse to grab me and I'm more than happy to go through your aftercare instructions again.  If you were prescribed any opioid pain medication today such as Norco, Vicodin, Percocet, morphine, hydrocodone, or oxycodone please make sure you do not drive when you are taking this medication as it can alter your ability to drive safely.  If you have any concerns once you are home that you are not improving or are in fact getting worse before you can make it to your follow-up appointment, please do not hesitate to call 911 and come back for further evaluation.  Merrily Brittle, MD

## 2016-11-24 NOTE — ED Provider Notes (Signed)
[facility] Emergency Department Provider Note  ____________________________________________   First MD Initiated Contact with Patient 11/24/16 [phone]     (approximate)  I have reviewed the triage vital signs and the nursing notes.   HISTORY  Chief Complaint Back Pain    HPI Darin Waters is a 56 y.o. male who comes to the emergency department with left flank pain. I discharged him from our hospital roughly an hour and a half ago, however when he got home he decided his pain was too intense and he wanted the CT scan I had ordered after all. He declines pain medication at this time because he does not want to be treated until he has a diagnosis.His pain is moderate to severe aching throbbing left low back worse with movement and improved with rest.   Past Medical History:  Diagnosis Date  . Arthritis    DDD- lumbar , thumbs - both hands   . COPD (chronic obstructive pulmonary disease) (HCC)   . Depression   . Diabetes mellitus without complication (HCC)   . GERD (gastroesophageal reflux disease)   . History of kidney stones 1990's    lithotripsy done   . Hypertension   . Sleep apnea 01/30/2016   [facility] in [place], results not avail to pt. yet    Patient Active Problem List   Diagnosis Date Noted  . Spondylolisthesis of lumbar region 02/13/2016  . Cervical herniated disc 07/18/2015    Past Surgical History:  Procedure Laterality Date  . ANTERIOR CERVICAL DECOMP/DISCECTOMY FUSION N/A 07/18/2015   Procedure: CERVICALFIVE-SIX, CERVICAL SIX-SEVEN ANTERIOR CERVICAL DECOMPRESSION/DISCECTOMY FUSION;  Surgeon: Tressie Stalker, MD;  Location: [facility];  Service: Neurosurgery;  Laterality: N/A;  C56 C67 anterior cervical decompression with fusion interbody prosthesis plating and bonegraft  . COLONOSCOPY    . RECTAL POLYPECTOMY  4-5 yrs ago    Prior to Admission medications   Medication Sig Start Date End Date Taking? Authorizing Provider   amLODipine-benazepril (LOTREL) 5-20 MG capsule Take 1 capsule by mouth daily before breakfast.     [provider]  aspirin 81 MG tablet Take 81 mg by mouth daily.    [provider]  citalopram (CELEXA) 10 MG tablet Take 10 mg by mouth daily before breakfast.     [provider]  cyclobenzaprine (FLEXERIL) 10 MG tablet Take 1 tablet (10 mg total) by mouth 3 (three) times daily as needed. 07/19/15   Tressie Stalker, MD  cyclobenzaprine (FLEXERIL) 10 MG tablet Take 1 tablet (10 mg total) by mouth 3 (three) times daily as needed for muscle spasms. 02/14/16   Tressie Stalker, MD  diazepam (VALIUM) 5 MG tablet Take 1 tablet (5 mg total) by mouth every 8 (eight) hours as needed for muscle spasms. 11/24/16 11/24/17  Merrily Brittle, MD  docusate sodium (COLACE) 100 MG capsule Take 1 capsule (100 mg total) by mouth 2 (two) times daily. Patient taking differently: Take 100 mg by mouth 2 (two) times daily as needed for mild constipation.  07/19/15   Tressie Stalker, MD  docusate sodium (COLACE) 100 MG capsule Take 1 capsule (100 mg total) by mouth 2 (two) times daily. 02/14/16   Tressie Stalker, MD  esomeprazole (NEXIUM) 20 MG capsule Take 20 mg by mouth daily before breakfast.    [provider]  gabapentin (NEURONTIN) 300 MG capsule Take 1 capsule by mouth 2 (two) times daily. 11/26/15   [provider]  glimepiride (AMARYL) 2 MG tablet Take 2  mg by mouth daily with breakfast.    [provider]  metFORMIN (GLUCOPHAGE) 500 MG tablet Take 1,000 mg by mouth 2 (two) times daily with a meal.     [provider]  Multiple Vitamins-Minerals (MULTIVITAMIN WITH MINERALS) tablet Take 1 tablet by mouth daily.    [provider]  oxyCODONE-acetaminophen (PERCOCET) 10-325 MG tablet Take 1 tablet by mouth every 4 (four) hours as needed for pain. 02/14/16   Tressie Stalker, MD  PROAIR HFA 108 [phone] Base) MCG/ACT inhaler Take 2 puffs by mouth every 6  (six) hours as needed for wheezing or shortness of breath.  01/24/16   [provider]  quiNINE (QUALAQUIN) 324 MG capsule Take 1 capsule by mouth every other day.  11/13/15   [provider]  simvastatin (ZOCOR) 20 MG tablet Take 20 mg by mouth daily at 6 PM.     [provider]  SYMBICORT 160-4.5 MCG/ACT inhaler Take 1 puff by mouth 2 (two) times daily. 01/24/16   [provider]    Allergies Patient has no known allergies.  No family history on file.  Social History Social History  Substance Use Topics  . Smoking status: Current Every Day Smoker    Packs/day: 2.50    Years: 45.00  . Smokeless tobacco: Never Used  . Alcohol use No    Review of Systems Constitutional: No fever/chills ENT: No sore throat. Cardiovascular: Denies chest pain. Respiratory: Denies shortness of breath. Gastrointestinal: No abdominal pain.  No nausea, no vomiting.  No diarrhea.  No constipation. Musculoskeletal: Positive for back pain. Neurological: Negative for headaches   ____________________________________________   PHYSICAL EXAM:  VITAL SIGNS: ED Triage Vitals [11/24/16 0340]  Enc Vitals Group     BP      Pulse Rate 67     Resp 18     Temp 98.7 F (37.1 C)     Temp Source Oral     SpO2 95 %     Weight 210 lb (95.3 kg)     Height 5\' 6"  (1.676 m)     Head Circumference      Peak Flow      Pain Score 5     Pain Loc      Pain Edu?      Excl. in GC?     Constitutional: Alert and oriented 4 well appearing Head: Atraumatic. Nose: No congestion/rhinnorhea. Mouth/Throat: No trismus Neck: No stridor.   Cardiovascular: Regular rate and rhythm Respiratory: Normal respiratory effort.  No retractions. Musculoskeletal: Remains tender left low back lumbar area with spasm with normal neurological exam Neurologic:  Normal speech and language. No gross focal neurologic deficits are appreciated.  Skin:  Skin is warm, dry and intact. No rash  noted.    ____________________________________________  LABS (all labs ordered are listed, but only abnormal results are displayed)  Labs Reviewed - No data to display   __________________________________________  EKG   ____________________________________________  RADIOLOGY  CT scan with bilateral punctate kidney stones but no stones in the ureters. No clear etiology of his pain identified ____________________________________________   PROCEDURES  Procedure(s) performed: no  Procedures  Critical Care performed: no  Observation: no ____________________________________________   INITIAL IMPRESSION / ASSESSMENT AND PLAN / ED COURSE  Pertinent labs & imaging results that were available during my care of the patient were reviewed by me and considered in my medical decision making (see chart for details).  Fortunately the patient's CT scan is negative for acute pathology.  He is alert he taking Flexeril and oxycodone so I'll add on Valium with caveat that he understands he cannot drive under any circumstances while taking any of these medications. I'm referring him back to his primary care physician for further evaluation and treatment. He very well may require physical therapy.      ____________________________________________   FINAL CLINICAL IMPRESSION(S) / ED DIAGNOSES  Final diagnoses:  Muscle strain  Acute left-sided low back pain without sciatica      NEW MEDICATIONS STARTED DURING THIS VISIT:  Discharge Medication List as of 11/24/2016  5:24 AM    START taking these medications   Details  diazepam (VALIUM) 5 MG tablet Take 1 tablet (5 mg total) by mouth every 8 (eight) hours as needed for muscle spasms., Starting Mon 11/24/2016, Until Tue 11/24/2017, Print         Note:  This document was prepared using Dragon voice recognition software and may include unintentional dictation errors.      Merrily Brittle, MD 11/24/16 0600

## 2017-05-14 ENCOUNTER — Ambulatory Visit: Payer: Medicare HMO | Admitting: Internal Medicine

## 2017-05-14 ENCOUNTER — Encounter: Payer: Self-pay | Admitting: Internal Medicine

## 2017-05-14 VITALS — BP 148/79 | HR 56 | Resp 16 | Ht 66.0 in | Wt 199.2 lb

## 2017-05-14 DIAGNOSIS — F17200 Nicotine dependence, unspecified, uncomplicated: Secondary | ICD-10-CM | POA: Diagnosis not present

## 2017-05-14 DIAGNOSIS — J449 Chronic obstructive pulmonary disease, unspecified: Secondary | ICD-10-CM | POA: Diagnosis not present

## 2017-05-14 DIAGNOSIS — E669 Obesity, unspecified: Secondary | ICD-10-CM | POA: Insufficient documentation

## 2017-05-14 DIAGNOSIS — I2583 Coronary atherosclerosis due to lipid rich plaque: Secondary | ICD-10-CM | POA: Diagnosis not present

## 2017-05-14 DIAGNOSIS — R0602 Shortness of breath: Secondary | ICD-10-CM | POA: Diagnosis not present

## 2017-05-14 DIAGNOSIS — I2510 Atherosclerotic heart disease of native coronary artery without angina pectoris: Secondary | ICD-10-CM | POA: Insufficient documentation

## 2017-05-14 DIAGNOSIS — K760 Fatty (change of) liver, not elsewhere classified: Secondary | ICD-10-CM | POA: Insufficient documentation

## 2017-05-14 DIAGNOSIS — F1190 Opioid use, unspecified, uncomplicated: Secondary | ICD-10-CM | POA: Insufficient documentation

## 2017-05-14 NOTE — Patient Instructions (Signed)
Steps to Quit Smoking Smoking tobacco can be bad for your health. It can also affect almost every organ in your body. Smoking puts you and people around you at risk for many serious long-lasting (chronic) diseases. Quitting smoking is hard, but it is one of the best things that you can do for your health. It is never too late to quit. What are the benefits of quitting smoking? When you quit smoking, you lower your risk for getting serious diseases and conditions. They can include:  Lung cancer or lung disease.  Heart disease.  Stroke.  Heart attack.  Not being able to have children (infertility).  Weak bones (osteoporosis) and broken bones (fractures).  If you have coughing, wheezing, and shortness of breath, those symptoms may get better when you quit. You may also get sick less often. If you are pregnant, quitting smoking can help to lower your chances of having a baby of low birth weight. What can I do to help me quit smoking? Talk with your doctor about what can help you quit smoking. Some things you can do (strategies) include:  Quitting smoking totally, instead of slowly cutting back how much you smoke over a period of time.  Going to in-person counseling. You are more likely to quit if you go to many counseling sessions.  Using resources and support systems, such as: ? Online chats with a counselor. ? Phone quitlines. ? Printed self-help materials. ? Support groups or group counseling. ? Text messaging programs. ? Mobile phone apps or applications.  Taking medicines. Some of these medicines may have nicotine in them. If you are pregnant or breastfeeding, do not take any medicines to quit smoking unless your doctor says it is okay. Talk with your doctor about counseling or other things that can help you.  Talk with your doctor about using more than one strategy at the same time, such as taking medicines while you are also going to in-person counseling. This can help make  quitting easier. What things can I do to make it easier to quit? Quitting smoking might feel very hard at first, but there is a lot that you can do to make it easier. Take these steps:  Talk to your family and friends. Ask them to support and encourage you.  Call phone quitlines, reach out to support groups, or work with a counselor.  Ask people who smoke to not smoke around you.  Avoid places that make you want (trigger) to smoke, such as: ? Bars. ? Parties. ? Smoke-break areas at work.  Spend time with people who do not smoke.  Lower the stress in your life. Stress can make you want to smoke. Try these things to help your stress: ? Getting regular exercise. ? Deep-breathing exercises. ? Yoga. ? Meditating. ? Doing a body scan. To do this, close your eyes, focus on one area of your body at a time from head to toe, and notice which parts of your body are tense. Try to relax the muscles in those areas.  Download or buy apps on your mobile phone or tablet that can help you stick to your quit plan. There are many free apps, such as QuitGuide from the CDC (Centers for Disease Control and Prevention). You can find more support from smokefree.gov and other websites.  This information is not intended to replace advice given to you by your health care provider. Make sure you discuss any questions you have with your health care provider. Document Released: 01/11/2009 Document   Revised: 11/13/2015 Document Reviewed: 08/01/2014 Elsevier Interactive Patient Education  2018 [employer]  

## 2017-05-14 NOTE — Progress Notes (Signed)
[facility] [address] [place], [place] [address]  Pulmonary Sleep Medicine  Office Visit Note  Patient Name: Darin Waters DOB: 1961-01-05 MRN [id number]  Date of Service: 05/14/2017  Complaints/HPI: He has had issues with breathing. He staets he is still smoking. He states currently 2PPD at minimum. Patient states that he has had been trying to quit. Patient states that he wants know how much damage he has done to his lungs. He has been trying the e-cig also. Patient states he also has been diagnosed with DM. He has noted SOB cough and congestion at times. He has no recent chest pain or tightness  ROS  General: (-) fever, (-) chills, (-) night sweats, (-) weakness Skin: (-) rashes, (-) itching,. Eyes: (-) visual changes, (-) redness, (-) itching. Nose and Sinuses: (-) nasal stuffiness or itchiness, (-) postnasal drip, (-) nosebleeds, (-) sinus trouble. Mouth and Throat: (-) sore throat, (-) hoarseness. Neck: (-) swollen glands, (-) enlarged thyroid, (-) neck pain. Respiratory: + cough, (-) bloody sputum, + shortness of breath, - wheezing. Cardiovascular: - ankle swelling, (-) chest pain. Lymphatic: (-) lymph node enlargement. Neurologic: (-) numbness, (-) tingling. Psychiatric: (-) anxiety, (-) depression   Current Medication: Outpatient Encounter Medications as of 05/14/2017  Medication Sig  . amLODipine-benazepril (LOTREL) 5-20 MG capsule Take 1 capsule by mouth daily before breakfast.   . aspirin 81 MG tablet Take 81 mg by mouth daily.  . citalopram (CELEXA) 10 MG tablet Take 10 mg by mouth daily before breakfast.   . cyclobenzaprine (FLEXERIL) 10 MG tablet Take 1 tablet (10 mg total) by mouth 3 (three) times daily as needed.  . cyclobenzaprine (FLEXERIL) 10 MG tablet Take 1 tablet (10 mg total) by mouth 3 (three) times daily as needed for muscle spasms.  . diazepam (VALIUM) 5 MG tablet Take 1 tablet (5 mg total) by mouth every 8 (eight) hours as needed for  muscle spasms.  Marland Kitchen docusate sodium (COLACE) 100 MG capsule Take 1 capsule (100 mg total) by mouth 2 (two) times daily. (Patient taking differently: Take 100 mg by mouth 2 (two) times daily as needed for mild constipation. )  . docusate sodium (COLACE) 100 MG capsule Take 1 capsule (100 mg total) by mouth 2 (two) times daily.  Marland Kitchen esomeprazole (NEXIUM) 20 MG capsule Take 20 mg by mouth daily before breakfast.  . gabapentin (NEURONTIN) 300 MG capsule Take 1 capsule by mouth 2 (two) times daily.  Marland Kitchen glimepiride (AMARYL) 2 MG tablet Take 2 mg by mouth daily with breakfast.  . metFORMIN (GLUCOPHAGE) 500 MG tablet Take 1,000 mg by mouth 2 (two) times daily with a meal.   . Multiple Vitamins-Minerals (MULTIVITAMIN WITH MINERALS) tablet Take 1 tablet by mouth daily.  Marland Kitchen oxyCODONE-acetaminophen (PERCOCET) 10-325 MG tablet Take 1 tablet by mouth every 4 (four) hours as needed for pain.  Marland Kitchen PROAIR HFA 108 (90 Base) MCG/ACT inhaler Take 2 puffs by mouth every 6 (six) hours as needed for wheezing or shortness of breath.   . quiNINE (QUALAQUIN) 324 MG capsule Take 1 capsule by mouth every other day.   . simvastatin (ZOCOR) 20 MG tablet Take 20 mg by mouth daily at 6 PM.   . SYMBICORT 160-4.5 MCG/ACT inhaler Take 1 puff by mouth 2 (two) times daily.   No facility-administered encounter medications on file as of 05/14/2017.     Surgical History: Past Surgical History:  Procedure Laterality Date  . ANTERIOR CERVICAL DECOMP/DISCECTOMY FUSION N/A 07/18/2015   Procedure:  CERVICALFIVE-SIX, CERVICAL SIX-SEVEN ANTERIOR CERVICAL DECOMPRESSION/DISCECTOMY FUSION;  Surgeon: Tressie StalkerJeffrey Jenkins, MD;  Location: [facility];  Service: Neurosurgery;  Laterality: N/A;  C56 C67 anterior cervical decompression with fusion interbody prosthesis plating and bonegraft  . COLONOSCOPY    . RECTAL POLYPECTOMY  4-5 yrs ago    Medical History: Past Medical History:  Diagnosis Date  . Arthritis    DDD- lumbar , thumbs - both hands   . COPD  (chronic obstructive pulmonary disease) (HCC)   . Depression   . Diabetes mellitus without complication (HCC)   . GERD (gastroesophageal reflux disease)   . History of kidney stones 1990's    lithotripsy done   . Hypertension   . Sleep apnea 01/30/2016   [facility] [place], results not avail to pt. yet    Family History: Family History  Problem Relation Age of Onset  . Diabetes Mother   . Asthma Mother   . Hypertension Mother   . Hyperlipidemia Mother   . Hyperlipidemia Father   . Dementia Father   . Hypertension Father   . Heart Problems Father        pace maker    Social History: Social History   Socioeconomic History  . Marital status: Married    Spouse name: Not on file  . Number of children: Not on file  . Years of education: Not on file  . Highest education level: Not on file  Social Needs  . Financial resource strain: Not on file  . Food insecurity - worry: Not on file  . Food insecurity - inability: Not on file  . Transportation needs - medical: Not on file  . Transportation needs - non-medical: Not on file  Occupational History  . Not on file  Tobacco Use  . Smoking status: Current Every Day Smoker    Packs/day: 2.50    Years: 45.00    Pack years: 112.50  . Smokeless tobacco: Never Used  Substance and Sexual Activity  . Alcohol use: No  . Drug use: No  . Sexual activity: Not on file  Other Topics Concern  . Not on file  Social History Narrative  . Not on file    Vital Signs: Blood pressure (!) 148/79, pulse (!) 56, resp. rate 16, height 5\' 6"  (1.676 m), weight 199 lb 3.2 oz (90.4 kg), SpO2 97 %.  Examination: General Appearance: The patient is well-developed, well-nourished, and in no distress. Skin: Gross inspection of skin unremarkable. Head: normocephalic, no gross deformities. Eyes: no gross deformities noted. ENT: ears appear grossly normal no exudates. Neck: Supple. No thyromegaly. No LAD. Respiratory: scattered  rhonchi. Cardiovascular: Normal S1 and S2 without murmur or rub. Extremities: No cyanosis. pulses are equal. Neurologic: Alert and oriented. No involuntary movements.  LABS: No results found for this or any previous visit (from the past 2160 hour(s)).  Radiology: Ct Renal Stone Study  Result Date: 11/24/2016 CLINICAL DATA:  Left flank pain.  Stone disease suspected. EXAM: CT ABDOMEN AND PELVIS WITHOUT CONTRAST TECHNIQUE: Multidetector CT imaging of the abdomen and pelvis was performed following the standard protocol without IV contrast. COMPARISON:  None. FINDINGS: Lower chest: The lung bases are clear. There are coronary artery calcifications. Hepatobiliary: The liver is enlarged spanning 22.4 cm cranial caudal. Diffusely decreased hepatic density consistent with steatosis. Gallbladder is nondistended, no calcified stone. Pancreas: No ductal dilatation or inflammation. Spleen: Normal in size without focal abnormality. Adrenals/Urinary Tract: Normal adrenal glands. No hydronephrosis. Probable bilateral punctate nonobstructing stones in  both kidneys. Ureters are decompressed, no ureteral stone. There is mild nonspecific bilateral perinephric edema. No bladder stone or wall thickening. Stomach/Bowel: Stomach is within normal limits. Appendix appears normal. No evidence of bowel wall thickening, distention, or inflammatory changes. Vascular/Lymphatic: Aortic atherosclerosis without aneurysm. Aortocaval node at the level of the kidneys measures 11 mm. There are small periaortic nodes. No mesenteric or pelvic sidewall adenopathy. Prominent right inguinal node measures 14 mm short axis. Reproductive: Prostatic and seminal vesicle calcifications. Other: Small fat containing umbilical hernia. No free air, free fluid, or intra-abdominal fluid collection. Small calcified lymph node in the left lower quadrant. Musculoskeletal: Posterior L4-L5 fusion with interbody spacer. Mild chronic L1 compression fracture. There  are no acute or suspicious osseous abnormalities. IMPRESSION: 1. Punctate bilateral nonobstructing nephrolithiasis. No hydronephrosis or obstructive uropathy. 2. Hepatomegaly and hepatic steatosis. 3. A few prominent retroperitoneal and right inguinal lymph nodes, nonspecific but likely reactive in the absence of known malignancy. 4.  Aortic Atherosclerosis (ICD10-I70.0). Electronically Signed   By: Rubye Oaks M.D.   On: 11/24/2016 05:04    No results found.  No results found.    Assessment and Plan: Patient Active Problem List   Diagnosis Date Noted  . CAD (coronary artery disease) 05/14/2017  . Chronic, continuous use of opioids 05/14/2017  . Fatty liver disease, nonalcoholic 05/14/2017  . Obesity (BMI 30-39.9) 05/14/2017  . Spondylolisthesis of lumbar region 02/13/2016  . Cervical herniated disc 07/18/2015  . SOB (shortness of breath) on exertion 07/13/2014  . DDD (degenerative disc disease), lumbar 02/28/2014  . Lumbar radiculitis 01/25/2014  . Chronic bronchitis (HCC) 06/14/2013  . Chronic pain disorder 06/14/2013  . Depression (emotion) 06/14/2013  . Hyperlipidemia, unspecified 06/14/2013  . Hypertension 06/14/2013  . Microalbuminuric diabetic nephropathy (HCC) 06/14/2013  . Tobacco abuse 06/14/2013  . Diabetes type 2, uncontrolled (HCC) 03/31/2006    1. COPD he is going to try to quit smoking on his own. He and I discussed different methods available. Right now he has been able to cut down his numbers. Also needs f/u CXR ordered 2. SOB will get PFT to assess severity of his disease process 3. Smoker discussed smoking cessation at length 4. CAD sees cardiology ?unsure of SOB may also be related to CAD echo to assess  General Counseling: I have discussed the findings of the evaluation and examination with [place].  I have also discussed any further diagnostic evaluation thatmay be needed or ordered today. Alyn verbalizes understanding of the findings of todays visit. We  also reviewed his medications today and discussed drug interactions and side effects including but not limited excessive drowsiness and altered mental states. We also discussed that there is always a risk not just to him but also people around him. he has been encouraged to call the office with any questions or concerns that should arise related to todays visit.    Time spent:  I have personally obtained a history, examined the patient, evaluated laboratory and imaging results, formulated the assessment and plan and placed orders.    Yevonne Pax, MD [facility] Pulmonary and Critical Care Sleep medicine

## 2017-05-21 ENCOUNTER — Ambulatory Visit
Admission: RE | Admit: 2017-05-21 | Discharge: 2017-05-21 | Disposition: A | Discharge status: Home / Self Care | Payer: Medicare HMO | Source: Ambulatory Visit | Attending: Internal Medicine | Admitting: Internal Medicine

## 2017-05-21 DIAGNOSIS — J449 Chronic obstructive pulmonary disease, unspecified: Secondary | ICD-10-CM | POA: Diagnosis present

## 2017-06-03 ENCOUNTER — Ambulatory Visit: Payer: Self-pay | Admitting: Internal Medicine

## 2017-06-10 ENCOUNTER — Ambulatory Visit: Payer: Medicare HMO | Admitting: Internal Medicine

## 2017-06-10 DIAGNOSIS — R0602 Shortness of breath: Secondary | ICD-10-CM

## 2017-06-10 DIAGNOSIS — J449 Chronic obstructive pulmonary disease, unspecified: Secondary | ICD-10-CM

## 2017-06-15 ENCOUNTER — Encounter: Payer: Self-pay | Admitting: Internal Medicine

## 2017-06-15 NOTE — Patient Instructions (Signed)

## 2017-06-15 NOTE — Progress Notes (Signed)
[facility] [address] [place], [id number]  DATE OF SERVICE:  June 10, 2017  Complete Pulmonary Function Testing Interpretation:  FINDINGS:   the forced vital capacity is normal the FEV1 is 2.72 L which is 88% of predicted and is normal limits the FEV1 FVC ratio is normal.  Total lung capacity is normal by body box residual volume is normal residual volume total lung capacity ratio is normal BF air C is normal.  The DLCO is within normal limits.  Post bronchodilator there is no significant change in the FEV1 however clinical improvement may occur in the absence of spirometric improvement does not preclude the use of bronchodilators.  IMPRESSION:    This pulmonary function study is within normal limits.  There is no significant change in the FEV1 post bronchodilator however clinical improvement may occur in the absence of spirometric improvement.  The DLCO is within normal limits  Yevonne PaxSaadat A Khan, MD [facility] Pulmonary Critical Care Medicine Sleep Medicine

## 2017-06-23 ENCOUNTER — Encounter: Payer: Self-pay | Admitting: Internal Medicine

## 2017-06-23 ENCOUNTER — Ambulatory Visit: Payer: Medicare HMO | Admitting: Internal Medicine

## 2017-06-23 VITALS — BP 128/62 | HR 64 | Resp 16 | Ht 66.0 in | Wt 210.0 lb

## 2017-06-23 DIAGNOSIS — J449 Chronic obstructive pulmonary disease, unspecified: Secondary | ICD-10-CM

## 2017-06-23 DIAGNOSIS — F17200 Nicotine dependence, unspecified, uncomplicated: Secondary | ICD-10-CM

## 2017-06-23 DIAGNOSIS — I2583 Coronary atherosclerosis due to lipid rich plaque: Secondary | ICD-10-CM | POA: Diagnosis not present

## 2017-06-23 DIAGNOSIS — I2510 Atherosclerotic heart disease of native coronary artery without angina pectoris: Secondary | ICD-10-CM | POA: Diagnosis not present

## 2017-06-23 NOTE — Progress Notes (Signed)
[facility] [address] [place], [place] [address]  Pulmonary Sleep Medicine  Office Visit Note  Patient Name: Darin Waters DOB: [birth year] MRN [id number]  Date of Service: 06/23/2017  Complaints/HPI: Patient is here follow-up of COPD coronary artery disease.  He says he is doing fairly well.  No admissions to the hospital no cough no congestion.  He had pulmonary function tests which actually showed excellent FEV1 of 88%.  Chest x-ray was also done did not show any masses or nodules.  He says that he has try to quit smoking is using the electronic cigarette at this time  ROS  General: (-) fever, (-) chills, (-) night sweats, (-) weakness Skin: (-) rashes, (-) itching,. Eyes: (-) visual changes, (-) redness, (-) itching. Nose and Sinuses: (-) nasal stuffiness or itchiness, (-) postnasal drip, (-) nosebleeds, (-) sinus trouble. Mouth and Throat: (-) sore throat, (-) hoarseness. Neck: (-) swollen glands, (-) enlarged thyroid, (-) neck pain. Respiratory: - cough, (-) bloody sputum, + shortness of breath, - wheezing. Cardiovascular: - ankle swelling, (-) chest pain. Lymphatic: (-) lymph node enlargement. Neurologic: (-) numbness, (-) tingling. Psychiatric: (-) anxiety, (-) depression   Current Medication: Outpatient Encounter Medications as of 06/23/2017  Medication Sig  . amLODipine-benazepril (LOTREL) 5-20 MG capsule Take 1 capsule by mouth daily before breakfast.   . aspirin 81 MG tablet Take 81 mg by mouth daily.  . citalopram (CELEXA) 10 MG tablet Take 10 mg by mouth daily before breakfast.   . cyclobenzaprine (FLEXERIL) 10 MG tablet Take 1 tablet (10 mg total) by mouth 3 (three) times daily as needed.  . cyclobenzaprine (FLEXERIL) 10 MG tablet Take 1 tablet (10 mg total) by mouth 3 (three) times daily as needed for muscle spasms.  . diazepam (VALIUM) 5 MG tablet Take 1 tablet (5 mg total) by mouth every 8 (eight) hours as needed for muscle spasms.  Marland Kitchen docusate  sodium (COLACE) 100 MG capsule Take 1 capsule (100 mg total) by mouth 2 (two) times daily. (Patient taking differently: Take 100 mg by mouth 2 (two) times daily as needed for mild constipation. )  . docusate sodium (COLACE) 100 MG capsule Take 1 capsule (100 mg total) by mouth 2 (two) times daily.  Marland Kitchen esomeprazole (NEXIUM) 20 MG capsule Take 20 mg by mouth daily before breakfast.  . gabapentin (NEURONTIN) 300 MG capsule Take 1 capsule by mouth 2 (two) times daily.  Marland Kitchen glimepiride (AMARYL) 2 MG tablet Take 2 mg by mouth daily with breakfast.  . metFORMIN (GLUCOPHAGE) 500 MG tablet Take 1,000 mg by mouth 2 (two) times daily with a meal.   . Multiple Vitamins-Minerals (MULTIVITAMIN WITH MINERALS) tablet Take 1 tablet by mouth daily.  Marland Kitchen oxyCODONE-acetaminophen (PERCOCET) 10-325 MG tablet Take 1 tablet by mouth every 4 (four) hours as needed for pain.  Marland Kitchen PROAIR HFA 108 (90 Base) MCG/ACT inhaler Take 2 puffs by mouth every 6 (six) hours as needed for wheezing or shortness of breath.   . quiNINE (QUALAQUIN) 324 MG capsule Take 1 capsule by mouth every other day.   . simvastatin (ZOCOR) 20 MG tablet Take 20 mg by mouth daily at 6 PM.   . SYMBICORT 160-4.5 MCG/ACT inhaler Take 1 puff by mouth 2 (two) times daily.   No facility-administered encounter medications on file as of 06/23/2017.     Surgical History: Past Surgical History:  Procedure Laterality Date  . ANTERIOR CERVICAL DECOMP/DISCECTOMY FUSION N/A 07/18/2015   Procedure: CERVICALFIVE-SIX, CERVICAL SIX-SEVEN ANTERIOR CERVICAL  DECOMPRESSION/DISCECTOMY FUSION;  Surgeon: Tressie Stalker, MD;  Location: [facility];  Service: Neurosurgery;  Laterality: N/A;  C56 C67 anterior cervical decompression with fusion interbody prosthesis plating and bonegraft  . COLONOSCOPY    . RECTAL POLYPECTOMY  4-5 yrs ago    Medical History: Past Medical History:  Diagnosis Date  . Arthritis    DDD- lumbar , thumbs - both hands   . COPD (chronic obstructive  pulmonary disease) (HCC)   . Depression   . Diabetes mellitus without complication (HCC)   . GERD (gastroesophageal reflux disease)   . History of kidney stones 1990's    lithotripsy done   . Hypertension   . Sleep apnea 01/30/2016   [facility] in [place], results not avail to pt. yet    Family History: Family History  Problem Relation Age of Onset  . Diabetes Mother   . Asthma Mother   . Hypertension Mother   . Hyperlipidemia Mother   . Hyperlipidemia Father   . Dementia Father   . Hypertension Father   . Heart Problems Father        pace maker    Social History: Social History   Socioeconomic History  . Marital status: Married    Spouse name: Not on file  . Number of children: Not on file  . Years of education: Not on file  . Highest education level: Not on file  Occupational History  . Not on file  Social Needs  . Financial resource strain: Not on file  . Food insecurity:    Worry: Not on file    Inability: Not on file  . Transportation needs:    Medical: Not on file    Non-medical: Not on file  Tobacco Use  . Smoking status: Former Smoker    Packs/day: 2.50    Years: 45.00    Pack years: 112.50    Last attempt to quit: 05/2016    Years since quitting: 1.1  . Smokeless tobacco: Never Used  Substance and Sexual Activity  . Alcohol use: No  . Drug use: No  . Sexual activity: Not on file  Lifestyle  . Physical activity:    Days per week: Not on file    Minutes per session: Not on file  . Stress: Not on file  Relationships  . Social connections:    Talks on phone: Not on file    Gets together: Not on file    Attends religious service: Not on file    Active member of club or organization: Not on file    Attends meetings of clubs or organizations: Not on file    Relationship status: Not on file  . Intimate partner violence:    Fear of current or ex partner: Not on file    Emotionally abused: Not on file    Physically abused: Not on file     Forced sexual activity: Not on file  Other Topics Concern  . Not on file  Social History Narrative  . Not on file    Vital Signs: Blood pressure 128/62, pulse 64, resp. rate 16, height 5\' 6"  (1.676 m), weight 210 lb (95.3 kg), SpO2 94 %.  Examination: General Appearance: The patient is well-developed, well-nourished, and in no distress. Skin: Gross inspection of skin unremarkable. Head: normocephalic, no gross deformities. Eyes: no gross deformities noted. ENT: ears appear grossly normal no exudates. Neck: Supple. No thyromegaly. No LAD. Respiratory: no rhonchi. Cardiovascular: Normal S1 and S2 without murmur or  rub. Extremities: No cyanosis. pulses are equal. Neurologic: Alert and oriented. No involuntary movements.  LABS: No results found for this or any previous visit (from the past 2160 hour(s)).  Radiology: Dg Chest 2 View  Result Date: 05/21/2017 CLINICAL DATA:  Cough, shortness of breath. EXAM: CHEST  2 VIEW COMPARISON:  Radiographs of January 24, 2016. FINDINGS: The heart size and mediastinal contours are within normal limits. Both lungs are clear. No pneumothorax or pleural effusion is noted. The visualized skeletal structures are unremarkable. IMPRESSION: No active cardiopulmonary disease. Electronically Signed   By: Lupita RaiderJames  Green Jr, M.D.   On: 05/21/2017 14:34    No results found.  No results found.    Assessment and Plan: Patient Active Problem List   Diagnosis Date Noted  . CAD (coronary artery disease) 05/14/2017  . Chronic, continuous use of opioids 05/14/2017  . Fatty liver disease, nonalcoholic 05/14/2017  . Obesity (BMI 30-39.9) 05/14/2017  . Spondylolisthesis of lumbar region 02/13/2016  . Cervical herniated disc 07/18/2015  . SOB (shortness of breath) on exertion 07/13/2014  . DDD (degenerative disc disease), lumbar 02/28/2014  . Lumbar radiculitis 01/25/2014  . Chronic bronchitis (HCC) 06/14/2013  . Chronic pain disorder 06/14/2013  . Depression  (emotion) 06/14/2013  . Hyperlipidemia, unspecified 06/14/2013  . Hypertension 06/14/2013  . Microalbuminuric diabetic nephropathy (HCC) 06/14/2013  . Tobacco abuse 06/14/2013  . Diabetes type 2, uncontrolled (HCC) 03/31/2006    1. COPD actually doing fairly well is PFTs within normal range.  We will continue with supportive care he will continue with present management follow up in about 4 months 2. Nicotine abuse he is trying to quit smoking will continue to monitor closely 3. CAD stable at this time chest pain 4. Obesity  General Counseling: I have discussed the findings of the evaluation and examination with [place].  I have also discussed any further diagnostic evaluation thatmay be needed or ordered today. Eulises verbalizes understanding of the findings of todays visit. We also reviewed his medications today and discussed drug interactions and side effects including but not limited excessive drowsiness and altered mental states. We also discussed that there is always a risk not just to him but also people around him. he has been encouraged to call the office with any questions or concerns that should arise related to todays visit.    Time spent: 15min  I have personally obtained a history, examined the patient, evaluated laboratory and imaging results, formulated the assessment and plan and placed orders.    Yevonne PaxSaadat A Vida Nicol, MD [facility] Pulmonary and Critical Care Sleep medicine

## 2017-06-23 NOTE — Patient Instructions (Signed)

## 2017-10-26 ENCOUNTER — Encounter: Payer: Self-pay | Admitting: Adult Health

## 2017-10-26 ENCOUNTER — Ambulatory Visit (INDEPENDENT_AMBULATORY_CARE_PROVIDER_SITE_OTHER): Payer: Medicare HMO | Admitting: Adult Health

## 2017-10-26 VITALS — BP 140/74 | HR 57 | Resp 16 | Ht 66.0 in | Wt 217.2 lb

## 2017-10-26 DIAGNOSIS — J449 Chronic obstructive pulmonary disease, unspecified: Secondary | ICD-10-CM

## 2017-10-26 DIAGNOSIS — F17200 Nicotine dependence, unspecified, uncomplicated: Secondary | ICD-10-CM | POA: Diagnosis not present

## 2017-10-26 DIAGNOSIS — R0602 Shortness of breath: Secondary | ICD-10-CM | POA: Diagnosis not present

## 2017-10-26 NOTE — Patient Instructions (Signed)
Coping with Quitting Smoking Quitting smoking is a physical and mental challenge. You will face cravings, withdrawal symptoms, and temptation. Before quitting, work with your health care provider to make a plan that can help you cope. Preparation can help you quit and keep you from giving in. How can I cope with cravings? Cravings usually last for 5-10 minutes. If you get through it, the craving will pass. Consider taking the following actions to help you cope with cravings:  Keep your mouth busy: ? Chew sugar-free gum. ? Suck on hard candies or a straw. ? Brush your teeth.  Keep your hands and body busy: ? Immediately change to a different activity when you feel a craving. ? Squeeze or play with a ball. ? Do an activity or a hobby, like making bead jewelry, practicing needlepoint, or working with wood. ? Mix up your normal routine. ? Take a short exercise break. Go for a quick walk or run up and down stairs. ? Spend time in public places where smoking is not allowed.  Focus on doing something kind or helpful for someone else.  Call a friend or family member to talk during a craving.  Join a support group.  Call a quit line, such as 1-800-QUIT-NOW.  Talk with your health care provider about medicines that might help you cope with cravings and make quitting easier for you.  How can I deal with withdrawal symptoms? Your body may experience negative effects as it tries to get used to not having nicotine in the system. These effects are called withdrawal symptoms. They may include:  Feeling hungrier than normal.  Trouble concentrating.  Irritability.  Trouble sleeping.  Feeling depressed.  Restlessness and agitation.  Craving a cigarette.  To manage withdrawal symptoms:  Avoid places, people, and activities that trigger your cravings.  Remember why you want to quit.  Get plenty of sleep.  Avoid coffee and other caffeinated drinks. These may worsen some of your  symptoms.  How can I handle social situations? Social situations can be difficult when you are quitting smoking, especially in the first few weeks. To manage this, you can:  Avoid parties, bars, and other social situations where people might be smoking.  Avoid alcohol.  Leave right away if you have the urge to smoke.  Explain to your family and friends that you are quitting smoking. Ask for understanding and support.  Plan activities with friends or family where smoking is not an option.  What are some ways I can cope with stress? Wanting to smoke may cause stress, and stress can make you want to smoke. Find ways to manage your stress. Relaxation techniques can help. For example:  Breathe slowly and deeply, in through your nose and out through your mouth.  Listen to soothing, relaxing music.  Talk with a family member or friend about your stress.  Light a candle.  Soak in a bath or take a shower.  Think about a peaceful place.  What are some ways I can prevent weight gain? Be aware that many people gain weight after they quit smoking. However, not everyone does. To keep from gaining weight, have a plan in place before you quit and stick to the plan after you quit. Your plan should include:  Having healthy snacks. When you have a craving, it may help to: ? Eat plain popcorn, crunchy carrots, celery, or other cut vegetables. ? Chew sugar-free gum.  Changing how you eat: ? Eat small portion sizes at meals. ?   Eat 4-6 small meals throughout the day instead of 1-2 large meals a day. ? Be mindful when you eat. Do not watch television or do other things that might distract you as you eat.  Exercising regularly: ? Make time to exercise each day. If you do not have time for a long workout, do short bouts of exercise for 5-10 minutes several times a day. ? Do some form of strengthening exercise, like weight lifting, and some form of aerobic exercise, like running or  swimming.  Drinking plenty of water or other low-calorie or no-calorie drinks. Drink 6-8 glasses of water daily, or as much as instructed by your health care provider.  Summary  Quitting smoking is a physical and mental challenge. You will face cravings, withdrawal symptoms, and temptation to smoke again. Preparation can help you as you go through these challenges.  You can cope with cravings by keeping your mouth busy (such as by chewing gum), keeping your body and hands busy, and making calls to family, friends, or a helpline for people who want to quit smoking.  You can cope with withdrawal symptoms by avoiding places where people smoke, avoiding drinks with caffeine, and getting plenty of rest.  Ask your health care provider about the different ways to prevent weight gain, avoid stress, and handle social situations. This information is not intended to replace advice given to you by your health care provider. Make sure you discuss any questions you have with your health care provider. Document Released: 03/14/2016 Document Revised: 03/14/2016 Document Reviewed: 03/14/2016 Elsevier Interactive Patient Education  2018 [employer]  

## 2017-10-26 NOTE — Progress Notes (Signed)
[facility] [address] [place], [place] [address]  Internal MEDICINE  Office Visit Note  Patient Name: Darin Waters  [id number]  [id number]  Date of Service: 10/26/2017  Chief Complaint  Patient presents with  . COPD    COPD  There is no chest tightness, cough, difficulty breathing, shortness of breath, sputum production or wheezing. This is a chronic problem. The current episode started more than 1 year ago. Pertinent negatives include no chest pain, rhinorrhea, sneezing or sore throat. His past medical history is significant for COPD.  He reports occasional use of his rescue inhaler when he over exerts himself.  He had a chest X-ray in February of this year, that was unremarkable.  His Cleda Daub today was WNL.       Current Medication: Outpatient Encounter Medications as of 10/26/2017  Medication Sig  . amLODipine-benazepril (LOTREL) 5-20 MG capsule Take 1 capsule by mouth daily before breakfast.   . aspirin 81 MG tablet Take 81 mg by mouth daily.  . citalopram (CELEXA) 10 MG tablet Take 10 mg by mouth daily before breakfast.   . cyclobenzaprine (FLEXERIL) 10 MG tablet Take 1 tablet (10 mg total) by mouth 3 (three) times daily as needed.  . diazepam (VALIUM) 5 MG tablet Take 1 tablet (5 mg total) by mouth every 8 (eight) hours as needed for muscle spasms.  Marland Kitchen docusate sodium (COLACE) 100 MG capsule Take 1 capsule (100 mg total) by mouth 2 (two) times daily. (Patient taking differently: Take 100 mg by mouth 2 (two) times daily as needed for mild constipation. )  . esomeprazole (NEXIUM) 20 MG capsule Take 20 mg by mouth daily before breakfast.  . furosemide (LASIX) 20 MG tablet Take 20 mg by mouth daily.  Marland Kitchen gabapentin (NEURONTIN) 300 MG capsule Take 1 capsule by mouth 2 (two) times daily.  Marland Kitchen glimepiride (AMARYL) 2 MG tablet Take 2 mg by mouth daily with breakfast.  . metFORMIN (GLUCOPHAGE) 500 MG tablet Take 1,000 mg by mouth 2 (two) times daily with a meal.   .  Multiple Vitamins-Minerals (MULTIVITAMIN WITH MINERALS) tablet Take 1 tablet by mouth daily.  Marland Kitchen oxyCODONE-acetaminophen (PERCOCET) 10-325 MG tablet Take 1 tablet by mouth every 4 (four) hours as needed for pain.  Marland Kitchen PROAIR HFA 108 (90 Base) MCG/ACT inhaler Take 2 puffs by mouth every 6 (six) hours as needed for wheezing or shortness of breath.   . quiNINE (QUALAQUIN) 324 MG capsule Take 1 capsule by mouth every other day.   . simvastatin (ZOCOR) 20 MG tablet Take 20 mg by mouth daily at 6 PM.   . SYMBICORT 160-4.5 MCG/ACT inhaler Take 1 puff by mouth 2 (two) times daily.  . [DISCONTINUED] docusate sodium (COLACE) 100 MG capsule Take 1 capsule (100 mg total) by mouth 2 (two) times daily.  . [DISCONTINUED] cyclobenzaprine (FLEXERIL) 10 MG tablet Take 1 tablet (10 mg total) by mouth 3 (three) times daily as needed for muscle spasms.   No facility-administered encounter medications on file as of 10/26/2017.     Surgical History: Past Surgical History:  Procedure Laterality Date  . ANTERIOR CERVICAL DECOMP/DISCECTOMY FUSION N/A 07/18/2015   Procedure: CERVICALFIVE-SIX, CERVICAL SIX-SEVEN ANTERIOR CERVICAL DECOMPRESSION/DISCECTOMY FUSION;  Surgeon: Tressie Stalker, MD;  Location: [facility];  Service: Neurosurgery;  Laterality: N/A;  C56 C67 anterior cervical decompression with fusion interbody prosthesis plating and bonegraft  . COLONOSCOPY    . RECTAL POLYPECTOMY  4-5 yrs ago    Medical History: Past Medical History:  Diagnosis Date  . Arthritis    DDD- lumbar , thumbs - both hands   . COPD (chronic obstructive pulmonary disease) (HCC)   . Depression   . Diabetes mellitus without complication (HCC)   . GERD (gastroesophageal reflux disease)   . History of kidney stones 1990's    lithotripsy done   . Hypertension   . Sleep apnea 01/30/2016   [facility] [place], results not avail to pt. yet    Family History: Family History  Problem Relation Age of Onset  . Diabetes Mother    . Asthma Mother   . Hypertension Mother   . Hyperlipidemia Mother   . Hyperlipidemia Father   . Dementia Father   . Hypertension Father   . Heart Problems Father        pace maker    Social History   Socioeconomic History  . Marital status: Married    Spouse name: Not on file  . Number of children: Not on file  . Years of education: Not on file  . Highest education level: Not on file  Occupational History  . Not on file  Social Needs  . Financial resource strain: Not on file  . Food insecurity:    Worry: Not on file    Inability: Not on file  . Transportation needs:    Medical: Not on file    Non-medical: Not on file  Tobacco Use  . Smoking status: Former Smoker    Packs/day: 2.50    Years: 45.00    Pack years: 112.50    Last attempt to quit: 05/2016    Years since quitting: 1.4  . Smokeless tobacco: Never Used  Substance and Sexual Activity  . Alcohol use: No  . Drug use: No  . Sexual activity: Not on file  Lifestyle  . Physical activity:    Days per week: Not on file    Minutes per session: Not on file  . Stress: Not on file  Relationships  . Social connections:    Talks on phone: Not on file    Gets together: Not on file    Attends religious service: Not on file    Active member of club or organization: Not on file    Attends meetings of clubs or organizations: Not on file    Relationship status: Not on file  . Intimate partner violence:    Fear of current or ex partner: Not on file    Emotionally abused: Not on file    Physically abused: Not on file    Forced sexual activity: Not on file  Other Topics Concern  . Not on file  Social History Narrative  . Not on file      Review of Systems  Constitutional: Negative.  Negative for chills, fatigue and unexpected weight change.  HENT: Negative.  Negative for congestion, rhinorrhea, sneezing and sore throat.   Eyes: Negative for redness.  Respiratory: Negative.  Negative for cough, sputum  production, chest tightness, shortness of breath and wheezing.   Cardiovascular: Negative.  Negative for chest pain and palpitations.  Gastrointestinal: Negative.  Negative for abdominal pain, constipation, diarrhea, nausea and vomiting.  Endocrine: Negative.   Genitourinary: Negative.  Negative for dysuria and frequency.  Musculoskeletal: Negative.  Negative for arthralgias, back pain, joint swelling and neck pain.  Skin: Negative.  Negative for rash.  Allergic/Immunologic: Negative.   Neurological: Negative.  Negative for tremors and numbness.  Hematological: Negative for adenopathy. Does not bruise/bleed easily.  Psychiatric/Behavioral: Negative.  Negative for behavioral problems, sleep disturbance and suicidal ideas. The patient is not nervous/anxious.     Vital Signs: BP 140/74   Pulse (!) 57   Resp 16   Ht 5\' 6"  (1.676 m)   Wt 217 lb 3.2 oz (98.5 kg)   SpO2 93%   BMI 35.06 kg/m    Physical Exam  Constitutional: He is oriented to person, place, and time. He appears well-developed and well-nourished. No distress.  HENT:  Head: Normocephalic and atraumatic.  Mouth/Throat: Oropharynx is clear and moist. No oropharyngeal exudate.  Eyes: Pupils are equal, round, and reactive to light. EOM are normal.  Neck: Normal range of motion. Neck supple. No JVD present. No tracheal deviation present. No thyromegaly present.  Cardiovascular: Normal rate, regular rhythm and normal heart sounds. Exam reveals no gallop and no friction rub.  No murmur heard. Pulmonary/Chest: Effort normal and breath sounds normal. No respiratory distress. He has no wheezes. He has no rales. He exhibits no tenderness.  Abdominal: Soft. There is no tenderness. There is no guarding.  Musculoskeletal: Normal range of motion.  Lymphadenopathy:    He has no cervical adenopathy.  Neurological: He is alert and oriented to person, place, and time. No cranial nerve deficit.  Skin: Skin is warm and dry. He is not  diaphoretic.  Psychiatric: He has a normal mood and affect. His behavior is normal. Judgment and thought content normal.  Nursing note and vitals reviewed.  Assessment/Plan: 1. Obstructive chronic bronchitis without exacerbation (HCC) No episodes of exacerbation.   2. Nicotine dependence with current use Continues to use eletronic cigerette.  He is using the least amount of nicotine possible. 2.4mg  Smoking cessation counseling: 1. Pt acknowledges the risks of long term smoking, he will try to quite smoking. 2. Options for different medications including nicotine products, chewing gum, patch etc, Wellbutrin and Chantix is discussed 3. Goal and date of compete cessation is discussed 4. Total time spent in smoking cessation is 10 min.  3. Morbid obesity (HCC) Obesity Counseling: Risk Assessment: An assessment of behavioral risk factors was made today and includes lack of exercise sedentary lifestyle, lack of portion control and poor dietary habits.  Risk Modification Advice: She was counseled on portion control guidelines. Restricting daily caloric intake to. . The detrimental long term effects of obesity on her health and ongoing poor compliance was also discussed with the patient.   4. SOB (shortness of breath) - Spirometry with Graph  General Counseling: Falon verbalizes understanding of the findings of todays visit and agrees with plan of treatment. I have discussed any further diagnostic evaluation that may be needed or ordered today. We also reviewed his medications today. he has been encouraged to call the office with any questions or concerns that should arise related to todays visit.    Orders Placed This Encounter  Procedures  . Spirometry with Graph    No orders of the defined types were placed in this encounter.   Time spent: 25 Minutes   This patient was seen by Blima Ledger AGNP-C in Collaboration with Dr Lyndon Code as a part of collaborative care agreement     Dr Lyndon Code Internal medicine

## 2017-11-02 ENCOUNTER — Encounter: Payer: Self-pay | Admitting: Adult Health

## 2018-04-29 ENCOUNTER — Encounter: Payer: Self-pay | Admitting: Adult Health

## 2018-04-29 ENCOUNTER — Ambulatory Visit: Payer: Medicare HMO | Admitting: Adult Health

## 2018-04-29 VITALS — BP 142/78 | HR 56 | Resp 16 | Ht 66.0 in | Wt 222.0 lb

## 2018-04-29 DIAGNOSIS — J449 Chronic obstructive pulmonary disease, unspecified: Secondary | ICD-10-CM

## 2018-04-29 DIAGNOSIS — J4489 Other specified chronic obstructive pulmonary disease: Secondary | ICD-10-CM

## 2018-04-29 DIAGNOSIS — R0602 Shortness of breath: Secondary | ICD-10-CM | POA: Diagnosis not present

## 2018-04-29 DIAGNOSIS — I10 Essential (primary) hypertension: Secondary | ICD-10-CM

## 2018-04-29 DIAGNOSIS — F17200 Nicotine dependence, unspecified, uncomplicated: Secondary | ICD-10-CM | POA: Diagnosis not present

## 2018-04-29 NOTE — Progress Notes (Addendum)
[facility] [address] [place], [place] [address]  Pulmonary Sleep Medicine   Office Visit Note  Patient Name: Darin Waters DOB: December 26, 1960 MRN [id number]  Date of Service: 05/16/2018  Complaints/HPI: Pt is here for follow up,   ROS  General: (-) fever, (-) chills, (-) night sweats, (-) weakness Skin: (-) rashes, (-) itching,. Eyes: (-) visual changes, (-) redness, (-) itching. Nose and Sinuses: (-) nasal stuffiness or itchiness, (-) postnasal drip, (-) nosebleeds, (-) sinus trouble. Mouth and Throat: (-) sore throat, (-) hoarseness. Neck: (-) swollen glands, (-) enlarged thyroid, (-) neck pain. Respiratory: - cough, (-) bloody sputum, - shortness of breath, - wheezing. Cardiovascular: - ankle swelling, (-) chest pain. Lymphatic: (-) lymph node enlargement. Neurologic: (-) numbness, (-) tingling. Psychiatric: (-) anxiety, (-) depression   Current Medication: Outpatient Encounter Medications as of 04/29/2018  Medication Sig  . amLODipine-benazepril (LOTREL) 5-20 MG capsule Take 1 capsule by mouth daily before breakfast.   . aspirin 81 MG tablet Take 81 mg by mouth daily.  . citalopram (CELEXA) 10 MG tablet Take 10 mg by mouth daily before breakfast.   . cyclobenzaprine (FLEXERIL) 10 MG tablet Take 1 tablet (10 mg total) by mouth 3 (three) times daily as needed.  . docusate sodium (COLACE) 100 MG capsule Take 1 capsule (100 mg total) by mouth 2 (two) times daily. (Patient taking differently: Take 100 mg by mouth 2 (two) times daily as needed for mild constipation. )  . esomeprazole (NEXIUM) 20 MG capsule Take 20 mg by mouth daily before breakfast.  . furosemide (LASIX) 20 MG tablet Take 20 mg by mouth daily.  Marland Kitchen gabapentin (NEURONTIN) 300 MG capsule Take 1 capsule by mouth 2 (two) times daily.  Marland Kitchen glimepiride (AMARYL) 2 MG tablet Take 2 mg by mouth daily with breakfast.  . Insulin Glargine (LANTUS SOLOSTAR) 100 UNIT/ML Solostar Pen Inject 34 Units into the skin  at bedtime.   . metFORMIN (GLUCOPHAGE) 500 MG tablet Take 1,000 mg by mouth 2 (two) times daily with a meal.   . Multiple Vitamins-Minerals (MULTIVITAMIN WITH MINERALS) tablet Take 1 tablet by mouth daily.  Marland Kitchen oxyCODONE-acetaminophen (PERCOCET) 10-325 MG tablet Take 1 tablet by mouth every 4 (four) hours as needed for pain.  Marland Kitchen PROAIR HFA 108 (90 Base) MCG/ACT inhaler Take 2 puffs by mouth every 6 (six) hours as needed for wheezing or shortness of breath.   . quiNINE (QUALAQUIN) 324 MG capsule Take 1 capsule by mouth every other day.   . simvastatin (ZOCOR) 20 MG tablet Take 20 mg by mouth daily at 6 PM.   . SYMBICORT 160-4.5 MCG/ACT inhaler Take 1 puff by mouth 2 (two) times daily.   No facility-administered encounter medications on file as of 04/29/2018.     Surgical History: Past Surgical History:  Procedure Laterality Date  . ANTERIOR CERVICAL DECOMP/DISCECTOMY FUSION N/A 07/18/2015   Procedure: CERVICALFIVE-SIX, CERVICAL SIX-SEVEN ANTERIOR CERVICAL DECOMPRESSION/DISCECTOMY FUSION;  Surgeon: Tressie Stalker, MD;  Location: [facility];  Service: Neurosurgery;  Laterality: N/A;  C56 C67 anterior cervical decompression with fusion interbody prosthesis plating and bonegraft  . COLONOSCOPY    . RECTAL POLYPECTOMY  4-5 yrs ago    Medical History: Past Medical History:  Diagnosis Date  . Arthritis    DDD- lumbar , thumbs - both hands   . COPD (chronic obstructive pulmonary disease) (HCC)   . Depression   . Diabetes mellitus without complication (HCC)   . GERD (gastroesophageal reflux disease)   .  History of kidney stones 1990's    lithotripsy done   . Hypertension   . Sleep apnea 01/30/2016   [facility] [place], results not avail to pt. yet    Family History: Family History  Problem Relation Age of Onset  . Diabetes Mother   . Asthma Mother   . Hypertension Mother   . Hyperlipidemia Mother   . Hyperlipidemia Father   . Dementia Father   . Hypertension Father   .  Heart Problems Father        pace maker    Social History: Social History   Socioeconomic History  . Marital status: Married    Spouse name: Not on file  . Number of children: Not on file  . Years of education: Not on file  . Highest education level: Not on file  Occupational History  . Not on file  Social Needs  . Financial resource strain: Not on file  . Food insecurity:    Worry: Not on file    Inability: Not on file  . Transportation needs:    Medical: Not on file    Non-medical: Not on file  Tobacco Use  . Smoking status: Former Smoker    Packs/day: 2.50    Years: 45.00    Pack years: 112.50    Last attempt to quit: 05/2016    Years since quitting: 2.0  . Smokeless tobacco: Never Used  Substance and Sexual Activity  . Alcohol use: No  . Drug use: No  . Sexual activity: Not on file  Lifestyle  . Physical activity:    Days per week: Not on file    Minutes per session: Not on file  . Stress: Not on file  Relationships  . Social connections:    Talks on phone: Not on file    Gets together: Not on file    Attends religious service: Not on file    Active member of club or organization: Not on file    Attends meetings of clubs or organizations: Not on file    Relationship status: Not on file  . Intimate partner violence:    Fear of current or ex partner: Not on file    Emotionally abused: Not on file    Physically abused: Not on file    Forced sexual activity: Not on file  Other Topics Concern  . Not on file  Social History Narrative  . Not on file    Vital Signs: Blood pressure (!) 142/78, pulse (!) 56, resp. rate 16, height 5\' 6"  (1.676 m), weight 222 lb (100.7 kg), SpO2 94 %.  Examination: General Appearance: The patient is well-developed, well-nourished, and in no distress. Skin: Gross inspection of skin unremarkable. Head: normocephalic, no gross deformities. Eyes: no gross deformities noted. ENT: ears appear grossly normal no exudates. Neck:  Supple. No thyromegaly. No LAD. Respiratory: clear bilaterally. Cardiovascular: Normal S1 and S2 without murmur or rub. Extremities: No cyanosis. pulses are equal. Neurologic: Alert and oriented. No involuntary movements.  LABS: No results found for this or any previous visit (from the past 2160 hour(s)).  Radiology: Dg Chest 2 View  Result Date: 05/21/2017 CLINICAL DATA:  Cough, shortness of breath. EXAM: CHEST  2 VIEW COMPARISON:  Radiographs of January 24, 2016. FINDINGS: The heart size and mediastinal contours are within normal limits. Both lungs are clear. No pneumothorax or pleural effusion is noted. The visualized skeletal structures are unremarkable. IMPRESSION: No active cardiopulmonary disease. Electronically Signed   By:  Lupita Raider, M.D.   On: 05/21/2017 14:34    No results found.  No results found.    Assessment and Plan: Patient Active Problem List   Diagnosis Date Noted  . Nicotine dependence with current use 06/23/2017  . Morbid obesity (HCC) 06/23/2017  . Coronary artery disease due to lipid rich plaque 05/14/2017  . Chronic, continuous use of opioids 05/14/2017  . Fatty liver disease, nonalcoholic 05/14/2017  . Obesity (BMI 30-39.9) 05/14/2017  . Spondylolisthesis of lumbar region 02/13/2016  . Cervical herniated disc 07/18/2015  . SOB (shortness of breath) on exertion 07/13/2014  . DDD (degenerative disc disease), lumbar 02/28/2014  . Lumbar radiculitis 01/25/2014  . Obstructive chronic bronchitis without exacerbation (HCC) 06/14/2013  . Chronic pain disorder 06/14/2013  . Depression (emotion) 06/14/2013  . Hyperlipidemia, unspecified 06/14/2013  . Hypertension 06/14/2013  . Microalbuminuric diabetic nephropathy (HCC) 06/14/2013  . Tobacco abuse 06/14/2013  . Diabetes type 2, uncontrolled (HCC) 03/31/2006   1. Obstructive chronic bronchitis without exacerbation (HCC) Patient continues to do fairly well.  His spirometry today looks good.  Continue  present management. Encourage continued use of inhalers.  Will follow with patient in 4 to 6 months.  2. Nicotine dependence with current use Using Vape, has not smoked cigarettes since March 2019 Smoking cessation counseling: 1. Pt acknowledges the risks of long term smoking, she will try to quite smoking. 2. Options for different medications including nicotine products, chewing gum, patch etc, Wellbutrin and Chantix is discussed 3. Goal and date of compete cessation is discussed 4. Total time spent in smoking cessation is 15 min.  3. Morbid obesity (HCC) Obesity Counseling: Risk Assessment: An assessment of behavioral risk factors was made today and includes lack of exercise sedentary lifestyle, lack of portion control and poor dietary habits.  Risk Modification Advice: She was counseled on portion control guidelines. Restricting daily caloric intake to. . The detrimental long term effects of obesity on her health and ongoing poor compliance was also discussed with the patient.  4. SOB (shortness of breath) FVC is 3.3 279% of the pre-predicted value FEV1 is 2.5 which is 80% of the pre-predicted value FEV1/FVC is 77% which is 101% of the pre-predicted value on today spirometry. - Spirometry with Graph  5. Hypertension, unspecified type Patients blood pressure 142/78.  Given his previous blood pressures at office visit this is just slightly above normal for him.  Encouraged him to see his PCP.  Patient reports he is unsure if he took his blood pressure medication this morning he will go home and check.  It is of note that his pulse was found to be 57 bpm.  When I rechecked his pulse in the exam room it was found to be 66.  General Counseling: I have discussed the findings of the evaluation and examination with Maaz.  I have also discussed any further diagnostic evaluation thatmay be needed or ordered today. Caroline verbalizes understanding of the findings of todays visit. We also reviewed  his medications today and discussed drug interactions and side effects including but not limited excessive drowsiness and altered mental states. We also discussed that there is always a risk not just to him but also people around him. he has been encouraged to call the office with any questions or concerns that should arise related to todays visit.    Time spent: 25 This patient was seen by Blima Ledger AGNP-C in Collaboration with Dr. Freda Munro as a part of collaborative care agreement.  I have personally obtained a history, examined the patient, evaluated laboratory and imaging results, formulated the assessment and plan and placed orders.    Allyne Gee, MD [facility] Pulmonary and Critical Care Sleep medicine

## 2018-04-29 NOTE — Patient Instructions (Signed)
Chronic Obstructive Pulmonary Disease Chronic obstructive pulmonary disease (COPD) is a long-term (chronic) lung problem. When you have COPD, it is hard for air to get in and out of your lungs. Usually the condition gets worse over time, and your lungs will never return to normal. There are things you can do to keep yourself as healthy as possible.  Your doctor may treat your condition with: ? Medicines. ? Oxygen. ? Lung surgery.  Your doctor may also recommend: ? Rehabilitation. This includes steps to make your body work better. It may involve a team of specialists. ? Quitting smoking, if you smoke. ? Exercise and changes to your diet. ? Comfort measures (palliative care). Follow these instructions at home: Medicines  Take over-the-counter and prescription medicines only as told by your doctor.  Talk to your doctor before taking any cough or allergy medicines. You may need to avoid medicines that cause your lungs to be dry. Lifestyle  If you smoke, stop. Smoking makes the problem worse. If you need help quitting, ask your doctor.  Avoid being around things that make your breathing worse. This may include smoke, chemicals, and fumes.  Stay active, but remember to rest as well.  Learn and use tips on how to relax.  Make sure you get enough sleep. Most adults need at least 7 hours of sleep every night.  Eat healthy foods. Eat smaller meals more often. Rest before meals. Controlled breathing Learn and use tips on how to control your breathing as told by your doctor. Try:  Breathing in (inhaling) through your nose for 1 second. Then, pucker your lips and breath out (exhale) through your lips for 2 seconds.  Putting one hand on your belly (abdomen). Breathe in slowly through your nose for 1 second. Your hand on your belly should move out. Pucker your lips and breathe out slowly through your lips. Your hand on your belly should move in as you breathe out.  Controlled coughing Learn  and use controlled coughing to clear mucus from your lungs. Follow these steps: 1. Lean your head a little forward. 2. Breathe in deeply. 3. Try to hold your breath for 3 seconds. 4. Keep your mouth slightly open while coughing 2 times. 5. Spit any mucus out into a tissue. 6. Rest and do the steps again 1 or 2 times as needed. General instructions  Make sure you get all the shots (vaccines) that your doctor recommends. Ask your doctor about a flu shot and a pneumonia shot.  Use oxygen therapy and pulmonary rehabilitation if told by your doctor. If you need home oxygen therapy, ask your doctor if you should buy a tool to measure your oxygen level (oximeter).  Make a COPD action plan with your doctor. This helps you to know what to do if you feel worse than usual.  Manage any other conditions you have as told by your doctor.  Avoid going outside when it is very hot, cold, or humid.  Avoid people who have a sickness you can catch (contagious).  Keep all follow-up visits as told by your doctor. This is important. Contact a doctor if:  You cough up more mucus than usual.  There is a change in the color or thickness of the mucus.  It is harder to breathe than usual.  Your breathing is faster than usual.  You have trouble sleeping.  You need to use your medicines more often than usual.  You have trouble doing your normal activities such as getting dressed   or walking around the house. Get help right away if:  You have shortness of breath while resting.  You have shortness of breath that stops you from: ? Being able to talk. ? Doing normal activities.  Your chest hurts for longer than 5 minutes.  Your skin color is more blue than usual.  Your pulse oximeter shows that you have low oxygen for longer than 5 minutes.  You have a fever.  You feel too tired to breathe normally. Summary  Chronic obstructive pulmonary disease (COPD) is a long-term lung problem.  The way your  lungs work will never return to normal. Usually the condition gets worse over time. There are things you can do to keep yourself as healthy as possible.  Take over-the-counter and prescription medicines only as told by your doctor.  If you smoke, stop. Smoking makes the problem worse. This information is not intended to replace advice given to you by your health care provider. Make sure you discuss any questions you have with your health care provider. Document Released: 09/03/2007 Document Revised: 04/21/2016 Document Reviewed: 04/21/2016 Elsevier Interactive Patient Education  2019 [employer]  

## 2018-05-16 ENCOUNTER — Encounter: Payer: Self-pay | Admitting: Adult Health

## 2018-09-01 ENCOUNTER — Other Ambulatory Visit (HOSPITAL_COMMUNITY): Payer: Self-pay | Admitting: Neurosurgery

## 2018-09-01 ENCOUNTER — Other Ambulatory Visit: Payer: Self-pay | Admitting: Neurosurgery

## 2018-09-01 DIAGNOSIS — M5416 Radiculopathy, lumbar region: Secondary | ICD-10-CM

## 2018-09-13 ENCOUNTER — Ambulatory Visit
Admission: RE | Admit: 2018-09-13 | Discharge: 2018-09-13 | Disposition: A | Discharge status: Home / Self Care | Payer: Medicare HMO | Source: Ambulatory Visit | Attending: Neurosurgery | Admitting: Neurosurgery

## 2018-09-13 ENCOUNTER — Other Ambulatory Visit: Payer: Self-pay

## 2018-09-13 DIAGNOSIS — M5416 Radiculopathy, lumbar region: Secondary | ICD-10-CM | POA: Insufficient documentation

## 2018-09-13 LAB — POCT I-STAT CREATININE: Creatinine, Ser: 0.9 mg/dL (ref 0.61–1.24)

## 2018-09-13 MED ORDER — GADOBUTROL 1 MMOL/ML IV SOLN
10.0000 mL | Freq: Once | INTRAVENOUS | Status: AC | PRN
  Administered 2018-09-13: 10 mL via INTRAVENOUS

## 2018-10-28 ENCOUNTER — Encounter: Payer: Self-pay | Admitting: Internal Medicine

## 2018-10-28 ENCOUNTER — Other Ambulatory Visit: Payer: Self-pay

## 2018-10-28 ENCOUNTER — Ambulatory Visit: Payer: Medicare HMO | Admitting: Internal Medicine

## 2018-10-28 VITALS — BP 137/68 | HR 61 | Resp 16 | Ht 66.0 in | Wt 224.0 lb

## 2018-10-28 DIAGNOSIS — F17201 Nicotine dependence, unspecified, in remission: Secondary | ICD-10-CM | POA: Diagnosis not present

## 2018-10-28 DIAGNOSIS — J449 Chronic obstructive pulmonary disease, unspecified: Secondary | ICD-10-CM | POA: Diagnosis not present

## 2018-10-28 DIAGNOSIS — I10 Essential (primary) hypertension: Secondary | ICD-10-CM

## 2018-10-28 DIAGNOSIS — R0602 Shortness of breath: Secondary | ICD-10-CM | POA: Diagnosis not present

## 2018-10-28 MED ORDER — SYMBICORT 160-4.5 MCG/ACT IN AERO: 1.0000 | INHALATION_SPRAY | Freq: Two times a day (BID) | RESPIRATORY_TRACT | 2 refills | Status: DC

## 2018-10-28 NOTE — Progress Notes (Signed)
[facility] [address] [place], [place] [address]  Pulmonary Sleep Medicine   Office Visit Note  Patient Name: Darin Waters DOB: 01/29/1961 MRN [id number]  Date of Service: 10/28/2018  Complaints/HPI: Pt is here for 6 month follow up. Overall he is doing well.  He has quit smoking traditional cigarettes however, he is vaping. He reports a intermittent cough, which he says is not new for him. He Denies Chest pain, Shortness of breath, palpitations, headache, hemoptysis, or blurred vision.       ROS  General: (-) fever, (-) chills, (-) night sweats, (-) weakness Skin: (-) rashes, (-) itching,. Eyes: (-) visual changes, (-) redness, (-) itching. Nose and Sinuses: (-) nasal stuffiness or itchiness, (-) postnasal drip, (-) nosebleeds, (-) sinus trouble. Mouth and Throat: (-) sore throat, (-) hoarseness. Neck: (-) swollen glands, (-) enlarged thyroid, (-) neck pain. Respiratory: +cough, (-) bloody sputum, - shortness of breath, - wheezing. Cardiovascular: - ankle swelling, (-) chest pain. Lymphatic: (-) lymph node enlargement. Neurologic: (-) numbness, (-) tingling. Psychiatric: (-) anxiety, (-) depression   Current Medication: Outpatient Encounter Medications as of 10/28/2018  Medication Sig  . amLODipine-benazepril (LOTREL) 5-20 MG capsule Take 1 capsule by mouth daily before breakfast.   . aspirin 81 MG tablet Take 81 mg by mouth daily.  . citalopram (CELEXA) 10 MG tablet Take 10 mg by mouth daily before breakfast.   . cyclobenzaprine (FLEXERIL) 10 MG tablet Take 1 tablet (10 mg total) by mouth 3 (three) times daily as needed.  . docusate sodium (COLACE) 100 MG capsule Take 1 capsule (100 mg total) by mouth 2 (two) times daily. (Patient taking differently: Take 100 mg by mouth 2 (two) times daily as needed for mild constipation. )  . esomeprazole (NEXIUM) 20 MG capsule Take 20 mg by mouth daily before breakfast.  . furosemide (LASIX) 20 MG tablet Take 20 mg by  mouth daily.  Marland Kitchen. gabapentin (NEURONTIN) 300 MG capsule Take 1 capsule by mouth 2 (two) times daily.  Marland Kitchen. glimepiride (AMARYL) 2 MG tablet Take 2 mg by mouth daily with breakfast.  . Insulin Glargine (LANTUS SOLOSTAR) 100 UNIT/ML Solostar Pen Inject 34 Units into the skin at bedtime.   . metFORMIN (GLUCOPHAGE) 500 MG tablet Take 1,000 mg by mouth 2 (two) times daily with a meal.   . Multiple Vitamins-Minerals (MULTIVITAMIN WITH MINERALS) tablet Take 1 tablet by mouth daily.  Marland Kitchen. oxyCODONE-acetaminophen (PERCOCET) 10-325 MG tablet Take 1 tablet by mouth every 4 (four) hours as needed for pain.  Marland Kitchen. PROAIR HFA 108 (90 Base) MCG/ACT inhaler Take 2 puffs by mouth every 6 (six) hours as needed for wheezing or shortness of breath.   . quiNINE (QUALAQUIN) 324 MG capsule Take 1 capsule by mouth every other day.   . simvastatin (ZOCOR) 20 MG tablet Take 20 mg by mouth daily at 6 PM.   . SYMBICORT 160-4.5 MCG/ACT inhaler Take 1 puff by mouth 2 (two) times daily.   No facility-administered encounter medications on file as of 10/28/2018.     Surgical History: Past Surgical History:  Procedure Laterality Date  . ANTERIOR CERVICAL DECOMP/DISCECTOMY FUSION N/A 07/18/2015   Procedure: CERVICALFIVE-SIX, CERVICAL SIX-SEVEN ANTERIOR CERVICAL DECOMPRESSION/DISCECTOMY FUSION;  Surgeon: Tressie StalkerJeffrey Jenkins, MD;  Location: [facility];  Service: Neurosurgery;  Laterality: N/A;  C56 C67 anterior cervical decompression with fusion interbody prosthesis plating and bonegraft  . COLONOSCOPY    . RECTAL POLYPECTOMY  4-5 yrs ago    Medical History: Past Medical History:  Diagnosis Date  . Arthritis    DDD- lumbar , thumbs - both hands   . COPD (chronic obstructive pulmonary disease) (HCC)   . Depression   . Diabetes mellitus without complication (HCC)   . GERD (gastroesophageal reflux disease)   . History of kidney stones 1990's    lithotripsy done   . Hypertension   . Sleep apnea 01/30/2016   [facility]  [place], results not avail to pt. yet    Family History: Family History  Problem Relation Age of Onset  . Diabetes Mother   . Asthma Mother   . Hypertension Mother   . Hyperlipidemia Mother   . Hyperlipidemia Father   . Dementia Father   . Hypertension Father   . Heart Problems Father        pace maker    Social History: Social History   Socioeconomic History  . Marital status: Married    Spouse name: Not on file  . Number of children: Not on file  . Years of education: Not on file  . Highest education level: Not on file  Occupational History  . Not on file  Social Needs  . Financial resource strain: Not on file  . Food insecurity    Worry: Not on file    Inability: Not on file  . Transportation needs    Medical: Not on file    Non-medical: Not on file  Tobacco Use  . Smoking status: Former Smoker    Packs/day: 2.50    Years: 45.00    Pack years: 112.50    Quit date: 05/2016    Years since quitting: 2.4  . Smokeless tobacco: Never Used  Substance and Sexual Activity  . Alcohol use: No  . Drug use: No  . Sexual activity: Not on file  Lifestyle  . Physical activity    Days per week: Not on file    Minutes per session: Not on file  . Stress: Not on file  Relationships  . Social Musicianconnections    Talks on phone: Not on file    Gets together: Not on file    Attends religious service: Not on file    Active member of club or organization: Not on file    Attends meetings of clubs or organizations: Not on file    Relationship status: Not on file  . Intimate partner violence    Fear of current or ex partner: Not on file    Emotionally abused: Not on file    Physically abused: Not on file    Forced sexual activity: Not on file  Other Topics Concern  . Not on file  Social History Narrative  . Not on file    Vital Signs: Blood pressure 137/68, pulse 61, resp. rate 16, height 5\' 6"  (1.676 m), weight 224 lb (101.6 kg), SpO2 96 %.  Examination: General  Appearance: The patient is well-developed, well-nourished, and in no distress. Skin: Gross inspection of skin unremarkable. Head: normocephalic, no gross deformities. Eyes: no gross deformities noted. ENT: ears appear grossly normal no exudates. Neck: Supple. No thyromegaly. No LAD. Respiratory: clear bilateraly. Cardiovascular: Normal S1 and S2 without murmur or rub. Extremities: No cyanosis. pulses are equal. Neurologic: Alert and oriented. No involuntary movements.  LABS: Recent Results (from the past 2160 hour(s))  I-STAT creatinine     Status: None   Collection Time: 09/13/18  1:05 PM  Result Value Ref Range   Creatinine, Ser 0.90 0.61 - 1.24 mg/dL  Radiology: Mr Lumbar Spine W Wo Contrast  Result Date: 09/13/2018 CLINICAL DATA:  Lumbar radiculopathy. Previous interbody and posterior fusion at L4-5. EXAM: MRI LUMBAR SPINE WITHOUT AND WITH CONTRAST TECHNIQUE: Multiplanar and multiecho pulse sequences of the lumbar spine were obtained without and with intravenous contrast. CONTRAST:  10 cc Gadavist COMPARISON:  Radiographs dated 07/26/2018 and lumbar MRI dated 08/10/2015 FINDINGS: Segmentation:  Standard. Alignment:  Physiologic. Vertebrae: Posterior and interbody fusion at L4-5. Old anterior wedge deformity of L1, unchanged. Conus medullaris and cauda equina: Conus extends to the L1-2 level. Conus and cauda equina appear normal. Paraspinal and other soft tissues: Negative. Disc levels: L1-2: Tiny central disc bulge with no neural impingement. L2-3: Normal. L3-4: Normal. L4-5: Interbody and posterior fusion. Slight enhancing scarring around the thecal sac to the expected degree. No neural impingement. Widely patent neural foramina. L5-S1: Normal. IMPRESSION: 1. Slight scarring around the thecal sac at L4-5 to the expected degree. No new or recurrent disc protrusion. 2. Tiny central disc bulge at L1-2 without neural impingement, unchanged. 3. No other significant abnormalities.  Electronically Signed   By: Francene BoyersJames  Maxwell M.D.   On: 09/13/2018 16:35    No results found.  No results found.    Assessment and Plan: Patient Active Problem List   Diagnosis Date Noted  . Nicotine dependence with current use 06/23/2017  . Morbid obesity (HCC) 06/23/2017  . Coronary artery disease due to lipid rich plaque 05/14/2017  . Chronic, continuous use of opioids 05/14/2017  . Fatty liver disease, nonalcoholic 05/14/2017  . Obesity (BMI 30-39.9) 05/14/2017  . Spondylolisthesis of lumbar region 02/13/2016  . Cervical herniated disc 07/18/2015  . SOB (shortness of breath) on exertion 07/13/2014  . DDD (degenerative disc disease), lumbar 02/28/2014  . Lumbar radiculitis 01/25/2014  . Obstructive chronic bronchitis without exacerbation (HCC) 06/14/2013  . Chronic pain disorder 06/14/2013  . Depression (emotion) 06/14/2013  . Hyperlipidemia, unspecified 06/14/2013  . Hypertension 06/14/2013  . Microalbuminuric diabetic nephropathy (HCC) 06/14/2013  . Tobacco abuse 06/14/2013  . Diabetes type 2, uncontrolled (HCC) 03/31/2006   1. Obstructive chronic bronchitis without exacerbation (HCC) Use Symbicort 1 puff twice a day faithfully, and follow up in 4 weeks. Get CXR due to history of smoking, and increased sob.  - DG Chest 2 View; Future - SYMBICORT 160-4.5 MCG/ACT inhaler; Inhale 1 puff into the lungs 2 (two) times daily.  Dispense: 1 Inhaler; Refill: 2  2. Nicotine dependence in remission, unspecified nicotine product type PT has not had chest x-ray in over a year, ordered at this visit. Smoking cessation counseling: 1. Pt acknowledges the risks of long term smoking, she will try to quite smoking. 2. Options for different medications including nicotine products, chewing gum, patch etc, Wellbutrin and Chantix is discussed 3. Goal and date of compete cessation is discussed 4. Total time spent in smoking cessation is 15 min.  3. Hypertension, unspecified type Stable,  continue present management.  4. Morbid obesity (HCC) Obesity Counseling: Risk Assessment: An assessment of behavioral risk factors was made today and includes lack of exercise sedentary lifestyle, lack of portion control and poor dietary habits.  Risk Modification Advice: She was counseled on portion control guidelines. Restricting daily caloric intake to. . The detrimental long term effects of obesity on her health and ongoing poor compliance was also discussed with the patient.   5. SOB (shortness of breath) FVC 3.0 which is 73% predicted value FEV1 is 2.4 which is 76% of the pre-predicted value FEV1/FVC is 79% which  is 105% of pre-predicted value on today's spirometry. - Spirometry with Graph   General Counseling: I have discussed the findings of the evaluation and examination with Savir.  I have also discussed any further diagnostic evaluation thatmay be needed or ordered today. Wilferd verbalizes understanding of the findings of todays visit. We also reviewed his medications today and discussed drug interactions and side effects including but not limited excessive drowsiness and altered mental states. We also discussed that there is always a risk not just to him but also people around him. he has been encouraged to call the office with any questions or concerns that should arise related to todays visit.    Time spent: 20 This patient was seen by Orson Gear AGNP-C in Collaboration with Dr. Devona Konig as a part of collaborative care agreement.   I have personally obtained a history, examined the patient, evaluated laboratory and imaging results, formulated the assessment and plan and placed orders.    Allyne Gee, MD [facility] Pulmonary and Critical Care Sleep medicine

## 2018-10-29 ENCOUNTER — Ambulatory Visit: Payer: Self-pay | Admitting: Adult Health

## 2018-11-04 ENCOUNTER — Ambulatory Visit
Admission: RE | Admit: 2018-11-04 | Discharge: 2018-11-04 | Disposition: A | Discharge status: Home / Self Care | Payer: Medicare HMO | Source: Ambulatory Visit | Attending: Adult Health | Admitting: Adult Health

## 2018-11-04 ENCOUNTER — Other Ambulatory Visit: Payer: Self-pay

## 2018-11-04 DIAGNOSIS — J449 Chronic obstructive pulmonary disease, unspecified: Secondary | ICD-10-CM | POA: Insufficient documentation

## 2018-11-25 ENCOUNTER — Encounter: Payer: Self-pay | Admitting: Internal Medicine

## 2018-11-25 ENCOUNTER — Ambulatory Visit: Payer: Medicare HMO | Admitting: Internal Medicine

## 2018-11-25 VITALS — BP 138/62 | HR 59 | Resp 16 | Ht 68.0 in | Wt 224.0 lb

## 2018-11-25 DIAGNOSIS — F17201 Nicotine dependence, unspecified, in remission: Secondary | ICD-10-CM | POA: Diagnosis not present

## 2018-11-25 DIAGNOSIS — I10 Essential (primary) hypertension: Secondary | ICD-10-CM | POA: Diagnosis not present

## 2018-11-25 DIAGNOSIS — J449 Chronic obstructive pulmonary disease, unspecified: Secondary | ICD-10-CM | POA: Diagnosis not present

## 2018-11-25 NOTE — Progress Notes (Signed)
[facility] [address] [place], [place] [address]  Pulmonary Sleep Medicine   Office Visit Note  Patient Name: Darin ShadowDanny T Waters DOB: 09/03/1960 MRN [id number]  Date of Service: 11/25/2018  Complaints/HPI: Pt is here for follow up on cxr.  He has  Been using the Symbicort and is doing well at this time. He denies any issues currently. He has not smoke traditional cigarettes, and continues to vape.  Denies Chest pain, Shortness of breath, palpitations, headache, or blurred vision.   ROS  General: (-) fever, (-) chills, (-) night sweats, (-) weakness Skin: (-) rashes, (-) itching,. Eyes: (-) visual changes, (-) redness, (-) itching. Nose and Sinuses: (-) nasal stuffiness or itchiness, (-) postnasal drip, (-) nosebleeds, (-) sinus trouble. Mouth and Throat: (-) sore throat, (-) hoarseness. Neck: (-) swollen glands, (-) enlarged thyroid, (-) neck pain. Respiratory: - cough, (-) bloody sputum, - shortness of breath, - wheezing. Cardiovascular: - ankle swelling, (-) chest pain. Lymphatic: (-) lymph node enlargement. Neurologic: (-) numbness, (-) tingling. Psychiatric: (-) anxiety, (-) depression   Current Medication: Outpatient Encounter Medications as of 11/25/2018  Medication Sig  . amLODipine-benazepril (LOTREL) 5-20 MG capsule Take 1 capsule by mouth daily before breakfast.   . aspirin 81 MG tablet Take 81 mg by mouth daily.  . citalopram (CELEXA) 10 MG tablet Take 10 mg by mouth daily before breakfast.   . cyclobenzaprine (FLEXERIL) 10 MG tablet Take 1 tablet (10 mg total) by mouth 3 (three) times daily as needed.  . docusate sodium (COLACE) 100 MG capsule Take 1 capsule (100 mg total) by mouth 2 (two) times daily. (Patient taking differently: Take 100 mg by mouth 2 (two) times daily as needed for mild constipation. )  . esomeprazole (NEXIUM) 20 MG capsule Take 20 mg by mouth daily before breakfast.  . furosemide (LASIX) 20 MG tablet Take 20 mg by mouth daily.  Marland Kitchen.  gabapentin (NEURONTIN) 300 MG capsule Take 1 capsule by mouth 2 (two) times daily.  Marland Kitchen. glimepiride (AMARYL) 2 MG tablet Take 2 mg by mouth daily with breakfast.  . Insulin Glargine (LANTUS SOLOSTAR) 100 UNIT/ML Solostar Pen Inject 34 Units into the skin at bedtime.   . metFORMIN (GLUCOPHAGE) 500 MG tablet Take 1,000 mg by mouth 2 (two) times daily with a meal.   . Multiple Vitamins-Minerals (MULTIVITAMIN WITH MINERALS) tablet Take 1 tablet by mouth daily.  Marland Kitchen. oxyCODONE-acetaminophen (PERCOCET) 10-325 MG tablet Take 1 tablet by mouth every 4 (four) hours as needed for pain.  Marland Kitchen. PROAIR HFA 108 (90 Base) MCG/ACT inhaler Take 2 puffs by mouth every 6 (six) hours as needed for wheezing or shortness of breath.   . quiNINE (QUALAQUIN) 324 MG capsule Take 1 capsule by mouth every other day.   . simvastatin (ZOCOR) 20 MG tablet Take 20 mg by mouth daily at 6 PM.   . SYMBICORT 160-4.5 MCG/ACT inhaler Inhale 1 puff into the lungs 2 (two) times daily.   No facility-administered encounter medications on file as of 11/25/2018.     Surgical History: Past Surgical History:  Procedure Laterality Date  . ANTERIOR CERVICAL DECOMP/DISCECTOMY FUSION N/A 07/18/2015   Procedure: CERVICALFIVE-SIX, CERVICAL SIX-SEVEN ANTERIOR CERVICAL DECOMPRESSION/DISCECTOMY FUSION;  Surgeon: Tressie StalkerJeffrey Jenkins, MD;  Location: [facility];  Service: Neurosurgery;  Laterality: N/A;  C56 C67 anterior cervical decompression with fusion interbody prosthesis plating and bonegraft  . COLONOSCOPY    . RECTAL POLYPECTOMY  4-5 yrs ago    Medical History: Past Medical History:  Diagnosis  Date  . Arthritis    DDD- lumbar , thumbs - both hands   . COPD (chronic obstructive pulmonary disease) (HCC)   . Depression   . Diabetes mellitus without complication (HCC)   . GERD (gastroesophageal reflux disease)   . History of kidney stones 1990's    lithotripsy done   . Hypertension   . Sleep apnea 01/30/2016   [facility] [place], results  not avail to pt. yet    Family History: Family History  Problem Relation Age of Onset  . Diabetes Mother   . Asthma Mother   . Hypertension Mother   . Hyperlipidemia Mother   . Hyperlipidemia Father   . Dementia Father   . Hypertension Father   . Heart Problems Father        pace maker    Social History: Social History   Socioeconomic History  . Marital status: Married    Spouse name: Not on file  . Number of children: Not on file  . Years of education: Not on file  . Highest education level: Not on file  Occupational History  . Not on file  Social Needs  . Financial resource strain: Not on file  . Food insecurity    Worry: Not on file    Inability: Not on file  . Transportation needs    Medical: Not on file    Non-medical: Not on file  Tobacco Use  . Smoking status: Former Smoker    Packs/day: 2.50    Years: 45.00    Pack years: 112.50    Quit date: 05/2016    Years since quitting: 2.5  . Smokeless tobacco: Never Used  Substance and Sexual Activity  . Alcohol use: No  . Drug use: No  . Sexual activity: Not on file  Lifestyle  . Physical activity    Days per week: Not on file    Minutes per session: Not on file  . Stress: Not on file  Relationships  . Social Musician on phone: Not on file    Gets together: Not on file    Attends religious service: Not on file    Active member of club or organization: Not on file    Attends meetings of clubs or organizations: Not on file    Relationship status: Not on file  . Intimate partner violence    Fear of current or ex partner: Not on file    Emotionally abused: Not on file    Physically abused: Not on file    Forced sexual activity: Not on file  Other Topics Concern  . Not on file  Social History Narrative  . Not on file    Vital Signs: Blood pressure 138/62, pulse (!) 59, resp. rate 16, height 5\' 8"  (1.727 m), weight 224 lb (101.6 kg), SpO2 97 %.  Examination: General Appearance: The  patient is well-developed, well-nourished, and in no distress. Skin: Gross inspection of skin unremarkable. Head: normocephalic, no gross deformities. Eyes: no gross deformities noted. ENT: ears appear grossly normal no exudates. Neck: Supple. No thyromegaly. No LAD. Respiratory: clear bilaterally. Cardiovascular: Normal S1 and S2 without murmur or rub. Extremities: No cyanosis. pulses are equal. Neurologic: Alert and oriented. No involuntary movements.  LABS: Recent Results (from the past 2160 hour(s))  I-STAT creatinine     Status: None   Collection Time: 09/13/18  1:05 PM  Result Value Ref Range   Creatinine, Ser 0.90 0.61 - 1.24 mg/dL  Radiology: Dg Chest 2 View  Result Date: 11/04/2018 CLINICAL DATA:  Cough, smoker, now vapes, shortness of breath at times, COPD, hypertension, diabetes mellitus EXAM: CHEST - 2 VIEW COMPARISON:  05/21/2017 FINDINGS: Upper normal heart size. Mediastinal contours and pulmonary vascularity normal. Atherosclerotic calcification aorta. Mild central bronchitic changes. Lungs otherwise clear. No pulmonary infiltrate, pleural effusion or pneumothorax. Bones demineralized with evidence of prior cervicothoracic fusion. IMPRESSION: Bronchitic changes without infiltrate. Electronically Signed   By: Lavonia Dana M.D.   On: 11/04/2018 16:42    No results found.  Dg Chest 2 View  Result Date: 11/04/2018 CLINICAL DATA:  Cough, smoker, now vapes, shortness of breath at times, COPD, hypertension, diabetes mellitus EXAM: CHEST - 2 VIEW COMPARISON:  05/21/2017 FINDINGS: Upper normal heart size. Mediastinal contours and pulmonary vascularity normal. Atherosclerotic calcification aorta. Mild central bronchitic changes. Lungs otherwise clear. No pulmonary infiltrate, pleural effusion or pneumothorax. Bones demineralized with evidence of prior cervicothoracic fusion. IMPRESSION: Bronchitic changes without infiltrate. Electronically Signed   By: Lavonia Dana M.D.   On:  11/04/2018 16:42      Assessment and Plan: Patient Active Problem List   Diagnosis Date Noted  . Nicotine dependence with current use 06/23/2017  . Morbid obesity ([place]) 06/23/2017  . Coronary artery disease due to lipid rich plaque 05/14/2017  . Chronic, continuous use of opioids 05/14/2017  . Fatty liver disease, nonalcoholic 13/10/6576  . Obesity (BMI 30-39.9) 05/14/2017  . Spondylolisthesis of lumbar region 02/13/2016  . Cervical herniated disc 07/18/2015  . SOB (shortness of breath) on exertion 07/13/2014  . DDD (degenerative disc disease), lumbar 02/28/2014  . Lumbar radiculitis 01/25/2014  . Obstructive chronic bronchitis without exacerbation ([place]) 06/14/2013  . Chronic pain disorder 06/14/2013  . Depression (emotion) 06/14/2013  . Hyperlipidemia, unspecified 06/14/2013  . Hypertension 06/14/2013  . Microalbuminuric diabetic nephropathy ([place]) 06/14/2013  . Tobacco abuse 06/14/2013  . Diabetes type 2, uncontrolled ([place]) 03/31/2006    1. Obstructive chronic bronchitis without exacerbation ([place]) Well-controlled on Trelegy inhaler.  Continue present management and follow-up as scheduled.  2. Hypertension, unspecified type Well-controlled at this time, continue present management.  3. Nicotine dependence in remission, unspecified nicotine product type Smoking cessation counseling: 1. Pt acknowledges the risks of long term smoking, she will try to quite smoking. 2. Options for different medications including nicotine products, chewing gum, patch etc, Wellbutrin and Chantix is discussed 3. Goal and date of compete cessation is discussed 4. Total time spent in smoking cessation is 15 min.   General Counseling: I have discussed the findings of the evaluation and examination with Amauri.  I have also discussed any further diagnostic evaluation thatmay be needed or ordered today. Asier verbalizes understanding of the findings of todays visit. We also reviewed his medications  today and discussed drug interactions and side effects including but not limited excessive drowsiness and altered mental states. We also discussed that there is always a risk not just to him but also people around him. he has been encouraged to call the office with any questions or concerns that should arise related to todays visit.    Time spent: 15 This patient was seen by Orson Gear AGNP-C in Collaboration with Dr. Devona Konig as a part of collaborative care agreement.   I have personally obtained a history, examined the patient, evaluated laboratory and imaging results, formulated the assessment and plan and placed orders.    Allyne Gee, MD [facility] Pulmonary and Critical Care Sleep medicine

## 2018-12-10 DIAGNOSIS — M4316 Spondylolisthesis, lumbar region: Secondary | ICD-10-CM | POA: Insufficient documentation

## 2019-05-25 ENCOUNTER — Encounter: Payer: Medicare HMO | Attending: Nurse Practitioner | Admitting: Skilled Nursing Facility1

## 2019-05-25 ENCOUNTER — Other Ambulatory Visit: Payer: Self-pay

## 2019-05-25 ENCOUNTER — Encounter: Payer: Self-pay | Admitting: Skilled Nursing Facility1

## 2019-05-25 DIAGNOSIS — Z87891 Personal history of nicotine dependence: Secondary | ICD-10-CM | POA: Diagnosis not present

## 2019-05-25 DIAGNOSIS — I10 Essential (primary) hypertension: Secondary | ICD-10-CM | POA: Diagnosis not present

## 2019-05-25 DIAGNOSIS — E785 Hyperlipidemia, unspecified: Secondary | ICD-10-CM | POA: Insufficient documentation

## 2019-05-25 DIAGNOSIS — E118 Type 2 diabetes mellitus with unspecified complications: Secondary | ICD-10-CM | POA: Insufficient documentation

## 2019-05-25 NOTE — Progress Notes (Signed)
Pt states he gets a shot in his back every 3 months. Pt states he quit smoking a few years ago and feels much better since quitting. Pt states he misses the past and the good old days. Pt states he cannot walk far before his hip starts to ache and breathing hard. Pt states when he got disabled he gained weight from not working and taking care of his father. Pt states he is just not interested in moving. Pt states he checks his blood sugar and blood pressure each morning. Pt states he knows biscuits and breads run his suagr up. Pt states his wife makes all of her cakes andpies with sugar alternatives. Pt states he tries to keep his sugars 150-190. Pt states he knows when someone has diabetes from their parents you cannot ever get off medication: Dietitian attempted to educate pt on this topic but pt denied those facts. Pt states he is content with his life and does not want to make any changes and just wants to keep it "slow". Pt states all these young people just do not get it.  Pt states it was nice of his doctor to send him here ([facility]) but he is not interested in making any changes nor does he need any education. Dietitian attempted to offer pieces of education but pt was not interested and quickly diverted the conversation away to his current lifestyle being exactly what he wants.  Dietitian offered books of information on type 2 diabetes  which pt accepted.

## 2019-06-03 ENCOUNTER — Telehealth: Payer: Self-pay

## 2019-06-03 NOTE — Telephone Encounter (Signed)
Patient rescheduled appointment on 06/07/2019 to 07/11/2019. klh

## 2019-06-07 ENCOUNTER — Ambulatory Visit: Payer: Medicare HMO | Admitting: Internal Medicine

## 2019-07-07 ENCOUNTER — Telehealth: Payer: Self-pay

## 2019-07-07 NOTE — Telephone Encounter (Signed)
Confirmed appointment on 07/11/2019 and screened for covid. klh 

## 2019-07-11 ENCOUNTER — Encounter: Payer: Self-pay | Admitting: Internal Medicine

## 2019-07-11 ENCOUNTER — Other Ambulatory Visit: Payer: Self-pay

## 2019-07-11 ENCOUNTER — Ambulatory Visit: Payer: Medicare HMO | Admitting: Internal Medicine

## 2019-07-11 VITALS — BP 127/60 | HR 58 | Temp 97.5°F | Resp 16 | Ht 66.0 in | Wt 242.0 lb

## 2019-07-11 DIAGNOSIS — I10 Essential (primary) hypertension: Secondary | ICD-10-CM

## 2019-07-11 DIAGNOSIS — F17201 Nicotine dependence, unspecified, in remission: Secondary | ICD-10-CM | POA: Diagnosis not present

## 2019-07-11 DIAGNOSIS — J4489 Other specified chronic obstructive pulmonary disease: Secondary | ICD-10-CM

## 2019-07-11 DIAGNOSIS — R0602 Shortness of breath: Secondary | ICD-10-CM | POA: Diagnosis not present

## 2019-07-11 DIAGNOSIS — J449 Chronic obstructive pulmonary disease, unspecified: Secondary | ICD-10-CM | POA: Diagnosis not present

## 2019-07-11 MED ORDER — TRELEGY ELLIPTA 100-62.5-25 MCG/INH IN AEPB: 1.0000 | INHALATION_SPRAY | Freq: Every day | RESPIRATORY_TRACT | 2 refills | Status: DC

## 2019-07-11 NOTE — Progress Notes (Signed)
[facility] [place], [place] [address]  Pulmonary Sleep Medicine   Office Visit Note  Patient Name: Darin Waters DOB: [birth year] MRN [id number]  Date of Service: 07/11/2019  Complaints/HPI: Pt is here for pulmonary follow up.  He has a history of copd. He is currently taking Symbicort and albuterol inhalers.  He is complaining because the inhalers are expensive.    ROS  General: (-) fever, (-) chills, (-) night sweats, (-) weakness Skin: (-) rashes, (-) itching,. Eyes: (-) visual changes, (-) redness, (-) itching. Nose and Sinuses: (-) nasal stuffiness or itchiness, (-) postnasal drip, (-) nosebleeds, (-) sinus trouble. Mouth and Throat: (-) sore throat, (-) hoarseness. Neck: (-) swollen glands, (-) enlarged thyroid, (-) neck pain. Respiratory: - cough, (-) bloody sputum, - shortness of breath, - wheezing. Cardiovascular: - ankle swelling, (-) chest pain. Lymphatic: (-) lymph node enlargement. Neurologic: (-) numbness, (-) tingling. Psychiatric: (-) anxiety, (-) depression   Current Medication: Outpatient Encounter Medications as of 07/11/2019  Medication Sig  . amLODipine-benazepril (LOTREL) 5-20 MG capsule Take 1 capsule by mouth daily before breakfast.   . citalopram (CELEXA) 10 MG tablet Take 10 mg by mouth daily before breakfast.   . cyclobenzaprine (FLEXERIL) 10 MG tablet Take 1 tablet (10 mg total) by mouth 3 (three) times daily as needed.  Marland Kitchen esomeprazole (NEXIUM) 20 MG capsule Take 20 mg by mouth daily before breakfast.  . furosemide (LASIX) 20 MG tablet Take 20 mg by mouth daily.  Marland Kitchen gabapentin (NEURONTIN) 300 MG capsule Take 1 capsule by mouth 2 (two) times daily.  . Insulin Glargine (LANTUS SOLOSTAR) 100 UNIT/ML Solostar Pen Inject 34 Units into the skin at bedtime.   . metFORMIN (GLUCOPHAGE) 500 MG tablet Take 1,000 mg by mouth 2 (two) times daily with a meal.   . Multiple Vitamins-Minerals (MULTIVITAMIN WITH MINERALS) tablet Take 1  tablet by mouth daily.  Marland Kitchen oxyCODONE-acetaminophen (PERCOCET) 10-325 MG tablet Take 1 tablet by mouth every 4 (four) hours as needed for pain.  Marland Kitchen PROAIR HFA 108 (90 Base) MCG/ACT inhaler Take 2 puffs by mouth every 6 (six) hours as needed for wheezing or shortness of breath.   . quiNINE (QUALAQUIN) 324 MG capsule Take 1 capsule by mouth every other day.   . SYMBICORT 160-4.5 MCG/ACT inhaler Inhale 1 puff into the lungs 2 (two) times daily.  Marland Kitchen aspirin 81 MG tablet Take 81 mg by mouth daily.  Marland Kitchen docusate sodium (COLACE) 100 MG capsule Take 1 capsule (100 mg total) by mouth 2 (two) times daily. (Patient taking differently: Take 100 mg by mouth 2 (two) times daily as needed for mild constipation. )  . glimepiride (AMARYL) 2 MG tablet Take 2 mg by mouth daily with breakfast.  . simvastatin (ZOCOR) 20 MG tablet Take 20 mg by mouth daily at 6 PM.    No facility-administered encounter medications on file as of 07/11/2019.    Surgical History: Past Surgical History:  Procedure Laterality Date  . ANTERIOR CERVICAL DECOMP/DISCECTOMY FUSION N/A 07/18/2015   Procedure: CERVICALFIVE-SIX, CERVICAL SIX-SEVEN ANTERIOR CERVICAL DECOMPRESSION/DISCECTOMY FUSION;  Surgeon: Newman Pies, MD;  Location: [place] [facility];  Service: Neurosurgery;  Laterality: N/A;  C56 C67 anterior cervical decompression with fusion interbody prosthesis plating and bonegraft  . COLONOSCOPY    . RECTAL POLYPECTOMY  4-5 yrs ago    Medical History: Past Medical History:  Diagnosis Date  . Arthritis    DDD- lumbar , thumbs - both hands   . COPD (chronic  obstructive pulmonary disease) (HCC)   . Depression   . Diabetes mellitus without complication (HCC)   . GERD (gastroesophageal reflux disease)   . History of kidney stones 1990's    lithotripsy done   . Hypertension   . Sleep apnea 01/30/2016   [facility] [place], results not avail to pt. yet    Family History: Family History  Problem Relation Age of Onset  .  Diabetes Mother   . Asthma Mother   . Hypertension Mother   . Hyperlipidemia Mother   . Hyperlipidemia Father   . Dementia Father   . Hypertension Father   . Heart Problems Father        pace maker    Social History: Social History   Socioeconomic History  . Marital status: Married    Spouse name: Not on file  . Number of children: Not on file  . Years of education: Not on file  . Highest education level: Not on file  Occupational History  . Not on file  Tobacco Use  . Smoking status: Former Smoker    Packs/day: 2.50    Years: 45.00    Pack years: 112.50    Quit date: 05/2016    Years since quitting: 3.1  . Smokeless tobacco: Never Used  Substance and Sexual Activity  . Alcohol use: No  . Drug use: No  . Sexual activity: Not on file  Other Topics Concern  . Not on file  Social History Narrative  . Not on file   Social Determinants of Health   Financial Resource Strain:   . Difficulty of Paying Living Expenses:   Food Insecurity:   . Worried About Programme researcher, broadcasting/film/video in the Last Year:   . Barista in the Last Year:   Transportation Needs:   . Freight forwarder (Medical):   Marland Kitchen Lack of Transportation (Non-Medical):   Physical Activity:   . Days of Exercise per Week:   . Minutes of Exercise per Session:   Stress:   . Feeling of Stress :   Social Connections:   . Frequency of Communication with Friends and Family:   . Frequency of Social Gatherings with Friends and Family:   . Attends Religious Services:   . Active Member of Clubs or Organizations:   . Attends Banker Meetings:   Marland Kitchen Marital Status:   Intimate Partner Violence:   . Fear of Current or Ex-Partner:   . Emotionally Abused:   Marland Kitchen Physically Abused:   . Sexually Abused:     Vital Signs: Blood pressure 127/60, pulse (!) 58, temperature (!) 97.5 F (36.4 C), resp. rate 16, height 5\' 6"  (1.676 m), weight 242 lb (109.8 kg), SpO2 94 %.  Examination: General Appearance: The  patient is well-developed, well-nourished, and in no distress. Skin: Gross inspection of skin unremarkable. Head: normocephalic, no gross deformities. Eyes: no gross deformities noted. ENT: ears appear grossly normal no exudates. Neck: Supple. No thyromegaly. No LAD. Respiratory: clear bilaterally. Cardiovascular: Normal S1 and S2 without murmur or rub. Extremities: No cyanosis. pulses are equal. Neurologic: Alert and oriented. No involuntary movements.  LABS: No results found for this or any previous visit (from the past 2160 hour(s)).  Radiology: DG Chest 2 View  Result Date: 11/04/2018 CLINICAL DATA:  Cough, smoker, now vapes, shortness of breath at times, COPD, hypertension, diabetes mellitus EXAM: CHEST - 2 VIEW COMPARISON:  05/21/2017 FINDINGS: Upper normal heart size. Mediastinal contours and pulmonary vascularity  normal. Atherosclerotic calcification aorta. Mild central bronchitic changes. Lungs otherwise clear. No pulmonary infiltrate, pleural effusion or pneumothorax. Bones demineralized with evidence of prior cervicothoracic fusion. IMPRESSION: Bronchitic changes without infiltrate. Electronically Signed   By: Ulyses Southward M.D.   On: 11/04/2018 16:42    No results found.  No results found.    Assessment and Plan: Patient Active Problem List   Diagnosis Date Noted  . Nicotine dependence with current use 06/23/2017  . Morbid obesity (HCC) 06/23/2017  . Coronary artery disease due to lipid rich plaque 05/14/2017  . Chronic, continuous use of opioids 05/14/2017  . Fatty liver disease, nonalcoholic 05/14/2017  . Obesity (BMI 30-39.9) 05/14/2017  . Spondylolisthesis of lumbar region 02/13/2016  . Cervical herniated disc 07/18/2015  . SOB (shortness of breath) on exertion 07/13/2014  . DDD (degenerative disc disease), lumbar 02/28/2014  . Lumbar radiculitis 01/25/2014  . Obstructive chronic bronchitis without exacerbation (HCC) 06/14/2013  . Chronic pain disorder 06/14/2013   . Depression (emotion) 06/14/2013  . Hyperlipidemia, unspecified 06/14/2013  . Hypertension 06/14/2013  . Microalbuminuric diabetic nephropathy (HCC) 06/14/2013  . Tobacco abuse 06/14/2013  . Diabetes type 2, uncontrolled (HCC) 03/31/2006    1. Obstructive chronic bronchitis without exacerbation (HCC) Samples of Trelegy and Breztri given to try.  RX sent for trelegy.  If patient calls for Symbicort inhaler due to cost of trelegy we will refill.  - Fluticasone-Umeclidin-Vilant (TRELEGY ELLIPTA) 100-62.5-25 MCG/INH AEPB; Inhale 1 puff into the lungs daily.  Dispense: 60 each; Refill: 2  2. Nicotine dependence in remission, unspecified nicotine product type Pt is not smoking traditional cigarettes, he is vaping and has been for 2 years. Smoking cessation counseling: 1. Pt acknowledges the risks of long term smoking, she will try to quite smoking. 2. Options for different medications including nicotine products, chewing gum, patch etc, Wellbutrin and Chantix is discussed 3. Goal and date of compete cessation is discussed 4. Total time spent in smoking cessation is 15 min.  3. Hypertension, unspecified type Stable, continue present management.   4. SOB (shortness of breath) Excellent spiro today. FEV1 3.0, which is 95% of pre-predicted value.   - Spirometry with Graph  General Counseling: I have discussed the findings of the evaluation and examination with Lamar.  I have also discussed any further diagnostic evaluation thatmay be needed or ordered today. Jerry verbalizes understanding of the findings of todays visit. We also reviewed his medications today and discussed drug interactions and side effects including but not limited excessive drowsiness and altered mental states. We also discussed that there is always a risk not just to him but also people around him. he has been encouraged to call the office with any questions or concerns that should arise related to todays visit.  Orders  Placed This Encounter  Procedures  . Spirometry with Graph    Order Specific Question:   Where should this test be performed?    Answer:   Other     Time spent: 30 minutes.  This patient was seen by Blima Ledger AGNP-C in Collaboration with Dr. Freda Munro as a part of collaborative care agreement.  I have personally obtained a history, examined the patient, evaluated laboratory and imaging results, formulated the assessment and plan and placed orders.    Yevonne Pax, MD [facility] Pulmonary and Critical Care Sleep medicine

## 2019-07-29 ENCOUNTER — Other Ambulatory Visit
Admission: RE | Admit: 2019-07-29 | Discharge: 2019-07-29 | Disposition: A | Discharge status: Home / Self Care | Payer: Medicare HMO | Source: Ambulatory Visit | Attending: Internal Medicine | Admitting: Internal Medicine

## 2019-07-29 DIAGNOSIS — Z01812 Encounter for preprocedural laboratory examination: Secondary | ICD-10-CM | POA: Diagnosis present

## 2019-07-29 DIAGNOSIS — Z20822 Contact with and (suspected) exposure to covid-19: Secondary | ICD-10-CM | POA: Insufficient documentation

## 2019-07-29 LAB — SARS CORONAVIRUS 2 (TAT 6-24 HRS): SARS Coronavirus 2: NEGATIVE

## 2019-08-01 ENCOUNTER — Other Ambulatory Visit: Payer: Medicare HMO

## 2019-08-03 ENCOUNTER — Ambulatory Visit: Payer: Medicare HMO | Admitting: Anesthesiology

## 2019-08-03 ENCOUNTER — Ambulatory Visit
Admission: RE | Admit: 2019-08-03 | Discharge: 2019-08-03 | Disposition: A | Discharge status: Home / Self Care | Payer: Medicare HMO | Attending: Internal Medicine | Admitting: Internal Medicine

## 2019-08-03 ENCOUNTER — Other Ambulatory Visit: Payer: Self-pay

## 2019-08-03 ENCOUNTER — Encounter
Admission: RE | Disposition: A | Discharge status: Home / Self Care | Payer: Self-pay | Source: Home / Self Care | Attending: Internal Medicine

## 2019-08-03 ENCOUNTER — Encounter: Payer: Self-pay | Admitting: Internal Medicine

## 2019-08-03 DIAGNOSIS — M5136 Other intervertebral disc degeneration, lumbar region: Secondary | ICD-10-CM | POA: Diagnosis not present

## 2019-08-03 DIAGNOSIS — F329 Major depressive disorder, single episode, unspecified: Secondary | ICD-10-CM | POA: Diagnosis not present

## 2019-08-03 DIAGNOSIS — Z79899 Other long term (current) drug therapy: Secondary | ICD-10-CM | POA: Insufficient documentation

## 2019-08-03 DIAGNOSIS — G709 Myoneural disorder, unspecified: Secondary | ICD-10-CM | POA: Diagnosis not present

## 2019-08-03 DIAGNOSIS — I10 Essential (primary) hypertension: Secondary | ICD-10-CM | POA: Diagnosis not present

## 2019-08-03 DIAGNOSIS — K449 Diaphragmatic hernia without obstruction or gangrene: Secondary | ICD-10-CM | POA: Diagnosis not present

## 2019-08-03 DIAGNOSIS — Z794 Long term (current) use of insulin: Secondary | ICD-10-CM | POA: Insufficient documentation

## 2019-08-03 DIAGNOSIS — Z86010 Personal history of colonic polyps: Secondary | ICD-10-CM | POA: Insufficient documentation

## 2019-08-03 DIAGNOSIS — M19042 Primary osteoarthritis, left hand: Secondary | ICD-10-CM | POA: Diagnosis not present

## 2019-08-03 DIAGNOSIS — K621 Rectal polyp: Secondary | ICD-10-CM | POA: Insufficient documentation

## 2019-08-03 DIAGNOSIS — J449 Chronic obstructive pulmonary disease, unspecified: Secondary | ICD-10-CM | POA: Insufficient documentation

## 2019-08-03 DIAGNOSIS — E119 Type 2 diabetes mellitus without complications: Secondary | ICD-10-CM | POA: Diagnosis not present

## 2019-08-03 DIAGNOSIS — K219 Gastro-esophageal reflux disease without esophagitis: Secondary | ICD-10-CM | POA: Insufficient documentation

## 2019-08-03 DIAGNOSIS — Z87442 Personal history of urinary calculi: Secondary | ICD-10-CM | POA: Insufficient documentation

## 2019-08-03 DIAGNOSIS — M19041 Primary osteoarthritis, right hand: Secondary | ICD-10-CM | POA: Diagnosis not present

## 2019-08-03 DIAGNOSIS — Z87891 Personal history of nicotine dependence: Secondary | ICD-10-CM | POA: Insufficient documentation

## 2019-08-03 DIAGNOSIS — G4730 Sleep apnea, unspecified: Secondary | ICD-10-CM | POA: Diagnosis not present

## 2019-08-03 DIAGNOSIS — Z1211 Encounter for screening for malignant neoplasm of colon: Secondary | ICD-10-CM | POA: Insufficient documentation

## 2019-08-03 DIAGNOSIS — Z7951 Long term (current) use of inhaled steroids: Secondary | ICD-10-CM | POA: Diagnosis not present

## 2019-08-03 DIAGNOSIS — Z7982 Long term (current) use of aspirin: Secondary | ICD-10-CM | POA: Insufficient documentation

## 2019-08-03 DIAGNOSIS — I2510 Atherosclerotic heart disease of native coronary artery without angina pectoris: Secondary | ICD-10-CM | POA: Diagnosis not present

## 2019-08-03 DIAGNOSIS — K640 First degree hemorrhoids: Secondary | ICD-10-CM | POA: Diagnosis not present

## 2019-08-03 HISTORY — PX: ESOPHAGOGASTRODUODENOSCOPY (EGD) WITH PROPOFOL: SHX5813

## 2019-08-03 HISTORY — PX: COLONOSCOPY WITH PROPOFOL: SHX5780

## 2019-08-03 LAB — GLUCOSE, CAPILLARY: Glucose-Capillary: 163 mg/dL — ABNORMAL HIGH (ref 70–99)

## 2019-08-03 SURGERY — ESOPHAGOGASTRODUODENOSCOPY (EGD) WITH PROPOFOL
Anesthesia: General

## 2019-08-03 MED ORDER — PROPOFOL 500 MG/50ML IV EMUL
INTRAVENOUS | Status: AC
  Filled 2019-08-03: qty 50

## 2019-08-03 MED ORDER — PROPOFOL 10 MG/ML IV BOLUS
INTRAVENOUS | Status: AC
  Filled 2019-08-03: qty 20

## 2019-08-03 MED ORDER — PROPOFOL 500 MG/50ML IV EMUL
INTRAVENOUS | Status: DC | PRN
  Administered 2019-08-03: 75 ug/kg/min via INTRAVENOUS

## 2019-08-03 MED ORDER — LIDOCAINE HCL (CARDIAC) PF 100 MG/5ML IV SOSY
PREFILLED_SYRINGE | INTRAVENOUS | Status: DC | PRN
  Administered 2019-08-03: 25 mg via INTRAVENOUS

## 2019-08-03 MED ORDER — SODIUM CHLORIDE 0.9 % IV SOLN: INTRAVENOUS | Status: DC

## 2019-08-03 MED ORDER — LIDOCAINE HCL (PF) 2 % IJ SOLN
INTRAMUSCULAR | Status: AC
  Filled 2019-08-03: qty 10

## 2019-08-03 MED ORDER — PROPOFOL 10 MG/ML IV BOLUS
INTRAVENOUS | Status: DC | PRN
Start: 2019-08-03 — End: 2019-08-03
  Administered 2019-08-03 (×3): 20 mg via INTRAVENOUS

## 2019-08-03 MED ORDER — GLYCOPYRROLATE 0.2 MG/ML IJ SOLN
INTRAMUSCULAR | Status: DC | PRN
  Administered 2019-08-03: .1 mg via INTRAVENOUS

## 2019-08-03 NOTE — Op Note (Signed)
[facility] Gastroenterology Patient Name: Darin Waters Procedure Date: 08/03/2019 8:14 AM MRN: [id number] Account #: [id number] Date of Birth: [birth year] Admit Type: Outpatient Age: 59 Room: [facility] ENDO ROOM 3 Gender: Male Note Status: Finalized Procedure:             Upper GI endoscopy Indications:           Gastro-esophageal reflux disease Providers:             [name]. Shakaya Bhullar MD, MD Medicines:             Propofol per Anesthesia Complications:         No immediate complications. Procedure:             Pre-Anesthesia Assessment:                        - The risks and benefits of the procedure and the                         sedation options and risks were discussed with the                         patient. All questions were answered and informed                         consent was obtained.                        - Patient identification and proposed procedure were                         verified prior to the procedure by the nurse.                        - ASA Grade Assessment: III - A patient with severe                         systemic disease.                        - After reviewing the risks and benefits, the patient                         was deemed in satisfactory condition to undergo the                         procedure.                        After obtaining informed consent, the endoscope was                         passed under direct vision. Throughout the procedure,                         the patient's blood pressure, pulse, and oxygen                         saturations were monitored continuously. The Endoscope  was introduced through the mouth, and advanced to the                         third part of duodenum. The upper GI endoscopy was                         accomplished without difficulty. The patient tolerated                         the procedure well. Findings:      The esophagus was normal.      A 1 cm  hiatal hernia was present.      The examined duodenum was normal. Impression:            - Normal esophagus.                        - 1 cm hiatal hernia.                        - Normal examined duodenum.                        - No specimens collected. Recommendation:        - Proceed with colonoscopy Procedure Code(s):     --- Professional ---                        [phone], Esophagogastroduodenoscopy, flexible,                         transoral; diagnostic, including collection of                         specimen(s) by brushing or washing, when performed                         (separate procedure) Diagnosis Code(s):     --- Professional ---                        K21.9, Gastro-esophageal reflux disease without                         esophagitis                        K44.9, Diaphragmatic hernia without obstruction or                         gangrene CPT copyright 2019 American Medical Association. All rights reserved. The codes documented in this report are preliminary and upon coder review may  be revised to meet current compliance requirements. Stanton Kidney MD, MD 08/03/2019 8:53:37 AM This report has been signed electronically. Number of Addenda: 0 Note Initiated On: 08/03/2019 8:14 AM Estimated Blood Loss:  Estimated blood loss: none.      [facility]

## 2019-08-03 NOTE — Op Note (Signed)
[facility] Gastroenterology Patient Name: Darin Waters Procedure Date: 08/03/2019 8:14 AM MRN: [id number] Account #: [id number] Date of Birth: 1961-01-23 Admit Type: Outpatient Age: 59 Room: [facility] ENDO ROOM 3 Gender: Male Note Status: Finalized Procedure:             Colonoscopy Indications:           Surveillance: Personal history of adenomatous polyps                         on last colonoscopy > 5 years ago Providers:             Royce Macadamia K. Mkayla Steele MD, MD Medicines:             Propofol per Anesthesia Complications:         No immediate complications. Procedure:             Pre-Anesthesia Assessment:                        - The risks and benefits of the procedure and the                         sedation options and risks were discussed with the                         patient. All questions were answered and informed                         consent was obtained.                        - Patient identification and proposed procedure were                         verified prior to the procedure by the nurse. The                         procedure was verified in the procedure room.                        - ASA Grade Assessment: III - A patient with severe                         systemic disease.                        - After reviewing the risks and benefits, the patient                         was deemed in satisfactory condition to undergo the                         procedure.                        After obtaining informed consent, the colonoscope was                         passed under direct vision. Throughout the procedure,  the patient's blood pressure, pulse, and oxygen                         saturations were monitored continuously. The                         Colonoscope was introduced through the anus and                         advanced to the the cecum, identified by appendiceal                         orifice and ileocecal  valve. The colonoscopy was                         performed without difficulty. The patient tolerated                         the procedure well. The quality of the bowel                         preparation was good. The ileocecal valve, appendiceal                         orifice, and rectum were photographed. Findings:      The perianal and digital rectal examinations were normal. Pertinent       negatives include normal sphincter tone and no palpable rectal lesions.      Non-bleeding internal hemorrhoids were found during retroflexion. The       hemorrhoids were Grade I (internal hemorrhoids that do not prolapse).      Two semi-pedunculated polyps were found in the distal rectum. The polyps       were 4 to 9 mm in size. These polyps were removed with a hot snare.       Resection and retrieval were complete.      The exam was otherwise without abnormality. Impression:            - Non-bleeding internal hemorrhoids.                        - Two 4 to 9 mm polyps in the distal rectum, removed                         with a hot snare. Resected and retrieved.                        - The examination was otherwise normal. Recommendation:        - Patient has a contact number available for                         emergencies. The signs and symptoms of potential                         delayed complications were discussed with the patient.                         Return to normal activities tomorrow. Written  discharge instructions were provided to the patient.                        - Resume previous diet.                        - Continue present medications.                        - Await pathology results.                        - Repeat colonoscopy is recommended for surveillance.                         The colonoscopy date will be determined after                         pathology results from today's exam become available                         for review.                         - Return to GI office in 1 year.                        - The findings and recommendations were discussed with                         the patient. Procedure Code(s):     --- Professional ---                        [phone], Colonoscopy, flexible; with removal of                         tumor(s), polyp(s), or other lesion(s) by snare                         technique Diagnosis Code(s):     --- Professional ---                        K64.0, First degree hemorrhoids                        K62.1, Rectal polyp                        Z86.010, Personal history of colonic polyps CPT copyright 2019 American Medical Association. All rights reserved. The codes documented in this report are preliminary and upon coder review may  be revised to meet current compliance requirements. Efrain Sella MD, MD 08/03/2019 9:11:52 AM This report has been signed electronically. Number of Addenda: 0 Note Initiated On: 08/03/2019 8:14 AM Scope Withdrawal Time: 0 hours 8 minutes 7 seconds  Total Procedure Duration: 0 hours 12 minutes 2 seconds  Estimated Blood Loss:  Estimated blood loss: none.      [facility]

## 2019-08-03 NOTE — Transfer of Care (Signed)
Immediate Anesthesia Transfer of Care Note  Patient: Darin Waters  Procedure(s) Performed: ESOPHAGOGASTRODUODENOSCOPY (EGD) WITH PROPOFOL (N/A ) COLONOSCOPY WITH PROPOFOL (N/A )  Patient Location: PACU  Anesthesia Type:MAC  Level of Consciousness: awake  Airway & Oxygen Therapy: Patient Spontanous Breathing and Patient connected to nasal cannula oxygen  Post-op Assessment: Report given to RN  Post vital signs: stable  Last Vitals:  Vitals Value Taken Time  BP 100/61 08/03/19 0912  Temp 36.7 C 08/03/19 0911  Pulse 57 08/03/19 0913  Resp 13 08/03/19 0913  SpO2 95 % 08/03/19 0913  Vitals shown include unvalidated device data.  Last Pain:  Vitals:   08/03/19 0911  TempSrc: Temporal  PainSc: 0-No pain         Complications: No apparent anesthesia complications

## 2019-08-03 NOTE — H&P (Signed)
Outpatient short stay form Pre-procedure 08/03/2019 8:43 AM Darin Waters [name], M.D.  Primary Physician: Darin Net, NP  Reason for visit:  GERD, personal hx of colon polyps. (Adenomatous polyp x 1 - 02/2014)  History of present illness:  Patient presents for personal hx of GERD, uncomplicated, responsive to medical therapy. Patient denies intractable heartburn, dysphagia, hemetemesis, abdominal pain, nausea or vomiting.                            Patient presents for colonoscopy for a personal hx of colon polyps. The patient denies abdominal pain, abnormal weight loss or rectal bleeding.      Current Facility-Administered Medications:  .  0.9 %  sodium chloride infusion, , Intravenous, Continuous, [place], Darin Pike, MD, Last Rate: 20 mL/hr at 08/03/19 0835, New Bag at 08/03/19 0835  Medications Prior to Admission  Medication Sig Dispense Refill Last Dose  . amLODipine-benazepril (LOTREL) 5-20 MG capsule Take 1 capsule by mouth daily before breakfast.    08/02/2019 at Unknown time  . citalopram (CELEXA) 10 MG tablet Take 10 mg by mouth daily before breakfast.    Past Week at Unknown time  . Fluticasone-Umeclidin-Vilant (TRELEGY ELLIPTA) 100-62.5-25 MCG/INH AEPB Inhale 1 puff into the lungs daily. 60 each 2 08/03/2019 at Unknown time  . Insulin Glargine (LANTUS SOLOSTAR) 100 UNIT/ML Solostar Pen Inject 34 Units into the skin at bedtime.    08/02/2019 at Unknown time  . oxyCODONE-acetaminophen (PERCOCET) 10-325 MG tablet Take 1 tablet by mouth every 4 (four) hours as needed for pain. 100 tablet 0 08/02/2019 at Unknown time  . PROAIR HFA 108 (90 Base) MCG/ACT inhaler Take 2 puffs by mouth every 6 (six) hours as needed for wheezing or shortness of breath.   2 08/02/2019 at Unknown time  . SYMBICORT 160-4.5 MCG/ACT inhaler Inhale 1 puff into the lungs 2 (two) times daily. 1 Inhaler 2 08/03/2019 at Unknown time  . aspirin 81 MG tablet Take 81 mg by mouth daily.   Not Taking at Unknown time  . cyclobenzaprine  (FLEXERIL) 10 MG tablet Take 1 tablet (10 mg total) by mouth 3 (three) times daily as needed. (Patient not taking: Reported on 08/03/2019) 50 tablet 1 Not Taking at Unknown time  . docusate sodium (COLACE) 100 MG capsule Take 1 capsule (100 mg total) by mouth 2 (two) times daily. (Patient not taking: Reported on 08/03/2019) 60 capsule 0 Not Taking at Unknown time  . esomeprazole (NEXIUM) 20 MG capsule Take 20 mg by mouth daily before breakfast.   08/01/2019  . furosemide (LASIX) 20 MG tablet Take 20 mg by mouth daily.  1 Not Taking at Unknown time  . gabapentin (NEURONTIN) 300 MG capsule Take 1 capsule by mouth 2 (two) times daily.   08/01/2019  . glimepiride (AMARYL) 2 MG tablet Take 2 mg by mouth daily with breakfast.   08/01/2019  . metFORMIN (GLUCOPHAGE) 500 MG tablet Take 1,000 mg by mouth 2 (two) times daily with a meal.    08/01/2019  . Multiple Vitamins-Minerals (MULTIVITAMIN WITH MINERALS) tablet Take 1 tablet by mouth daily.   08/01/2019  . quiNINE (QUALAQUIN) 324 MG capsule Take 1 capsule by mouth every other day.   1 08/01/2019  . simvastatin (ZOCOR) 20 MG tablet Take 20 mg by mouth daily at 6 PM.    08/01/2019     No Known Allergies   Past Medical History:  Diagnosis Date  . Arthritis  DDD- lumbar , thumbs - both hands   . COPD (chronic obstructive pulmonary disease) (HCC)   . Depression   . Diabetes mellitus without complication (HCC)   . GERD (gastroesophageal reflux disease)   . History of kidney stones 1990's    lithotripsy done   . Hypertension   . Sleep apnea 01/30/2016   [facility] in [place], results not avail to pt. yet    Review of systems:  Otherwise negative.    Physical Exam  Gen: Alert, oriented. Appears stated age.  HEENT: [place]/AT. PERRLA. Lungs: CTA, no wheezes. CV: RR nl S1, S2. Abd: soft, benign, no masses. BS+ Ext: No edema. Pulses 2+    Planned procedures: Proceed with EGD and colonoscopy. The patient understands the nature of the planned procedure,  indications, risks, alternatives and potential complications including but not limited to bleeding, infection, perforation, damage to internal organs and possible oversedation/side effects from anesthesia. The patient agrees and gives consent to proceed.  Please refer to procedure notes for findings, recommendations and patient disposition/instructions.     Darin Waters K. [name], M.D. Gastroenterology 08/03/2019  8:43 AM

## 2019-08-03 NOTE — Anesthesia Preprocedure Evaluation (Signed)
Anesthesia Evaluation  Patient identified by MRN, date of birth, ID band Patient awake    Reviewed: Allergy & Precautions, H&P , NPO status , Patient's Chart, lab work & pertinent test results, reviewed documented beta blocker date and time   Airway Mallampati: II   Neck ROM: full    Dental  (+) Poor Dentition   Pulmonary sleep apnea , COPD, former smoker,    Pulmonary exam normal        Cardiovascular hypertension, On Medications + CAD  Normal cardiovascular exam Rhythm:regular Rate:Normal     Neuro/Psych PSYCHIATRIC DISORDERS Depression  Neuromuscular disease    GI/Hepatic Neg liver ROS, GERD  Medicated,  Endo/Other  negative endocrine ROSdiabetes  Renal/GU Renal disease  negative genitourinary   Musculoskeletal   Abdominal   Peds  Hematology negative hematology ROS (+)   Anesthesia Other Findings Past Medical History: No date: Arthritis     Comment:  DDD- lumbar , thumbs - both hands  No date: COPD (chronic obstructive pulmonary disease) (HCC) No date: Depression No date: Diabetes mellitus without complication (HCC) No date: GERD (gastroesophageal reflux disease) 1990's : History of kidney stones     Comment:  lithotripsy done  No date: Hypertension 01/30/2016: Sleep apnea     Comment:  [facility] in [place], results not avail to pt.               yet Past Surgical History: 07/18/2015: ANTERIOR CERVICAL DECOMP/DISCECTOMY FUSION; N/A     Comment:  Procedure: CERVICALFIVE-SIX, CERVICAL SIX-SEVEN ANTERIOR              CERVICAL DECOMPRESSION/DISCECTOMY FUSION;  Surgeon:               Tressie Stalker, MD;  Location: [facility];  Service:               Neurosurgery;  Laterality: N/A;  C56 C67 anterior               cervical decompression with fusion interbody prosthesis               plating and bonegraft No date: COLONOSCOPY 4-5 yrs ago: RECTAL POLYPECTOMY   Reproductive/Obstetrics negative OB  ROS                             Anesthesia Physical Anesthesia Plan  ASA: III  Anesthesia Plan: General   Post-op Pain Management:    Induction:   PONV Risk Score and Plan:   Airway Management Planned:   Additional Equipment:   Intra-op Plan:   Post-operative Plan:   Informed Consent: I have reviewed the patients History and Physical, chart, labs and discussed the procedure including the risks, benefits and alternatives for the proposed anesthesia with the patient or authorized representative who has indicated his/her understanding and acceptance.     Dental Advisory Given  Plan Discussed with: CRNA  Anesthesia Plan Comments:         Anesthesia Quick Evaluation

## 2019-08-04 ENCOUNTER — Encounter: Payer: Self-pay | Admitting: *Deleted

## 2019-08-04 LAB — SURGICAL PATHOLOGY

## 2019-08-08 NOTE — Anesthesia Postprocedure Evaluation (Signed)
Anesthesia Post Note  Patient: TERIQUE KAWABATA  Procedure(s) Performed: ESOPHAGOGASTRODUODENOSCOPY (EGD) WITH PROPOFOL (N/A ) COLONOSCOPY WITH PROPOFOL (N/A )  Patient location during evaluation: PACU Anesthesia Type: General Level of consciousness: awake and alert Pain management: pain level controlled Vital Signs Assessment: post-procedure vital signs reviewed and stable Respiratory status: spontaneous breathing, nonlabored ventilation, respiratory function stable and patient connected to nasal cannula oxygen Cardiovascular status: blood pressure returned to baseline and stable Postop Assessment: no apparent nausea or vomiting Anesthetic complications: no     Last Vitals:  Vitals:   08/03/19 0931 08/03/19 0941  BP: 122/64 112/68  Pulse:    Resp:    Temp:    SpO2:      Last Pain:  Vitals:   08/03/19 0941  TempSrc:   PainSc: 0-No pain                 Yevette Edwards

## 2019-10-09 DIAGNOSIS — I2510 Atherosclerotic heart disease of native coronary artery without angina pectoris: Secondary | ICD-10-CM | POA: Insufficient documentation

## 2019-10-09 DIAGNOSIS — M899 Disorder of bone, unspecified: Secondary | ICD-10-CM | POA: Insufficient documentation

## 2019-10-09 DIAGNOSIS — Z79899 Other long term (current) drug therapy: Secondary | ICD-10-CM | POA: Insufficient documentation

## 2019-10-09 DIAGNOSIS — J449 Chronic obstructive pulmonary disease, unspecified: Secondary | ICD-10-CM | POA: Insufficient documentation

## 2019-10-09 DIAGNOSIS — Z789 Other specified health status: Secondary | ICD-10-CM | POA: Insufficient documentation

## 2019-10-09 NOTE — Patient Instructions (Signed)
____________________________________________________________________________________________  General Risks and Possible Complications  Patient Responsibilities: It is important that you read this as it is part of your informed consent. It is our duty to inform you of the risks and possible complications associated with treatments offered to you. It is your responsibility as a patient to read this and to ask questions about anything that is not clear or that you believe was not covered in this document.  Patient's Rights: You have the right to refuse treatment. You also have the right to change your mind, even after initially having agreed to have the treatment done. However, under this last option, if you wait until the last second to change your mind, you may be charged for the materials used up to that point.  Introduction: Medicine is not an exact science. Everything in Medicine, including the lack of treatment(s), carries the potential for danger, harm, or loss (which is by definition: Risk). In Medicine, a complication is a secondary problem, condition, or disease that can aggravate an already existing one. All treatments carry the risk of possible complications. The fact that a side effects or complications occurs, does not imply that the treatment was conducted incorrectly. It must be clearly understood that these can happen even when everything is done following the highest safety standards.  No treatment: You can choose not to proceed with the proposed treatment alternative. The "PRO(s)" would include: avoiding the risk of complications associated with the therapy. The "CON(s)" would include: not getting any of the treatment benefits. These benefits fall under one of three categories: diagnostic; therapeutic; and/or palliative. Diagnostic benefits include: getting information which can ultimately lead to improvement of the disease or symptom(s). Therapeutic benefits are those associated with the  successful treatment of the disease. Finally, palliative benefits are those related to the decrease of the primary symptoms, without necessarily curing the condition (example: decreasing the pain from a flare-up of a chronic condition, such as incurable terminal cancer).  General Risks and Complications: These are associated to most interventional treatments. They can occur alone, or in combination. They fall under one of the following six (6) categories: no benefit or worsening of symptoms; bleeding; infection; nerve damage; allergic reactions; and/or death. 1. No benefits or worsening of symptoms: In Medicine there are no guarantees, only probabilities. No healthcare provider can ever guarantee that a medical treatment will work, they can only state the probability that it may. Furthermore, there is always the possibility that the condition may worsen, either directly, or indirectly, as a consequence of the treatment. 2. Bleeding: This is more common if the patient is taking a blood thinner, either prescription or over the counter (example: Goody Powders, Fish oil, Aspirin, Garlic, etc.), or if suffering a condition associated with impaired coagulation (example: Hemophilia, cirrhosis of the liver, low platelet counts, etc.). However, even if you do not have one on these, it can still happen. If you have any of these conditions, or take one of these drugs, make sure to notify your treating physician. 3. Infection: This is more common in patients with a compromised immune system, either due to disease (example: diabetes, cancer, human immunodeficiency virus [HIV], etc.), or due to medications or treatments (example: therapies used to treat cancer and rheumatological diseases). However, even if you do not have one on these, it can still happen. If you have any of these conditions, or take one of these drugs, make sure to notify your treating physician. 4. Nerve Damage: This is more common when the   treatment is  an invasive one, but it can also happen with the use of medications, such as those used in the treatment of cancer. The damage can occur to small secondary nerves, or to large primary ones, such as those in the spinal cord and brain. This damage may be temporary or permanent and it may lead to impairments that can range from temporary numbness to permanent paralysis and/or brain death. 5. Allergic Reactions: Any time a substance or material comes in contact with our body, there is the possibility of an allergic reaction. These can range from a mild skin rash (contact dermatitis) to a severe systemic reaction (anaphylactic reaction), which can result in death. 6. Death: In general, any medical intervention can result in death, most of the time due to an unforeseen complication. ____________________________________________________________________________________________   

## 2019-10-09 NOTE — Progress Notes (Signed)
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 COLONOSCOPY    . COLONOSCOPY WITH PROPOFOL N/A 08/03/2019   Procedure: COLONOSCOPY WITH PROPOFOL;  Surgeon: Toledo, [name], MD;  Location: [facility];  Service: Gastroenterology;  Laterality: N/A;  . ESOPHAGOGASTRODUODENOSCOPY (EGD) WITH PROPOFOL N/A 08/03/2019   Procedure: ESOPHAGOGASTRODUODENOSCOPY (EGD) WITH PROPOFOL;  Surgeon: Toledo, [name], MD;  Location: [facility];  Service: Gastroenterology;  Laterality: N/A;  . RECTAL POLYPECTOMY  4-5 yrs ago   Active Ambulatory Problems    Diagnosis Date Noted  . Cervical herniated disc 07/18/2015  . Spondylolisthesis of lumbar region 02/13/2016  . Coronary artery disease due to lipid rich plaque 05/14/2017  . Obstructive chronic bronchitis without exacerbation ([place]) 06/14/2013  . Chronic pain syndrome 06/14/2013  . Chronic, continuous use of opioids 05/14/2017  . DDD (degenerative disc disease),  lumbar 02/28/2014  . Depression (emotion) 06/14/2013  . Diabetes type 2, uncontrolled ([place]) 03/31/2006  . Fatty liver disease, nonalcoholic 47/42/5956  . Hyperlipidemia, unspecified 06/14/2013  . Hypertension 06/14/2013  . Lumbar radiculitis 01/25/2014  . Microalbuminuric diabetic nephropathy ([place]) 06/14/2013  . Obesity (BMI 30-39.9) 05/14/2017  . SOB (shortness of  breath) on exertion 07/13/2014  . Tobacco abuse 06/14/2013  . Nicotine dependence with current use 06/23/2017  . Morbid obesity ([place]) 06/23/2017  . COPD (chronic obstructive pulmonary disease) ([place]) 10/09/2019  . CAD (coronary artery disease) 10/09/2019  . Disorder of skeletal system 10/09/2019  . Pharmacologic therapy 10/09/2019  . Problems influencing health status 10/09/2019  . 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  . Chronic lower extremity pain (1ry area of Pain) (Bilateral) (R>L) 10/10/2019   Resolved Ambulatory Problems    Diagnosis Date Noted  . No Resolved Ambulatory Problems   Past Medical History:  Diagnosis Date  . Arthritis   . Depression   . Diabetes mellitus without complication ([place])   . GERD (gastroesophageal reflux disease)   . History of kidney stones 1990's   . Sleep apnea 01/30/2016   Constitutional Exam  General appearance: Well nourished, well developed, and well hydrated. In no apparent acute distress Vitals:   10/10/19 1116  BP: 99/60  Pulse: 66  Resp: 16  Temp: (!) 97.5 F (36.4 C)  TempSrc: Temporal  SpO2: 95%  Weight: 220 lb (99.8 kg)  Height: '5\' 6"'  (1.676 m)   BMI Assessment: Estimated body mass index is 35.51 kg/m as calculated from the following:   Height as of this encounter: '5\' 6"'  (1.676 m).   Weight as of this encounter: 220 lb (99.8 kg).  BMI interpretation table: BMI level Category Range association with higher incidence of chronic pain  <18 kg/m2 Underweight    18.5-24.9 kg/m2 Ideal body weight   25-29.9 kg/m2 Overweight Increased incidence by 20%  30-34.9 kg/m2 Obese (Class I) Increased incidence by 68%  35-39.9 kg/m2 Severe obesity (Class II) Increased incidence by 136%  >40 kg/m2 Extreme obesity (Class III) Increased incidence by 254%   Patient's current BMI Ideal Body weight  Body mass index is 35.51 kg/m. Ideal body weight: 63.8 kg (140 lb 10.5 oz) Adjusted ideal body weight: 78.2 kg (172 lb 6.3 oz)   BMI Readings from Last 4 Encounters:  10/10/19 35.51 kg/m  08/03/19 35.83 kg/m  07/11/19 39.06 kg/m  05/25/19 35.67 kg/m   Wt Readings from Last 4 Encounters:  10/10/19 220 lb (99.8 kg)  08/03/19 222 lb (100.7 kg)  07/11/19 242 lb (109.8 kg)  05/25/19 221 lb (100.2 kg)    Psych/Mental status: Alert, oriented x 3 (person, place, & time)       Eyes: PERLA Respiratory: No evidence of acute respiratory distress  Lumbar Exam  Skin & Axial Inspection: Well healed scar from previous spine surgery detected Alignment: Symmetrical Functional ROM: Decreased ROM affecting both sides Stability: No instability detected Muscle Tone/Strength: Functionally intact. No obvious neuro-muscular anomalies detected. Sensory (Neurological): Movement-associated discomfort Palpation: No palpable anomalies       Provocative Tests: Hyperextension/rotation test: (-)       Lumbar quadrant test (Kemp's test): (-)       Lateral bending test: (-)       Patrick's Maneuver: (-)                   FABER* test: (-)                   S-I anterior distraction/compression test: deferred today         S-I lateral compression test: deferred today         S-I Thigh-thrust test: deferred today  S-I Gaenslen's test: deferred today         *(Flexion, ABduction and External Rotation)  Gait & Posture Assessment  Ambulation: Unassisted Gait: Relatively normal for age and body habitus Posture: WNL   Lower Extremity Exam    Side: Right lower extremity   Side: Left lower extremity  Stability: No instability observed          Stability: No instability observed          Skin & Extremity Inspection: Skin color, temperature, and hair growth are WNL. No peripheral edema or cyanosis. No masses, redness, swelling, asymmetry, or associated skin lesions. No contractures.  Skin & Extremity Inspection: Skin color, temperature, and hair growth are WNL. No peripheral edema or cyanosis. No masses, redness, swelling, asymmetry, or associated skin lesions. No contractures.  Functional ROM: Diminished ROM for hip joint          Functional ROM: Diminished ROM                  Muscle Tone/Strength: Able to Toe-walk & Heel-walk without problems  Muscle Tone/Strength: Able to Toe-walk & Heel-walk without problems  Sensory (Neurological): Unimpaired        Sensory (Neurological): Unimpaired        DTR: Patellar: deferred today Achilles: deferred today Plantar: deferred today  DTR: Patellar: deferred today Achilles: deferred today Plantar: deferred today  Palpation: No palpable anomalies  Palpation: No palpable anomalies   Assessment  Primary Diagnosis & Pertinent Problem List: The primary encounter diagnosis was Chronic pain syndrome. Diagnoses of Pharmacologic therapy, Disorder of skeletal system, Problems influencing health status, Chronic low back pain (2ry area of Pain) (Bilateral) (L>R) w/ sciatica (Bilateral), Failed back surgical syndrome, Chronic lower extremity pain (1ry area of Pain) (Bilateral) (R>L), and DDD (degenerative disc disease), lumbar were also pertinent to this visit.  Visit Diagnosis (New problems to examiner): 1. Chronic pain syndrome   2. Pharmacologic therapy   3. Disorder of skeletal system   4. Problems influencing health status   5. Chronic low back pain (2ry area of Pain) (Bilateral) (L>R) w/ sciatica (Bilateral)   6. Failed back surgical syndrome   7. Chronic lower extremity pain (1ry area of Pain) (Bilateral) (R>L)   8. DDD  (degenerative disc disease), lumbar    Plan of Care (Initial workup plan)  Note: Mr. Evitts was reminded that as per protocol, today's visit has been an evaluation only. We have not taken over the patient's controlled substance management.  Problem-specific plan: No problem-specific Assessment & Plan notes found for this encounter.   Lab Orders     Compliance Drug Analysis, Ur     Comp. Metabolic Panel (12)     Magnesium     Vitamin B12     Sedimentation rate     25-Hydroxy vitamin D Lcms D2+D3     C-reactive protein  Imaging Orders     DG Lumbar Spine Complete W/Bend Referral Orders  No referral(s) requested today   Procedure Orders    No procedure(s) ordered today   Pharmacotherapy (current): Medications ordered:  No orders of the defined types were placed in this encounter.  Medications administered during this visit: Laroy T. Rutkowski had no medications administered during this visit.   Pharmacological management options:  Opioid Analgesics: The patient was informed that there is no guarantee that he would be a candidate for opioid analgesics. The decision will be made following CDC guidelines. This decision will be based on the results of diagnostic  studies, as well as Mr. Postiglione risk profile.   Membrane stabilizer: To be determined at a later time  Muscle relaxant: To be determined at a later time  NSAID: To be determined at a later time  Other analgesic(s): To be determined at a later time   Interventional management options: Mr. Creighton was informed that there is no guarantee that he would be a candidate for interventional therapies. The decision will be based on the results of diagnostic studies, as well as Mr. Rigor risk profile.  Procedure(s) under consideration:  Diagnostic midline caudal ESI + diagnostic epidurogram #1    Provider-requested follow-up: Return for F2F, (2V-75mn), on eval day.  Future Appointments  Date [place]  01/09/2020 11:45 AM KAllyne Gee MD [facility] None    Note by: FGaspar Cola MD Date: 10/10/2019; Time: 4:18 PM

## 2019-10-10 ENCOUNTER — Ambulatory Visit: Payer: Medicare HMO | Attending: Pain Medicine | Admitting: Pain Medicine

## 2019-10-10 ENCOUNTER — Encounter: Payer: Self-pay | Admitting: Pain Medicine

## 2019-10-10 ENCOUNTER — Other Ambulatory Visit: Payer: Self-pay

## 2019-10-10 VITALS — BP 99/60 | HR 66 | Temp 97.5°F | Resp 16 | Ht 66.0 in | Wt 220.0 lb

## 2019-10-10 DIAGNOSIS — M51369 Other intervertebral disc degeneration, lumbar region without mention of lumbar back pain or lower extremity pain: Secondary | ICD-10-CM

## 2019-10-10 DIAGNOSIS — G894 Chronic pain syndrome: Secondary | ICD-10-CM

## 2019-10-10 DIAGNOSIS — M961 Postlaminectomy syndrome, not elsewhere classified: Secondary | ICD-10-CM

## 2019-10-10 DIAGNOSIS — M79605 Pain in left leg: Secondary | ICD-10-CM | POA: Diagnosis present

## 2019-10-10 DIAGNOSIS — G8929 Other chronic pain: Secondary | ICD-10-CM | POA: Diagnosis present

## 2019-10-10 DIAGNOSIS — M5441 Lumbago with sciatica, right side: Secondary | ICD-10-CM | POA: Diagnosis present

## 2019-10-10 DIAGNOSIS — M5136 Other intervertebral disc degeneration, lumbar region: Secondary | ICD-10-CM | POA: Diagnosis present

## 2019-10-10 DIAGNOSIS — M79604 Pain in right leg: Secondary | ICD-10-CM | POA: Insufficient documentation

## 2019-10-10 DIAGNOSIS — M5442 Lumbago with sciatica, left side: Secondary | ICD-10-CM | POA: Diagnosis present

## 2019-10-10 DIAGNOSIS — Z79899 Other long term (current) drug therapy: Secondary | ICD-10-CM | POA: Diagnosis present

## 2019-10-10 DIAGNOSIS — M899 Disorder of bone, unspecified: Secondary | ICD-10-CM

## 2019-10-10 DIAGNOSIS — Z789 Other specified health status: Secondary | ICD-10-CM | POA: Diagnosis not present

## 2019-10-10 NOTE — Progress Notes (Signed)
Safety precautions to be maintained throughout the outpatient stay will include: orient to surroundings, keep bed in low position, maintain call bell within reach at all times, provide assistance with transfer out of bed and ambulation.  

## 2019-10-11 ENCOUNTER — Ambulatory Visit
Admission: RE | Admit: 2019-10-11 | Discharge: 2019-10-11 | Disposition: A | Discharge status: Home / Self Care | Payer: Medicare HMO | Attending: Pain Medicine | Admitting: Pain Medicine

## 2019-10-11 ENCOUNTER — Ambulatory Visit
Admission: RE | Admit: 2019-10-11 | Discharge: 2019-10-11 | Disposition: A | Discharge status: Home / Self Care | Payer: Medicare HMO | Source: Ambulatory Visit | Attending: Pain Medicine | Admitting: Pain Medicine

## 2019-10-11 DIAGNOSIS — M5442 Lumbago with sciatica, left side: Secondary | ICD-10-CM | POA: Diagnosis not present

## 2019-10-11 DIAGNOSIS — M5441 Lumbago with sciatica, right side: Secondary | ICD-10-CM | POA: Diagnosis present

## 2019-10-11 DIAGNOSIS — M5136 Other intervertebral disc degeneration, lumbar region: Secondary | ICD-10-CM | POA: Diagnosis present

## 2019-10-11 DIAGNOSIS — G8929 Other chronic pain: Secondary | ICD-10-CM

## 2019-10-11 DIAGNOSIS — M961 Postlaminectomy syndrome, not elsewhere classified: Secondary | ICD-10-CM | POA: Insufficient documentation

## 2019-10-14 LAB — COMPLIANCE DRUG ANALYSIS, UR

## 2019-10-18 LAB — COMP. METABOLIC PANEL (12)
AST: 30 IU/L (ref 0–40)
Albumin/Globulin Ratio: 1.7 (ref 1.2–2.2)
Albumin: 4.6 g/dL (ref 3.8–4.9)
Alkaline Phosphatase: 59 IU/L (ref 48–121)
BUN/Creatinine Ratio: 14 (ref 10–24)
BUN: 20 mg/dL (ref 8–27)
Bilirubin Total: 0.2 mg/dL (ref 0.0–1.2)
Calcium: 9.8 mg/dL (ref 8.6–10.2)
Chloride: 100 mmol/L (ref 96–106)
Creatinine, Ser: 1.43 mg/dL — ABNORMAL HIGH (ref 0.76–1.27)
GFR calc Af Amer: 61 mL/min/{1.73_m2} (ref >59)
GFR calc non Af Amer: 53 mL/min/{1.73_m2} — ABNORMAL LOW (ref >59)
Globulin, Total: 2.7 g/dL (ref 1.5–4.5)
Glucose: 165 mg/dL — ABNORMAL HIGH (ref 65–99)
Potassium: 5.7 mmol/L — ABNORMAL HIGH (ref 3.5–5.2)
Sodium: 136 mmol/L (ref 134–144)
Total Protein: 7.3 g/dL (ref 6.0–8.5)

## 2019-10-18 LAB — 25-HYDROXY VITAMIN D LCMS D2+D3
25-Hydroxy, Vitamin D-2: <1 ng/mL
25-Hydroxy, Vitamin D-3: 33 ng/mL
25-Hydroxy, Vitamin D: 33 ng/mL

## 2019-10-18 LAB — MAGNESIUM: Magnesium: 2 mg/dL (ref 1.6–2.3)

## 2019-10-18 LAB — VITAMIN B12: Vitamin B-12: 536 pg/mL (ref 232–1245)

## 2019-10-18 LAB — SEDIMENTATION RATE: Sed Rate: 16 mm/hr (ref 0–30)

## 2019-10-18 LAB — C-REACTIVE PROTEIN: CRP: 2 mg/L (ref 0–10)

## 2019-11-13 NOTE — Patient Instructions (Addendum)
____________________________________________________________________________________________  Preparing for Procedure with Sedation  Procedure appointments are limited to planned procedures: . No Prescription Refills. . No disability issues will be discussed. . No medication changes will be discussed.  Instructions: . Oral Intake: Do not eat or drink anything for at least 8 hours prior to your procedure. (Exception: Blood Pressure Medication. See below.) . Transportation: Unless otherwise stated by your physician, you may drive yourself after the procedure. . Blood Pressure Medicine: Do not forget to take your blood pressure medicine with a sip of water the morning of the procedure. If your Diastolic (lower reading)is above 100 mmHg, elective cases will be cancelled/rescheduled. . Blood thinners: These will need to be stopped for procedures. Notify our staff if you are taking any blood thinners. Depending on which one you take, there will be specific instructions on how and when to stop it. . Diabetics on insulin: Notify the staff so that you can be scheduled 1st case in the morning. If your diabetes requires high dose insulin, take only  of your normal insulin dose the morning of the procedure and notify the staff that you have done so. . Preventing infections: Shower with an antibacterial soap the morning of your procedure. . Build-up your immune system: Take 1000 mg of Vitamin C with every meal (3 times a day) the day prior to your procedure. . Antibiotics: Inform the staff if you have a condition or reason that requires you to take antibiotics before dental procedures. . Pregnancy: If you are pregnant, call and cancel the procedure. . Sickness: If you have a cold, fever, or any active infections, call and cancel the procedure. . Arrival: You must be in the facility at least 30 minutes prior to your scheduled procedure. . Children: Do not bring children with you. . Dress appropriately:  Bring dark clothing that you would not mind if they get stained. . Valuables: Do not bring any jewelry or valuables.  Reasons to call and reschedule or cancel your procedure: (Following these recommendations will minimize the risk of a serious complication.) . Surgeries: Avoid having procedures within 2 weeks of any surgery. (Avoid for 2 weeks before or after any surgery). . Flu Shots: Avoid having procedures within 2 weeks of a flu shots or . (Avoid for 2 weeks before or after immunizations). . Barium: Avoid having a procedure within 7-10 days after having had a radiological study involving the use of radiological contrast. (Myelograms, Barium swallow or enema study). . Heart attacks: Avoid any elective procedures or surgeries for the initial 6 months after a "Myocardial Infarction" (Heart Attack). . Blood thinners: It is imperative that you stop these medications before procedures. Let us know if you if you take any blood thinner.  . Infection: Avoid procedures during or within two weeks of an infection (including chest colds or gastrointestinal problems). Symptoms associated with infections include: Localized redness, fever, chills, night sweats or profuse sweating, burning sensation when voiding, cough, congestion, stuffiness, runny nose, sore throat, diarrhea, nausea, vomiting, cold or Flu symptoms, recent or current infections. It is specially important if the infection is over the area that we intend to treat. . Heart and lung problems: Symptoms that may suggest an active cardiopulmonary problem include: cough, chest pain, breathing difficulties or shortness of breath, dizziness, ankle swelling, uncontrolled high or unusually low blood pressure, and/or palpitations. If you are experiencing any of these symptoms, cancel your procedure and contact your primary care physician for an evaluation.  Remember:  Regular Business hours are:    Monday to Thursday 8:00 AM to 4:00 PM  Provider's  Schedule: Delano Metz, MD:  Procedure days: Tuesday and Thursday 7:30 AM to 4:00 PM  Edward Jolly, MD:  Procedure days: Monday and Wednesday 7:30 AM to 4:00 PM ____________________________________________________________________________________________     ____________________________________________________________________________________________  Drug Holidays (Slow)  What is a "Drug Holiday"? Drug Holiday: is the name given to the period of time during which a patient stops taking a medication(s) for the purpose of eliminating tolerance to the drug.  Benefits . Improved effectiveness of opioids. . Decreased opioid dose needed to achieve benefits. . Improved pain with lesser dose.  What is tolerance? Tolerance: is the progressive decreased in effectiveness of a drug due to its repetitive use. With repetitive use, the body gets use to the medication and as a consequence, it loses its effectiveness. This is a common problem seen with opioid pain medications. As a result, a larger dose of the drug is needed to achieve the same effect that used to be obtained with a smaller dose.  How long should a "Drug Holiday" last? You should stay off of the pain medicine for at least 14 consecutive days. (2 weeks)  Should I stop the medicine "cold [place]"? No. You should always coordinate with your Pain Specialist so that he/she can provide you with the correct medication dose to make the transition as smoothly as possible.  How do I stop the medicine? Slowly. You will be instructed to decrease the daily amount of pills that you take by one (1) pill every seven (7) days. This is called a "slow downward taper" of your dose. For example: if you normally take four (4) pills per day, you will be asked to drop this dose to three (3) pills per day for seven (7) days, then to two (2) pills per day for seven (7) days, then to one (1) per day for seven (7) days, and at the end of those last seven (7)  days, this is when the "Drug Holiday" would start.   Will I have withdrawals? By doing a "slow downward taper" like this one, it is unlikely that you will experience any significant withdrawal symptoms. Typically, what triggers withdrawals is the sudden stop of a high dose opioid therapy. Withdrawals can usually be avoided by slowly decreasing the dose over a prolonged period of time. If you do not follow these instructions and decide to stop your medication abruptly, withdrawals may be possible.  What are withdrawals? Withdrawals: refers to the wide range of symptoms that occur after stopping or dramatically reducing opiate drugs after heavy and prolonged use. Withdrawal symptoms do not occur to patients that use low dose opioids, or those who take the medication sporadically. Contrary to benzodiazepine (example: Valium, Xanax, etc.) or alcohol withdrawals ("Delirium Tremens"), opioid withdrawals are not lethal. Withdrawals are the physical manifestation of the body getting rid of the excess receptors.  Expected Symptoms Early symptoms of withdrawal may include: . Agitation . Anxiety . Muscle aches . Increased tearing . Insomnia . Runny nose . Sweating . Yawning  Late symptoms of withdrawal may include: . Abdominal cramping . Diarrhea . Dilated pupils . Goose bumps . Nausea . Vomiting  Will I experience withdrawals? Due to the slow nature of the taper, it is very unlikely that you will experience any.  What is a slow taper? Taper: refers to the gradual decrease in dose.  (Last update: 10/19/2019) ____________________________________________________________________________________________    ____________________________________________________________________________________________  Medication Rules  Purpose: To inform patients,  and their family members, of our rules and regulations.  Applies to: All patients receiving prescriptions (written or electronic).  Pharmacy of  record: Pharmacy where electronic prescriptions will be sent. If written prescriptions are taken to a different pharmacy, please inform the nursing staff. The pharmacy listed in the electronic medical record should be the one where you would like electronic prescriptions to be sent.  Electronic prescriptions: In compliance with the [facility] Strengthen Opioid Misuse Prevention (STOP) Act of 2017 (Session Conni Elliot [phone]), effective March 31, 2018, all controlled substances must be electronically prescribed. Calling prescriptions to the pharmacy will cease to exist.  Prescription refills: Only during scheduled appointments. Applies to all prescriptions.  NOTE: The following applies primarily to controlled substances (Opioid* Pain Medications).   Type of encounter (visit): For patients receiving controlled substances, face-to-face visits are required. (Not an option or up to the patient.)  Patient's responsibilities: 1. Pain Pills: Bring all pain pills to every appointment (except for procedure appointments). 2. Pill Bottles: Bring pills in original pharmacy bottle. Always bring the newest bottle. Bring bottle, even if empty. 3. Medication refills: You are responsible for knowing and keeping track of what medications you take and those you need refilled. The day before your appointment: write a list of all prescriptions that need to be refilled. The day of the appointment: give the list to the admitting nurse. Prescriptions will be written only during appointments. No prescriptions will be written on procedure days. If you forget a medication: it will not be "Called in", "Faxed", or "electronically sent". You will need to get another appointment to get these prescribed. No early refills. Do not call asking to have your prescription filled early. 4. Prescription Accuracy: You are responsible for carefully inspecting your prescriptions before leaving our office. Have the discharge nurse  carefully go over each prescription with you, before taking them home. Make sure that your name is accurately spelled, that your address is correct. Check the name and dose of your medication to make sure it is accurate. Check the number of pills, and the written instructions to make sure they are clear and accurate. Make sure that you are given enough medication to last until your next medication refill appointment. 5. Taking Medication: Take medication as prescribed. When it comes to controlled substances, taking less pills or less frequently than prescribed is permitted and encouraged. Never take more pills than instructed. Never take medication more frequently than prescribed.  6. Inform other Doctors: Always inform, all of your healthcare providers, of all the medications you take. 7. Pain Medication from other Providers: You are not allowed to accept any additional pain medication from any other Doctor or Healthcare provider. There are two exceptions to this rule. (see below) In the event that you require additional pain medication, you are responsible for notifying us, as stated below. 8. Medication Agreement: You are responsible for carefully reading and following our Medication Agreement. This must be signed before receiving any prescriptions from our practice. Safely store a copy of your signed Agreement. Violations to the Agreement will result in no further prescriptions. (Additional copies of our Medication Agreement are available upon request.) 9. Laws, Rules, & Regulations: All patients are expected to follow all [address] and [employer], Rules, [place] [employer]. Ignorance of the Laws does not constitute a valid excuse.  10. Illegal drugs and Controlled Substances: The use of illegal substances (including, but not limited to marijuana and its derivatives) and/or the illegal use of any controlled substances  is strictly prohibited. Violation of this rule may result in the immediate and  permanent discontinuation of any and all prescriptions being written by our practice. The use of any illegal substances is prohibited. 11. Adopted CDC guidelines & recommendations: Target dosing levels will be at or below 60 MME/day. Use of benzodiazepines** is not recommended.  Exceptions: There are only two exceptions to the rule of not receiving pain medications from other Healthcare Providers. 1. Exception #1 (Emergencies): In the event of an emergency (i.e.: accident requiring emergency care), you are allowed to receive additional pain medication. However, you are responsible for: As soon as you are able, call our office [phone], at any time of the day or night, and leave a message stating your name, the date and nature of the emergency, and the name and dose of the medication prescribed. In the event that your call is answered by a member of our staff, make sure to document and save the date, time, and the name of the person that took your information.  2. Exception #2 (Planned Surgery): In the event that you are scheduled by another doctor or dentist to have any type of surgery or procedure, you are allowed (for a period no longer than 30 days), to receive additional pain medication, for the acute post-op pain. However, in this case, you are responsible for picking up a copy of our "Post-op Pain Management for Surgeons" handout, and giving it to your surgeon or dentist. This document is available at our office, and does not require an appointment to obtain it. Simply go to our office during business hours (Monday-Thursday from 8:00 AM to 4:00 PM) (Friday 8:00 AM to 12:00 Noon) or if you have a scheduled appointment with [place], prior to your surgery, and ask for it by name. In addition, you will need to provide [place] with your name, name of your surgeon, type of surgery, and date of procedure or surgery.  *Opioid medications include: morphine, codeine, oxycodone, oxymorphone, hydrocodone,  hydromorphone, meperidine, tramadol, tapentadol, buprenorphine, fentanyl, methadone. **Benzodiazepine medications include: diazepam (Valium), alprazolam (Xanax), clonazepam (Klonopine), lorazepam (Ativan), clorazepate (Tranxene), chlordiazepoxide (Librium), estazolam (Prosom), oxazepam (Serax), temazepam (Restoril), triazolam (Halcion) (Last updated: 05/28/2017) ____________________________________________________________________________________________   ____________________________________________________________________________________________  Medication Recommendations and Reminders  Applies to: All patients receiving prescriptions (written and/or electronic).  Medication Rules & Regulations: These rules and regulations exist for your safety and that of others. They are not flexible and neither are we. Dismissing or ignoring them will be considered "non-compliance" with medication therapy, resulting in complete and irreversible termination of such therapy. (See document titled "Medication Rules" for more details.) In all conscience, because of safety reasons, we cannot continue providing a therapy where the patient does not follow instructions.  Pharmacy of record:   Definition: This is the pharmacy where your electronic prescriptions will be sent.   We do not endorse any particular pharmacy, however, we have experienced problems with [facility] not securing enough medication supply for the community.  We do not restrict you in your choice of pharmacy. However, once we write for your prescriptions, we will NOT be re-sending more prescriptions to fix restricted supply problems created by your pharmacy, or your insurance.   The pharmacy listed in the electronic medical record should be the one where you want electronic prescriptions to be sent.  If you choose to change pharmacy, simply notify our nursing staff.  Recommendations:  Keep all of your pain medications in a safe place, under  lock and key, even  if you live alone. We will NOT replace lost, stolen, or damaged medication.  After you fill your prescription, take 1 week's worth of pills and put them away in a safe place. You should keep a separate, properly labeled bottle for this purpose. The remainder should be kept in the original bottle. Use this as your primary supply, until it runs out. Once it's gone, then you know that you have 1 week's worth of medicine, and it is time to come in for a prescription refill. If you do this correctly, it is unlikely that you will ever run out of medicine.  To make sure that the above recommendation works, it is very important that you make sure your medication refill appointments are scheduled at least 1 week before you run out of medicine. To do this in an effective manner, make sure that you do not leave the office without scheduling your next medication management appointment. Always ask the nursing staff to show you in your prescription , when your medication will be running out. Then arrange for the receptionist to get you a return appointment, at least 7 days before you run out of medicine. Do not wait until you have 1 or 2 pills left, to come in. This is very poor planning and does not take into consideration that we may need to cancel appointments due to bad weather, sickness, or emergencies affecting our staff.  DO NOT ACCEPT A "Partial Fill": If for any reason your pharmacy does not have enough pills/tablets to completely fill or refill your prescription, do not allow for a "partial fill". The law allows the pharmacy to complete that prescription within 72 hours, without requiring a new prescription. If they do not fill the rest of your prescription within those 72 hours, you will need a separate prescription to fill the remaining amount, which we will NOT provide. If the reason for the partial fill is your insurance, you will need to talk to the pharmacist about payment alternatives for  the remaining tablets, but again, DO NOT ACCEPT A PARTIAL FILL, unless you can trust your pharmacist to obtain the remainder of the pills within 72 hours.  Prescription refills and/or changes in medication(s):   Prescription refills, and/or changes in dose or medication, will be conducted only during scheduled medication management appointments. (Applies to both, written and electronic prescriptions.)  No refills on procedure days. No medication will be changed or started on procedure days. No changes, adjustments, and/or refills will be conducted on a procedure day. Doing so will interfere with the diagnostic portion of the procedure.  No phone refills. No medications will be "called into the pharmacy".  No Fax refills.  No weekend refills.  No Holliday refills.  No after hours refills.  Remember:  Business hours are:  Monday to Thursday 8:00 AM to 4:00 PM Provider's Schedule: Delano Metz, MD - Appointments are:  Medication management: Monday and Wednesday 8:00 AM to 4:00 PM Procedure day: Tuesday and Thursday 7:30 AM to 4:00 PM Edward Jolly, MD - Appointments are:  Medication management: Tuesday and Thursday 8:00 AM to 4:00 PM Procedure day: Monday and Wednesday 7:30 AM to 4:00 PM (Last update: 10/19/2019) ____________________________________________________________________________________________   ____________________________________________________________________________________________  CBD (cannabidiol) WARNING  Applicable to: All individuals currently taking or considering taking CBD (cannabidiol) and, more important, all patients taking opioid analgesic controlled substances (pain medication). (Example: oxycodone; oxymorphone; hydrocodone; hydromorphone; morphine; methadone; tramadol; tapentadol; fentanyl; buprenorphine; butorphanol; dextromethorphan; meperidine; codeine; etc.)  Legal status: CBD remains a  Schedule I drug prohibited for any use. CBD is illegal  with one exception. In the [place], CBD has a limited Education officer, environmental (FDA) approval for the treatment of two specific types of epilepsy disorders. Only one CBD product has been approved by the FDA for this purpose: "Epidiolex". FDA is aware that some companies are marketing products containing cannabis and cannabis-derived compounds in ways that violate the [employer], Drug and Cosmetic Act [facility] Act) and that may put the health and safety of consumers at risk. The FDA, a Federal agency, has not enforced the CBD status since 2018.   Legality: Some manufacturers ship CBD products nationally, which is illegal. Often such products are sold online and are therefore available throughout the country. CBD is openly sold in head shops and health food stores in some states where such sales have not been explicitly legalized. Selling unapproved products with unsubstantiated therapeutic claims is not only a violation of the law, but also can put patients at risk, as these products have not been proven to be safe or effective. Federal illegality makes it difficult to conduct research on CBD.  Reference: "FDA Regulation of Cannabis and Cannabis-Derived Products, Including Cannabidiol (CBD)" - [employer]  Warning: CBD is not FDA approved and has not undergo the same manufacturing controls as prescription drugs.  This means that the purity and safety of available CBD may be questionable. Most of the time, despite manufacturer's claims, it is contaminated with THC (delta-9-tetrahydrocannabinol - the chemical in marijuana responsible for the "HIGH").  When this is the case, the [facility] contaminant will trigger a positive urine drug screen (UDS) test for Marijuana (carboxy-THC). Because a positive UDS for any illicit substance is a violation of our medication agreement, your opioid  analgesics (pain medicine) may be permanently discontinued.  MORE ABOUT CBD  General Information: CBD  is a derivative of the Marijuana (cannabis sativa) plant discovered in 17. It is one of the 113 identified substances found in Marijuana. It accounts for up to 40% of the plant's extract. As of 2018, preliminary clinical studies on CBD included research for the treatment of anxiety, movement disorders, and pain. CBD is available and consumed in multiple forms, including inhalation of smoke or vapor, as an aerosol spray, and by mouth. It may be supplied as an oil containing CBD, capsules, dried cannabis, or as a liquid solution. CBD is thought not to be as psychoactive as THC (delta-9-tetrahydrocannabinol - the chemical in marijuana responsible for the "HIGH"). Studies suggest that CBD may interact with different biological target receptors in the body, including cannabinoid and other neurotransmitter receptors. As of 2018 the mechanism of action for its biological effects has not been determined.  Side-effects  Adverse reactions: Dry mouth, diarrhea, decreased appetite, fatigue, drowsiness, malaise, weakness, sleep disturbances, and others.  Drug interactions: CBC may interact with other medications such as blood-thinners. (Last update: 11/05/2019) ____________________________________________________________________________________________   Pain Management Discharge Instructions  General Discharge Instructions :  If you need to reach your doctor call: Monday-Friday 8:00 am - 4:00 pm at [phone] or toll free [phone].  After clinic hours [phone] to have operator reach doctor.  Bring all of your medication bottles to all your appointments in the pain clinic.  To cancel or reschedule your appointment with Pain Management please remember to call 24 hours in advance to avoid a fee.  Refer to the educational materials which you have been given on: General Risks, I had my  Procedure. Discharge Instructions, Post Sedation.  Post Procedure Instructions:  The drugs you were given will stay in your system until tomorrow, so for the next 24 hours you should not drive, make any legal decisions or drink any alcoholic beverages.  You may eat anything you prefer, but it is better to start with liquids then soups and crackers, and gradually work up to solid foods.  Please notify your doctor immediately if you have any unusual bleeding, trouble breathing or pain that is not related to your normal pain.  Depending on the type of procedure that was done, some parts of your body may feel week and/or numb.  This usually clears up by tonight or the next day.  Walk with the use of an assistive device or accompanied by an adult for the 24 hours.  You may use ice on the affected area for the first 24 hours.  Put ice in a Ziploc bag and cover with a towel and place against area 15 minutes on 15 minutes off.  You may switch to heat after 24 hours.GENERAL RISKS AND COMPLICATIONS  What are the risk, side effects and possible complications? Generally speaking, most procedures are safe.  However, with any procedure there are risks, side effects, and the possibility of complications.  The risks and complications are dependent upon the sites that are lesioned, or the type of nerve block to be performed.  The closer the procedure is to the spine, the more serious the risks are.  Great care is taken when placing the radio frequency needles, block needles or lesioning probes, but sometimes complications can occur. 1. Infection: Any time there is an injection through the skin, there is a risk of infection.  This is why sterile conditions are used for these blocks.  There are four possible types of infection. 1. Localized skin infection. 2. Central Nervous System Infection-This can be in the form of Meningitis, which can be deadly. 3. Epidural Infections-This can be in the form of an epidural  abscess, which can cause pressure inside of the spine, causing compression of the spinal cord with subsequent paralysis. This would require an emergency surgery to decompress, and there are no guarantees that the patient would recover from the paralysis. 4. Discitis-This is an infection of the intervertebral discs.  It occurs in about 1% of discography procedures.  It is difficult to treat and it may lead to surgery.        2. Pain: the needles have to go through skin and soft tissues, will cause soreness.       3. Damage to internal structures:  The nerves to be lesioned may be near blood vessels or    other nerves which can be potentially damaged.       4. Bleeding: Bleeding is more common if the patient is taking blood thinners such as  aspirin, Coumadin, Ticiid, Plavix, etc., or if he/she have some genetic predisposition  such as hemophilia. Bleeding into the spinal canal can cause compression of the spinal  cord with subsequent paralysis.  This would require an emergency surgery to  decompress and there are no guarantees that the patient would recover from the  paralysis.       5. Pneumothorax:  Puncturing of a lung is a possibility, every time a needle is introduced in  the area of the chest or upper back.  Pneumothorax refers to free air around the  collapsed lung(s), inside of the thoracic cavity (chest cavity).  Another two possible  complications related to a  similar event would include: Hemothorax and Chylothorax.   These are variations of the Pneumothorax, where instead of air around the collapsed  lung(s), you may have blood or chyle, respectively.       6. Spinal headaches: They may occur with any procedures in the area of the spine.       7. Persistent CSF (Cerebro-Spinal Fluid) leakage: This is a rare problem, but may occur  with prolonged intrathecal or epidural catheters either due to the formation of a fistulous  track or a dural tear.       8. Nerve damage: By working so close to the  spinal cord, there is always a possibility of  nerve damage, which could be as serious as a permanent spinal cord injury with  paralysis.       9. Death:  Although rare, severe deadly allergic reactions known as "Anaphylactic  reaction" can occur to any of the medications used.      10. Worsening of the symptoms:  We can always make thing worse.  What are the chances of something like this happening? Chances of any of this occuring are extremely low.  By statistics, you have more of a chance of getting killed in a motor vehicle accident: while driving to the hospital than any of the above occurring .  Nevertheless, you should be aware that they are possibilities.  In general, it is similar to taking a shower.  Everybody knows that you can slip, hit your head and get killed.  Does that mean that you should not shower again?  Nevertheless always keep in mind that statistics do not mean anything if you happen to be on the wrong side of them.  Even if a procedure has a 1 (one) in a 1,000,000 (million) chance of going wrong, it you happen to be that one..Also, keep in mind that by statistics, you have more of a chance of having something go wrong when taking medications.  Who should not have this procedure? If you are on a blood thinning medication (e.g. Coumadin, Plavix, see list of "Blood Thinners"), or if you have an active infection going on, you should not have the procedure.  If you are taking any blood thinners, please inform your physician.  How should I prepare for this procedure?  Do not eat or drink anything at least six hours prior to the procedure.  Bring a driver with you .  It cannot be a taxi.  Come accompanied by an adult that can drive you back, and that is strong enough to help you if your legs get weak or numb from the local anesthetic.  Take all of your medicines the morning of the procedure with just enough water to swallow them.  If you have diabetes, make sure that you are  scheduled to have your procedure done first thing in the morning, whenever possible.  If you have diabetes, take only half of your insulin dose and notify our nurse that you have done so as soon as you arrive at the clinic.  If you are diabetic, but only take blood sugar pills (oral hypoglycemic), then do not take them on the morning of your procedure.  You may take them after you have had the procedure.  Do not take aspirin or any aspirin-containing medications, at least eleven (11) days prior to the procedure.  They may prolong bleeding.  Wear loose fitting clothing that may be easy to take off and that you would not mind  if it got stained with Betadine or blood.  Do not wear any jewelry or perfume  Remove any nail coloring.  It will interfere with some of our monitoring equipment.  NOTE: Remember that this is not meant to be interpreted as a complete list of all possible complications.  Unforeseen problems may occur.  BLOOD THINNERS The following drugs contain aspirin or other products, which can cause increased bleeding during surgery and should not be taken for 2 weeks prior to and 1 week after surgery.  If you should need take something for relief of minor pain, you may take acetaminophen which is found in Tylenol,m Datril, Anacin-3 and Panadol. It is not blood thinner. The products listed below are.  Do not take any of the products listed below in addition to any listed on your instruction sheet.  A.P.C or A.P.C with Codeine Codeine Phosphate Capsules #3 Ibuprofen Ridaura  ABC compound Congesprin Imuran rimadil  Advil Cope Indocin Robaxisal  Alka-Seltzer Effervescent Pain Reliever and Antacid Coricidin or Coricidin-D  Indomethacin Rufen  Alka-Seltzer plus Cold Medicine Cosprin Ketoprofen S-A-C Tablets  Anacin Analgesic Tablets or Capsules Coumadin Korlgesic Salflex  Anacin Extra Strength Analgesic tablets or capsules CP-2 Tablets Lanoril Salicylate  Anaprox Cuprimine Capsules  Levenox Salocol  Anexsia-D Dalteparin Magan Salsalate  Anodynos Darvon compound Magnesium Salicylate Sine-off  Ansaid Dasin Capsules Magsal Sodium Salicylate  Anturane Depen Capsules Marnal Soma  APF Arthritis pain formula Dewitt's Pills Measurin Stanback  Argesic Dia-Gesic Meclofenamic Sulfinpyrazone  Arthritis Bayer Timed Release Aspirin Diclofenac Meclomen Sulindac  Arthritis pain formula Anacin Dicumarol Medipren Supac  Analgesic (Safety coated) Arthralgen Diffunasal Mefanamic Suprofen  Arthritis Strength Bufferin Dihydrocodeine Mepro Compound Suprol  Arthropan liquid Dopirydamole Methcarbomol with Aspirin Synalgos  ASA tablets/Enseals Disalcid Micrainin Tagament  Ascriptin Doan's Midol Talwin  Ascriptin A/D Dolene Mobidin Tanderil  Ascriptin Extra Strength Dolobid Moblgesic Ticlid  Ascriptin with Codeine Doloprin or Doloprin with Codeine Momentum Tolectin  Asperbuf Duoprin Mono-gesic Trendar  Aspergum Duradyne Motrin or Motrin IB Triminicin  Aspirin plain, buffered or enteric coated Durasal Myochrisine Trigesic  Aspirin Suppositories Easprin Nalfon Trillsate  Aspirin with Codeine Ecotrin Regular or Extra Strength Naprosyn Uracel  Atromid-S Efficin Naproxen Ursinus  Auranofin Capsules Elmiron Neocylate Vanquish  Axotal Emagrin Norgesic Verin  Azathioprine Empirin or Empirin with Codeine Normiflo Vitamin E  Azolid Emprazil Nuprin Voltaren  Bayer Aspirin plain, buffered or children's or timed BC Tablets or powders Encaprin Orgaran Warfarin Sodium  Buff-a-Comp Enoxaparin Orudis Zorpin  Buff-a-Comp with Codeine Equegesic Os-Cal-Gesic   Buffaprin Excedrin plain, buffered or Extra Strength Oxalid   Bufferin Arthritis Strength Feldene Oxphenbutazone   Bufferin plain or Extra Strength Feldene Capsules Oxycodone with Aspirin   Bufferin with Codeine Fenoprofen Fenoprofen Pabalate or Pabalate-SF   Buffets II Flogesic Panagesic   Buffinol plain or Extra Strength Florinal or Florinal with  Codeine Panwarfarin   Buf-Tabs Flurbiprofen Penicillamine   Butalbital Compound Four-way cold tablets Penicillin   Butazolidin Fragmin Pepto-Bismol   Carbenicillin Geminisyn Percodan   Carna Arthritis Reliever Geopen Persantine   Carprofen Gold's salt Persistin   Chloramphenicol Goody's Phenylbutazone   Chloromycetin Haltrain Piroxlcam   Clmetidine heparin Plaquenil   Cllnoril Hyco-pap Ponstel   Clofibrate Hydroxy chloroquine Propoxyphen         Before stopping any of these medications, be sure to consult the physician who ordered them.  Some, such as Coumadin (Warfarin) are ordered to prevent or treat serious conditions such as "deep thrombosis", "pumonary embolisms", and other heart problems.  The amount  of time that you may need off of the medication may also vary with the medication and the reason for which you were taking it.  If you are taking any of these medications, please make sure you notify your pain physician before you undergo any procedures.         Epidural Steroid Injection Patient Information  Description: The epidural space surrounds the nerves as they exit the spinal cord.  In some patients, the nerves can be compressed and inflamed by a bulging disc or a tight spinal canal (spinal stenosis).  By injecting steroids into the epidural space, we can bring irritated nerves into direct contact with a potentially helpful medication.  These steroids act directly on the irritated nerves and can reduce swelling and inflammation which often leads to decreased pain.  Epidural steroids may be injected anywhere along the spine and from the neck to the low back depending upon the location of your pain.   After numbing the skin with local anesthetic (like Novocaine), a small needle is passed into the epidural space slowly.  You may experience a sensation of pressure while this is being done.  The entire block usually last less than 10 minutes.  Conditions which may be treated by  epidural steroids:   Low back and leg pain  Neck and arm pain  Spinal stenosis  Post-laminectomy syndrome  Herpes zoster (shingles) pain  Pain from compression fractures  Preparation for the injection:  1. Do not eat any solid food or dairy products within 8 hours of your appointment.  2. You may drink clear liquids up to 3 hours before appointment.  Clear liquids include water, black coffee, juice or soda.  No milk or cream please. 3. You may take your regular medication, including pain medications, with a sip of water before your appointment  Diabetics should hold regular insulin (if taken separately) and take 1/2 normal NPH dos the morning of the procedure.  Carry some sugar containing items with you to your appointment. 4. A driver must accompany you and be prepared to drive you home after your procedure.  5. Bring all your current medications with your. 6. An IV may be inserted and sedation may be given at the discretion of the physician.   7. A blood pressure cuff, EKG and other monitors will often be applied during the procedure.  Some patients may need to have extra oxygen administered for a short period. 8. You will be asked to provide medical information, including your allergies, prior to the procedure.  We must know immediately if you are taking blood thinners (like Coumadin/Warfarin)  Or if you are allergic to IV iodine contrast (dye). We must know if you could possible be pregnant.  Possible side-effects:  Bleeding from needle site  Infection (rare, may require surgery)  Nerve injury (rare)  Numbness & tingling (temporary)  Difficulty urinating (rare, temporary)  Spinal headache ( a headache worse with upright posture)  Light -headedness (temporary)  Pain at injection site (several days)  Decreased blood pressure (temporary)  Weakness in arm/leg (temporary)  Pressure sensation in back/neck (temporary)  Call if you experience:  Fever/chills associated  with headache or increased back/neck pain.  Headache worsened by an upright position.  New onset weakness or numbness of an extremity below the injection site  Hives or difficulty breathing (go to the emergency room)  Inflammation or drainage at the infection site  Severe back/neck pain  Any new symptoms which are concerning to you  Please note:  Although the local anesthetic injected can often make your back or neck feel good for several hours after the injection, the pain will likely return.  It takes 3-7 days for steroids to work in the epidural space.  You may not notice any pain relief for at least that one week.  If effective, we will often do a series of three injections spaced 3-6 weeks apart to maximally decrease your pain.  After the initial series, we generally will wait several months before considering a repeat injection of the same type.  If you have any questions, please call [phone] [facility]

## 2019-11-13 NOTE — Progress Notes (Signed)
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 at bedtime. , Disp: , Rfl:  .  metFORMIN (GLUCOPHAGE) 500 MG tablet, Take 1,000 mg by mouth 2 (two) times daily with a meal. , Disp: , Rfl:  .  Multiple Vitamins-Minerals (MULTIVITAMIN WITH MINERALS) tablet, Take 1 tablet by mouth daily., Disp: , Rfl:  .  oxyCODONE-acetaminophen (PERCOCET) 10-325 MG tablet, Take 1 tablet by mouth every 4 (four) hours as needed for pain., Disp: 100 tablet, Rfl: 0 .  PROAIR HFA 108 (90 Base) MCG/ACT inhaler, Take 2 puffs by mouth every 6 (six) hours as needed for wheezing or shortness of breath. , Disp: , Rfl: 2 .  quiNINE (QUALAQUIN) 324 MG capsule, Take 1 capsule by mouth every other day. , Disp: , Rfl: 1 .  simvastatin (ZOCOR) 20 MG tablet, Take 20 mg by mouth daily at 6 PM. , Disp: , Rfl:  .  SYMBICORT 160-4.5 MCG/ACT inhaler,  Inhale 1 puff into the lungs 2 (two) times daily., Disp: 1 Inhaler, Rfl: 2  ROS  Constitutional: Denies any fever or chills Gastrointestinal: No reported hemesis, hematochezia, vomiting, or acute GI distress Musculoskeletal: Denies any acute onset joint swelling, redness, loss of ROM, or weakness Neurological: No reported episodes of acute onset apraxia, aphasia, dysarthria, agnosia, amnesia, paralysis, loss of coordination, or loss of consciousness  Allergies  Mr. Eissler has No Known Allergies.  PFSH  Drug: Mr. Belisle  reports no history of drug use. Alcohol:  reports current alcohol use of about 7.0 standard drinks of alcohol per week. Tobacco:  reports that he has been smoking e-cigarettes. He has never used smokeless tobacco. Medical:  has a past medical history of Arthritis, COPD (chronic obstructive pulmonary disease) ([place]), Depression, Diabetes mellitus without complication ([place]), GERD (gastroesophageal reflux disease), History of kidney stones (1990's ), Hypertension, and Sleep apnea (01/30/2016). Surgical: Mr. Prust  has a past surgical history that includes Colonoscopy; Rectal polypectomy (4-5 yrs ago); Anterior cervical decomp/discectomy fusion (N/A, 07/18/2015); Esophagogastroduodenoscopy (egd) with propofol (N/A, 08/03/2019); and Colonoscopy with propofol (N/A, 08/03/2019). Family: family history includes Asthma in his mother; Dementia in his father; Diabetes in his mother; Heart Problems in his father; Hyperlipidemia in his father and mother; Hypertension in his father and mother.  Constitutional Exam  General appearance: Well nourished, well developed, and well hydrated. In no apparent acute distress Vitals:   11/14/19 1300  BP: (!) 122/56  Pulse: 65  Resp: 16  Temp: (!) 97.5 F (36.4 C)  TempSrc: Temporal  SpO2: 95%  Weight: 220 lb (99.8 kg)  Height: '5\' 6"'  (1.676 m)   BMI Assessment: Estimated body mass index is 35.51 kg/m as calculated from the following:   Height  as of this encounter: '5\' 6"'  (1.676 m).   Weight as of this encounter: 220 lb (99.8 kg).  BMI interpretation table: BMI level Category Range association with higher incidence of chronic pain  <18 kg/m2 Underweight   18.5-24.9 kg/m2 Ideal body weight   25-29.9 kg/m2 Overweight Increased incidence by 20%  30-34.9 kg/m2 Obese (Class I) Increased incidence by 68%  35-39.9 kg/m2 Severe obesity (Class II) Increased incidence by 136%  >40 kg/m2 Extreme obesity (Class III) Increased incidence by 254%   Patient's current BMI Ideal Body weight  Body mass index is 35.51 kg/m. Ideal body weight: 63.8 kg (140 lb 10.5 oz) Adjusted ideal body weight: 78.2 kg (172 lb 6.3 oz)   BMI Readings from Last 4 Encounters:  11/14/19 35.51 kg/m  10/10/19 35.51 kg/m  08/03/19 35.83 kg/m  07/11/19 39.06 kg/m   Wt Readings from Last 4 Encounters:  11/14/19 220 lb (99.8 kg)  10/10/19 220 lb (99.8 kg)  08/03/19 222 lb (100.7 kg)  07/11/19 242 lb (109.8 kg)  Psych/Mental status: Alert, oriented x 3 (person, place, & time)       Eyes: PERLA Respiratory: No evidence of acute respiratory distress  Cervical Spine Exam  Skin & Axial Inspection: No masses, redness, edema, swelling, or associated skin lesions Alignment: Symmetrical Functional ROM: Unrestricted ROM      Stability: No instability detected Muscle Tone/Strength: Functionally intact. No obvious neuro-muscular anomalies detected. Sensory (Neurological): Unimpaired Palpation: No palpable anomalies              Upper Extremity (UE) Exam    Side: Right upper extremity  Side: Left upper extremity  Skin & Extremity Inspection: Skin color, temperature, and hair growth are WNL. No peripheral edema or cyanosis. No masses, redness, swelling, asymmetry, or associated skin lesions. No contractures.  Skin & Extremity Inspection: Skin color, temperature, and hair growth are WNL. No peripheral edema or cyanosis. No masses, redness, swelling, asymmetry, or  associated skin lesions. No contractures.  Functional ROM: Unrestricted ROM          Functional ROM: Unrestricted ROM          Muscle Tone/Strength: Functionally intact. No obvious neuro-muscular anomalies detected.   Muscle Tone/Strength: Functionally intact. No obvious neuro-muscular anomalies detected.  Sensory (Neurological): Unimpaired          Sensory (Neurological): Unimpaired          Palpation: No palpable anomalies              Palpation: No palpable anomalies              Provocative Test(s):  Phalen's test: deferred Tinel's test: deferred Apley's scratch test (touch opposite shoulder):  Action 1 (Across chest): deferred Action 2 (Overhead): deferred Action 3 (LB reach): deferred   Provocative Test(s):  Phalen's test: deferred Tinel's test: deferred Apley's scratch test (touch opposite shoulder):  Action 1 (Across chest): deferred Action 2 (Overhead): deferred Action 3 (LB reach): deferred    Thoracic Spine Area Exam  Skin & Axial Inspection: No masses, redness, or swelling Alignment: Symmetrical Functional ROM: Unrestricted ROM Stability: No instability detected Muscle Tone/Strength: Functionally intact. No obvious neuro-muscular anomalies detected. Sensory (Neurological): Unimpaired Muscle strength & Tone: No palpable anomalies  Lumbar Exam  Skin & Axial Inspection: No masses, redness, or swelling Alignment: Symmetrical Functional ROM: Unrestricted ROM       Stability: No instability detected Muscle Tone/Strength: Functionally intact. No obvious neuro-muscular anomalies detected. Sensory (Neurological): Unimpaired Palpation: No palpable anomalies       Provocative Tests: Hyperextension/rotation test: deferred today       Lumbar quadrant test (Kemp's test): deferred today       Lateral bending test: deferred today       Patrick's Maneuver: deferred today                   FABER* test: deferred today                   S-I anterior distraction/compression test:  deferred today         S-I lateral compression test: deferred today         S-I Thigh-thrust test: deferred today         S-I Gaenslen's test: deferred today         *(Flexion, ABduction and External Rotation)  Gait & Posture Assessment  Ambulation: Unassisted Gait: Relatively normal for age and body habitus Posture: WNL   Lower Extremity Exam    Side: Right lower extremity  Side: Left  lower extremity  Stability: No instability observed          Stability: No instability observed          Skin & Extremity Inspection: Skin color, temperature, and hair growth are WNL. No peripheral edema or cyanosis. No masses, redness, swelling, asymmetry, or associated skin lesions. No contractures.  Skin & Extremity Inspection: Skin color, temperature, and hair growth are WNL. No peripheral edema or cyanosis. No masses, redness, swelling, asymmetry, or associated skin lesions. No contractures.  Functional ROM: Unrestricted ROM                  Functional ROM: Unrestricted ROM                  Muscle Tone/Strength: Functionally intact. No obvious neuro-muscular anomalies detected.  Muscle Tone/Strength: Functionally intact. No obvious neuro-muscular anomalies detected.  Sensory (Neurological): Unimpaired        Sensory (Neurological): Unimpaired        DTR: Patellar: deferred today Achilles: deferred today Plantar: deferred today  DTR: Patellar: deferred today Achilles: deferred today Plantar: deferred today  Palpation: No palpable anomalies  Palpation: No palpable anomalies   Assessment & Plan  Primary Diagnosis & Pertinent Problem List: The primary encounter diagnosis was Chronic pain syndrome. 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  Visit Diagnosis: 1. Chronic pain syndrome   2.  Chronic lower extremity pain (1ry area of Pain) (Bilateral) (R>L)   3. Chronic low back pain (2ry area of Pain) (Bilateral) (L>R) w/ sciatica (Bilateral)   4. Failed back surgical syndrome   5. Pharmacologic therapy    Problems updated and reviewed during this visit: No problems updated.  Plan of Care  Pharmacotherapy (Medications Ordered): No orders of the defined types were placed in this encounter.   Procedure Orders     Caudal Epidural Injection Lab Orders  No laboratory test(s) ordered today   Imaging Orders  No imaging studies ordered today   Referral Orders  No referral(s) requested today    Pharmacological management options:  Opioid Analgesics: We'll take over management today. See above orders Membrane stabilizer: Options discussed, including a trial. Muscle relaxant: We have discussed the possibility of a trial NSAID: Trial discussed. Other analgesic(s): To be determined at a later time    Interventional management options: Planned, scheduled, and/or pending:    Diagnostic midline caudal ESI + diagnostic epidurogram #1    Considering:   Diagnostic midline caudal ESI + diagnostic epidurogram #1    PRN Procedures:   None at this time    Provider-requested follow-up: Return for Procedure (w/ sedation): (ML) Caudal ESI #1 + Epidurogram. Recent Visits Date Type Provider Dept  10/10/19 Office Visit Milinda Pointer, MD [facility]  Showing recent visits within past 90 days and meeting all other requirements Today's Visits Date Type Provider Dept  11/14/19 Office Visit Milinda Pointer, MD [facility]  Showing today's visits and meeting all other requirements Future Appointments No visits were found meeting these conditions. Showing future appointments within next 90 days and meeting all other requirements  Primary Care Physician: Sallee Lange, NP Note by: Gaspar Cola, MD Date: 11/14/2019; Time: 1:52 PM

## 2019-11-14 ENCOUNTER — Ambulatory Visit: Payer: Medicare HMO | Attending: Pain Medicine | Admitting: Pain Medicine

## 2019-11-14 ENCOUNTER — Other Ambulatory Visit: Payer: Self-pay

## 2019-11-14 ENCOUNTER — Encounter: Payer: Self-pay | Admitting: Pain Medicine

## 2019-11-14 VITALS — BP 122/56 | HR 65 | Temp 97.5°F | Resp 16 | Ht 66.0 in | Wt 220.0 lb

## 2019-11-14 DIAGNOSIS — M961 Postlaminectomy syndrome, not elsewhere classified: Secondary | ICD-10-CM | POA: Insufficient documentation

## 2019-11-14 DIAGNOSIS — M5441 Lumbago with sciatica, right side: Secondary | ICD-10-CM | POA: Diagnosis present

## 2019-11-14 DIAGNOSIS — M5442 Lumbago with sciatica, left side: Secondary | ICD-10-CM | POA: Insufficient documentation

## 2019-11-14 DIAGNOSIS — M79605 Pain in left leg: Secondary | ICD-10-CM | POA: Insufficient documentation

## 2019-11-14 DIAGNOSIS — Z79899 Other long term (current) drug therapy: Secondary | ICD-10-CM | POA: Insufficient documentation

## 2019-11-14 DIAGNOSIS — M79604 Pain in right leg: Secondary | ICD-10-CM | POA: Insufficient documentation

## 2019-11-14 DIAGNOSIS — G894 Chronic pain syndrome: Secondary | ICD-10-CM | POA: Insufficient documentation

## 2019-11-14 DIAGNOSIS — G8929 Other chronic pain: Secondary | ICD-10-CM | POA: Diagnosis present

## 2019-11-22 ENCOUNTER — Encounter: Payer: Self-pay | Admitting: Pain Medicine

## 2019-11-22 ENCOUNTER — Other Ambulatory Visit: Payer: Self-pay

## 2019-11-22 ENCOUNTER — Ambulatory Visit
Admission: RE | Admit: 2019-11-22 | Discharge: 2019-11-22 | Disposition: A | Discharge status: Home / Self Care | Payer: Medicare HMO | Source: Ambulatory Visit | Attending: Pain Medicine | Admitting: Pain Medicine

## 2019-11-22 ENCOUNTER — Ambulatory Visit (HOSPITAL_BASED_OUTPATIENT_CLINIC_OR_DEPARTMENT_OTHER): Payer: Medicare HMO | Admitting: Pain Medicine

## 2019-11-22 VITALS — BP 114/54 | HR 56 | Temp 97.6°F | Resp 13 | Ht 67.0 in | Wt 220.0 lb

## 2019-11-22 DIAGNOSIS — M5416 Radiculopathy, lumbar region: Secondary | ICD-10-CM | POA: Diagnosis present

## 2019-11-22 DIAGNOSIS — M79605 Pain in left leg: Secondary | ICD-10-CM | POA: Insufficient documentation

## 2019-11-22 DIAGNOSIS — M5136 Other intervertebral disc degeneration, lumbar region: Secondary | ICD-10-CM

## 2019-11-22 DIAGNOSIS — M961 Postlaminectomy syndrome, not elsewhere classified: Secondary | ICD-10-CM

## 2019-11-22 DIAGNOSIS — M79604 Pain in right leg: Secondary | ICD-10-CM | POA: Diagnosis present

## 2019-11-22 DIAGNOSIS — M5441 Lumbago with sciatica, right side: Secondary | ICD-10-CM | POA: Insufficient documentation

## 2019-11-22 DIAGNOSIS — G8929 Other chronic pain: Secondary | ICD-10-CM

## 2019-11-22 DIAGNOSIS — M4316 Spondylolisthesis, lumbar region: Secondary | ICD-10-CM | POA: Insufficient documentation

## 2019-11-22 DIAGNOSIS — M5442 Lumbago with sciatica, left side: Secondary | ICD-10-CM | POA: Diagnosis present

## 2019-11-22 MED ORDER — ROPIVACAINE HCL 2 MG/ML IJ SOLN
2.0000 mL | Freq: Once | INTRAMUSCULAR | Status: AC
  Administered 2019-11-22: 2 mL via EPIDURAL

## 2019-11-22 MED ORDER — MIDAZOLAM HCL 5 MG/5ML IJ SOLN
1.0000 mg | INTRAMUSCULAR | Status: AC | PRN
  Administered 2019-11-22 (×2): 1 mg via INTRAVENOUS
  Filled 2019-11-22: qty 5

## 2019-11-22 MED ORDER — TRIAMCINOLONE ACETONIDE 40 MG/ML IJ SUSP
40.0000 mg | Freq: Once | INTRAMUSCULAR | Status: AC
  Administered 2019-11-22: 40 mg
  Filled 2019-11-22: qty 1

## 2019-11-22 MED ORDER — LIDOCAINE HCL 2 % IJ SOLN
20.0000 mL | Freq: Once | INTRAMUSCULAR | Status: AC
  Administered 2019-11-22: 400 mg
  Filled 2019-11-22: qty 10

## 2019-11-22 MED ORDER — IOHEXOL 180 MG/ML  SOLN
10.0000 mL | Freq: Once | INTRAMUSCULAR | Status: AC
  Administered 2019-11-22: 10 mL via EPIDURAL
  Filled 2019-11-22: qty 20

## 2019-11-22 MED ORDER — FENTANYL CITRATE (PF) 100 MCG/2ML IJ SOLN
25.0000 ug | INTRAMUSCULAR | Status: AC | PRN
  Administered 2019-11-22: 50 ug via INTRAVENOUS
  Filled 2019-11-22: qty 2

## 2019-11-22 MED ORDER — LACTATED RINGERS IV SOLN
1000.0000 mL | Freq: Once | INTRAVENOUS | Status: AC
  Administered 2019-11-22: 1000 mL via INTRAVENOUS

## 2019-11-22 MED ORDER — SODIUM CHLORIDE 0.9% FLUSH
2.0000 mL | Freq: Once | INTRAVENOUS | Status: AC
  Administered 2019-11-22: 2 mL

## 2019-11-22 NOTE — Patient Instructions (Signed)

## 2019-11-22 NOTE — Progress Notes (Signed)
PROVIDER NOTE: Information contained herein reflects review and annotations entered in association with encounter. Interpretation of such information and data should be left to medically-trained personnel. Information provided to patient can be located elsewhere in the medical record under "Patient Instructions". Document created using STT-dictation technology, any transcriptional errors that may result from process are unintentional.    Patient: Darin Waters  Service Category: Procedure  Provider: Oswaldo Done, MD  DOB: 09/30/1960  DOS: 11/22/2019  Location: [facility]  MRN: [id number]  Setting: Ambulatory - outpatient  Referring Provider: Delano Metz, MD  Type: Established Patient  Specialty: Interventional Pain Management  PCP: Myrene Buddy, NP   Primary Reason for Visit: Interventional Pain Management Treatment. CC: Back Pain  Procedure:          Anesthesia, Analgesia, Anxiolysis:  Type: Diagnostic Epidural Steroid Injection + Diagnostic Epidurogram #1  Region: Caudal Level: Sacrococcygeal   Laterality: Midline       Type: Moderate (Conscious) Sedation combined with Local Anesthesia Indication(s): Analgesia and Anxiety Route: Intravenous (IV) IV Access: Secured Sedation: Meaningful verbal contact was maintained at all times during the procedure  Local Anesthetic: Lidocaine 1-2%  Position: Prone   Indications: 1. DDD (degenerative disc disease), lumbar   2. Failed back surgical syndrome   3. Chronic lower extremity pain (1ry area of Pain) (Bilateral) (R>L)   4. Chronic low back pain (2ry area of Pain) (Bilateral) (L>R) w/ sciatica (Bilateral)   5. Spondylolisthesis of lumbar region   6. Lumbar radiculitis    Pain Score: Pre-procedure: 7 /10 Post-procedure: 0-No pain/10   Pre-op Assessment:  Darin Waters is a 59 y.o. (year old), male patient, seen today for interventional treatment. He  has a past surgical history that includes  Colonoscopy; Rectal polypectomy (4-5 yrs ago); Anterior cervical decomp/discectomy fusion (N/A, 07/18/2015); Esophagogastroduodenoscopy (egd) with propofol (N/A, 08/03/2019); and Colonoscopy with propofol (N/A, 08/03/2019). Darin Waters has a current medication list which includes the following prescription(s): amlodipine-benazepril, citalopram, docusate sodium, esomeprazole, trelegy ellipta, furosemide, gabapentin, glimepiride, insulin glargine, metformin, multivitamin with minerals, oxycodone-acetaminophen, proair hfa, quinine, simvastatin, and symbicort. His primarily concern today is the Back Pain  Initial Vital Signs:  Pulse/HCG Rate: (!) 55ECG Heart Rate: (!) 53 Temp: (!) 97.5 F (36.4 C) Resp: 18 BP: 118/61 SpO2: 95 %  BMI: Estimated body mass index is 34.46 kg/m as calculated from the following:   Height as of this encounter: 5\' 7"  (1.702 m).   Weight as of this encounter: 220 lb (99.8 kg).  Risk Assessment: Allergies: Reviewed. He has No Known Allergies.  Allergy Precautions: None required Coagulopathies: Reviewed. None identified.  Blood-thinner therapy: None at this time Active Infection(s): Reviewed. None identified. Darin Waters is afebrile  Site Confirmation: Darin Waters was asked to confirm the procedure and laterality before marking the site Procedure checklist: Completed Consent: Before the procedure and under the influence of no sedative(s), amnesic(s), or anxiolytics, the patient was informed of the treatment options, risks and possible complications. To fulfill our ethical and legal obligations, as recommended by the American Medical Association's Code of Ethics, I have informed the patient of my clinical impression; the nature and purpose of the treatment or procedure; the risks, benefits, and possible complications of the intervention; the alternatives, including doing nothing; the risk(s) and benefit(s) of the alternative treatment(s) or procedure(s); and the risk(s) and  benefit(s) of doing nothing. The patient was provided information about the general risks and possible complications associated with the procedure. These may include, but  are not limited to: failure to achieve desired goals, infection, bleeding, organ or nerve damage, allergic reactions, paralysis, and death. In addition, the patient was informed of those risks and complications associated to Spine-related procedures, such as failure to decrease pain; infection (i.e.: Meningitis, epidural or intraspinal abscess); bleeding (i.e.: epidural hematoma, subarachnoid hemorrhage, or any other type of intraspinal or peri-dural bleeding); organ or nerve damage (i.e.: Any type of peripheral nerve, nerve root, or spinal cord injury) with subsequent damage to sensory, motor, and/or autonomic systems, resulting in permanent pain, numbness, and/or weakness of one or several areas of the body; allergic reactions; (i.e.: anaphylactic reaction); and/or death. Furthermore, the patient was informed of those risks and complications associated with the medications. These include, but are not limited to: allergic reactions (i.e.: anaphylactic or anaphylactoid reaction(s)); adrenal axis suppression; blood sugar elevation that in diabetics may result in ketoacidosis or comma; water retention that in patients with history of congestive heart failure may result in shortness of breath, pulmonary edema, and decompensation with resultant heart failure; weight gain; swelling or edema; medication-induced neural toxicity; particulate matter embolism and blood vessel occlusion with resultant organ, and/or nervous system infarction; and/or aseptic necrosis of one or more joints. Finally, the patient was informed that Medicine is not an exact science; therefore, there is also the possibility of unforeseen or unpredictable risks and/or possible complications that may result in a catastrophic outcome. The patient indicated having understood very  clearly. We have given the patient no guarantees and we have made no promises. Enough time was given to the patient to ask questions, all of which were answered to the patient's satisfaction. Darin Waters has indicated that he wanted to continue with the procedure. Attestation: I, the ordering provider, attest that I have discussed with the patient the benefits, risks, side-effects, alternatives, likelihood of achieving goals, and potential problems during recovery for the procedure that I have provided informed consent. Date  Time: 11/22/2019  9:58 AM  Pre-Procedure Preparation:  Monitoring: As per clinic protocol. Respiration, ETCO2, SpO2, BP, heart rate and rhythm monitor placed and checked for adequate function Safety Precautions: Patient was assessed for positional comfort and pressure points before starting the procedure. Time-out: I initiated and conducted the "Time-out" before starting the procedure, as per protocol. The patient was asked to participate by confirming the accuracy of the "Time Out" information. Verification of the correct person, site, and procedure were performed and confirmed by me, the nursing staff, and the patient. "Time-out" conducted as per Joint Commission's Universal Protocol (UP.01.01.01). Time: 1058  Description of Procedure:          Target Area: Caudal Epidural Canal. Approach: Midline approach. Area Prepped: Entire Posterior Sacrococcygeal Region DuraPrep (Iodine Povacrylex [0.7% available iodine] and Isopropyl Alcohol, 74% w/w) Safety Precautions: Aspiration looking for blood return was conducted prior to all injections. At no point did we inject any substances, as a needle was being advanced. No attempts were made at seeking any paresthesias. Safe injection practices and needle disposal techniques used. Medications properly checked for expiration dates. SDV (single dose vial) medications used. Description of the Procedure: Protocol guidelines were followed. The  patient was placed in position over the fluoroscopy table. The target area was identified and the area prepped in the usual manner. Skin & deeper tissues infiltrated with local anesthetic. Appropriate amount of time allowed to pass for local anesthetics to take effect. The procedure needles were then advanced to the target area. Proper needle placement secured. Negative aspiration confirmed. Solution injected  in intermittent fashion, asking for systemic symptoms every 0.5cc of injectate. The needles were then removed and the area cleansed, making sure to leave some of the prepping solution back to take advantage of its long term bactericidal properties. Vitals:   11/22/19 1115 11/22/19 1118 11/22/19 1125 11/22/19 1135  BP: (!) 134/111 (!) 114/54 (!) 98/53 (!) 114/54  Pulse:      Resp: Temp: 97.8 F (36.6 C)   97.6 F (36.4 C)  TempSrc: Temporal   Temporal  SpO2: 97%  93% 92%  Weight:      Height:        Start Time: 1058 hrs. End Time: 1105 hrs. Materials:  Needle(s) Type: Epidural needle Gauge: 17G Length: 3.5-in Medication(s): Please see orders for medications and dosing details.  Imaging Guidance (Spinal):          Type of Imaging Technique: Fluoroscopy Guidance (Spinal) Indication(s): Assistance in needle guidance and placement for procedures requiring needle placement in or near specific anatomical locations not easily accessible without such assistance. Exposure Time: Please see nurses notes. Contrast: Before injecting any contrast, we confirmed that the patient did not have an allergy to iodine, shellfish, or radiological contrast. Once satisfactory needle placement was completed at the desired level, radiological contrast was injected. Contrast injected under live fluoroscopy. No contrast complications. See chart for type and volume of contrast used. Fluoroscopic Guidance: I was personally present during the use of fluoroscopy. "Tunnel Vision Technique" used to obtain the  best possible view of the target area. Parallax error corrected before commencing the procedure. "Direction-depth-direction" technique used to introduce the needle under continuous pulsed fluoroscopy. Once target was reached, antero-posterior, oblique, and lateral fluoroscopic projection used confirm needle placement in all planes. Images permanently stored in EMR. Interpretation: I personally interpreted the imaging intraoperatively. Adequate needle placement confirmed in multiple planes. Appropriate spread of contrast into desired area was observed. No evidence of afferent or efferent intravascular uptake. No intrathecal or subarachnoid spread observed. Permanent images saved into the patient's record.  Diagnostic Epidurogram:  Contrast: Before injecting any contrast, we confirmed that the patient did not have an allergy to iodine, shellfish, or radiological contrast. For accuracy purposes, contrast was injected under live fluoroscopy. Study personally interpreted intraoparatively. Type: Non-ionic, water soluble, hypoallergenic, myelogram-compatible, radiological contrast used. Please see orders and nurses note for specific choice of contrast. Volume: Please see nurses note for injected volume.  Observations:  Spinal Alignment: Adequate       Vertebral body: Intact Lamina: Intact Disc: Disc hight preserved Facet: Within Normal Limits.        Hardware: None  Spread: Appropriate epidural spread of contrast Anterior: Adequate Posterior: Adequate Superior (cephalad): Adequate Inferior (caudad): Adequate Right lateral: Adequate Left lateral: Adequate Plica medialis dorsalis: Medially aligned Nerve root(s): Adequate  Epidural extravasation: None observed Intrathecal: No intrathecal spread identified Subarachnoid: No subarachnoid spread pattern observed Vascular: No evidence of afferent or efferent intravascular uptake  Impression: Technically successful epidurogram. See above for  details Note: Hard copies saved to EMR.  Antibiotic Prophylaxis:   Anti-infectives (From admission, onward)   None     Indication(s): None identified  Post-operative Assessment:  Post-procedure Vital Signs:  Pulse/HCG Rate: (!) 56 (sb)(!) 53 Temp: 97.6 F (36.4 C) Resp: 13 BP: (!) 114/54 SpO2: 92 %  EBL: None  Complications: No immediate post-treatment complications observed by team, or reported by patient.  Note: The patient tolerated the entire procedure well. A repeat set of vitals were taken  after the procedure and the patient was kept under observation following institutional policy, for this type of procedure. Post-procedural neurological assessment was performed, showing return to baseline, prior to discharge. The patient was provided with post-procedure discharge instructions, including a section on how to identify potential problems. Should any problems arise concerning this procedure, the patient was given instructions to immediately contact us, at any time, without hesitation. In any case, we plan to contact the patient by telephone for a follow-up status report regarding this interventional procedure.  Comments:  No additional relevant information.  Plan of Care  Orders:  Orders Placed This Encounter  Procedures  . Caudal Epidural Injection    Scheduling Instructions:     Laterality: Midline     Level(s): Sacrococcygeal canal (Tailbone area)     Sedation: Patient's choice     Timeframe: Today    Order Specific Question:   Where will this procedure be performed?    Answer:   [facility]  . [facility] C-ARM 1-60 MIN NO REPORT    Intraoperative interpretation by procedural physician at [facility].    Standing Status:   Standing    Number of Occurrences:   1    Order Specific Question:   Reason for exam:    Answer:   Assistance in needle guidance and placement for procedures requiring needle placement in or near specific anatomical  locations not easily accessible without such assistance.  . Informed Consent Details: Physician/Practitioner Attestation; Transcribe to consent form and obtain patient signature    Nursing Order: Transcribe to consent form and obtain patient signature. Note: Always confirm laterality of pain with Mr. Saric, before procedure. Procedure: Caudal epidural steroid injection Indication/Reason: Low back pain and lower extremity pain secondary to lumbosacral radiculitis Provider Attestation: I, Constantinos Krempasky A. Laban Emperor, MD, (Pain Management Specialist), the physician/practitioner, attest that I have discussed with the patient the benefits, risks, side effects, alternatives, likelihood of achieving goals and potential problems during recovery for the procedure that I have provided informed consent.  . Provide equipment / supplies at bedside    Equipment required: Single use, disposable, "Epidural Tray" Epidural Catheter: NOT required    Standing Status:   Standing    Number of Occurrences:   1    Order Specific Question:   Specify    Answer:   Epidural Tray   Chronic Opioid Analgesic:  Hydrocodone/APAP 10/325 1 tablet p.o. twice daily (20 mg/day of hydrocodone) (20 MME/day)  MME/day: 20 mg/day   Medications ordered for procedure: Meds ordered this encounter  Medications  . iohexol (OMNIPAQUE) 180 MG/ML injection 10 mL    Must be Myelogram-compatible. If not available, you may substitute with a water-soluble, non-ionic, hypoallergenic, myelogram-compatible radiological contrast medium.  Marland Kitchen lidocaine (XYLOCAINE) 2 % (with pres) injection 400 mg  . lactated ringers infusion 1,000 mL  . midazolam (VERSED) 5 MG/5ML injection 1-2 mg    Make sure Flumazenil is available in the pyxis when using this medication. If oversedation occurs, administer 0.2 mg IV over 15 sec. If after 45 sec no response, administer 0.2 mg again over 1 min; may repeat at 1 min intervals; not to exceed 4 doses (1 mg)  . fentaNYL  (SUBLIMAZE) injection 25-50 mcg    Make sure Narcan is available in the pyxis when using this medication. In the event of respiratory depression (RR< 8/min): Titrate NARCAN (naloxone) in increments of 0.1 to 0.2 mg IV at 2-3 minute intervals, until desired degree of reversal.  .  sodium chloride flush (NS) 0.9 % injection 2 mL  . ropivacaine (PF) 2 mg/mL (0.2%) (NAROPIN) injection 2 mL  . triamcinolone acetonide (KENALOG-40) injection 40 mg   Medications administered: We administered iohexol, lidocaine, lactated ringers, midazolam, fentaNYL, sodium chloride flush, ropivacaine (PF) 2 mg/mL (0.2%), and triamcinolone acetonide.  See the medical record for exact dosing, route, and time of administration.  Follow-up plan:   Return in about 2 weeks (around 12/06/2019) for (VV), (PP) Follow-up.       Interventional management options: Planned, scheduled, and/or pending:        Considering:      PRN Procedures:   Diagnostic midline caudal ESI + diagnostic epidurogram #2      Recent Visits Date Type Provider Dept  11/22/19 Procedure visit Delano Metz, MD [facility]  11/14/19 Office Visit Delano Metz, MD [facility]  10/10/19 Office Visit Delano Metz, MD [facility]  Showing recent visits within past 90 days and meeting all other requirements Future Appointments Date Type Provider Dept  12/14/19 Appointment Delano Metz, MD [facility]  Showing future appointments within next 90 days and meeting all other requirements  Disposition: Discharge home  Discharge (Date  Time): 11/22/2019; 1136 hrs.   Primary Care Physician: Myrene Buddy, NP Location: [facility] Note by: Oswaldo Done, MD Date: 11/22/2019; Time: 8:12 AM  Disclaimer:  Medicine is not an Visual merchandiser. The only guarantee in medicine is that nothing is guaranteed. It is important to note that the decision to proceed  with this intervention was based on the information collected from the patient. The Data and conclusions were drawn from the patient's questionnaire, the interview, and the physical examination. Because the information was provided in large part by the patient, it cannot be guaranteed that it has not been purposely or unconsciously manipulated. Every effort has been made to obtain as much relevant data as possible for this evaluation. It is important to note that the conclusions that lead to this procedure are derived in large part from the available data. Always take into account that the treatment will also be dependent on availability of resources and existing treatment guidelines, considered by other Pain Management Practitioners as being common knowledge and practice, at the time of the intervention. For Medico-Legal purposes, it is also important to point out that variation in procedural techniques and pharmacological choices are the acceptable norm. The indications, contraindications, technique, and results of the above procedure should only be interpreted and judged by a Board-Certified Interventional Pain Specialist with extensive familiarity and expertise in the same exact procedure and technique.

## 2019-11-22 NOTE — Progress Notes (Signed)
Safety precautions to be maintained throughout the outpatient stay will include: orient to surroundings, keep bed in low position, maintain call bell within reach at all times, provide assistance with transfer out of bed and ambulation.  

## 2019-11-23 ENCOUNTER — Telehealth: Payer: Self-pay | Admitting: *Deleted

## 2019-11-23 NOTE — Telephone Encounter (Signed)
No problems post procedure. 

## 2019-12-13 ENCOUNTER — Telehealth: Payer: Self-pay

## 2019-12-13 NOTE — Telephone Encounter (Signed)
Left message with patients wife to have patient call us back today to go over virtual appointment questions.

## 2019-12-13 NOTE — Progress Notes (Signed)
Patient: Darin Waters  Service Category: E/M  Provider: Gaspar Cola, MD  DOB: 14-Oct-1960  DOS: 12/14/2019  Location: Office  MRN: [id number]  Setting: Ambulatory outpatient  Referring Provider: Sallee Lange, *  Type: Established Patient  Specialty: Interventional Pain Management  PCP: Sallee Lange, NP  Location: Remote location  Delivery: TeleHealth     Virtual Encounter - Pain Management PROVIDER NOTE: Information contained herein reflects review and annotations entered in association with encounter. Interpretation of such information and data should be left to medically-trained personnel. Information provided to patient can be located elsewhere in the medical record under "Patient Instructions". Document created using STT-dictation technology, any transcriptional errors that may result from process are unintentional.    Contact & Pharmacy Preferred: [phone] Home: [phone] (home) Mobile: There is no such number on file (mobile). E-mail: [email or web address]  [id number] [address]Phone: [phone]: [phone]  Pre-screening  Mr. BWaterworthoffered "in-person" vs "virtual" encounter. He indicated preferring virtual for this encounter.   Reason COVID-19*  Social distancing based on CDC and AMA recommendations.   I contacted DChaston BradburnBarrett on 12/14/2019 via telephone.      I clearly identified myself as FGaspar Cola MD. I verified that I was speaking with the correct person using two identifiers (Name: DZUBAYR BEDNARCZYK and date of birth: 1May 05, 1962.  Consent I sought verbal advanced consent from DMadisonfor virtual visit interactions. I informed Mr. BSirmonof possible security and privacy concerns, risks, and limitations associated with providing "not-in-person" medical evaluation and management services. I also informed Mr. BMonjarazof the availability  of "in-person" appointments. Finally, I informed him that there would be a charge for the virtual visit and that he could be  personally, fully or partially, financially responsible for it. Mr. BDemartinexpressed understanding and agreed to proceed.   Historic Elements   Mr. DFINLEE MILOis a 59y.o. year old, male patient evaluated today after our last contact on 11/22/2019. Mr. BStankey has a past medical history of Arthritis, COPD (chronic obstructive pulmonary disease) ([place], Depression, Diabetes mellitus without complication ([place], GERD (gastroesophageal reflux disease), History of kidney stones (1990's ), Hypertension, and Sleep apnea (01/30/2016). He also  has a past surgical history that includes Colonoscopy; Rectal polypectomy (4-5 yrs ago); Anterior cervical decomp/discectomy fusion (N/A, 07/18/2015); Esophagogastroduodenoscopy (egd) with propofol (N/A, 08/03/2019); and Colonoscopy with propofol (N/A, 08/03/2019). Mr. BGallicchiohas a current medication list which includes the following prescription(s): amlodipine-benazepril, citalopram, docusate sodium, esomeprazole, trelegy ellipta, furosemide, gabapentin, glimepiride, insulin glargine, metformin, multivitamin with minerals, oxycodone-acetaminophen, proair hfa, quinine, simvastatin, and symbicort. He  reports that he has been smoking e-cigarettes. He has never used smokeless tobacco. He reports current alcohol use of about 7.0 standard drinks of alcohol per week. He reports that he does not use drugs. Mr. BKyhas No Known Allergies.   HPI  Today, he is being contacted for a post-procedure assessment.  The patient indicates that the caudal epidural steroid injection is the best thing that he has had in a long time.  He was completely pain-free for approximately 2 weeks.  The pain has been coming back but he states that it is nothing like it was before he saw me.  At this point we talked about some options and we have decided to repeat this injection  and compare to see if he gets longer lasting  benefit with the second 1 thinking that a series of them may be beneficial.  However, if it does not provide him with any longer benefit, then we will consider doing a Racz procedure. I explained the process to the patient and he indicated being very much interested in it.  We will start by scheduling a repeat injection.  Post-Procedure Evaluation  Procedure (11/22/2019): Diagnostic midline caudal epidural steroid injection #1 + diagnostic epidurogram under fluoroscopic guidance and IV sedation Pre-procedure pain level: 7/10 Post-procedure: 0/10 (100% relief)  Sedation: Sedation provided.  Effectiveness during initial hour after procedure(Ultra-Short Term Relief):   100%.  Local anesthetic used: Long-acting (4-6 hours) Effectiveness: Defined as any analgesic benefit obtained secondary to the administration of local anesthetics. This carries significant diagnostic value as to the etiological location, or anatomical origin, of the pain. Duration of benefit is expected to coincide with the duration of the local anesthetic used.  Effectiveness during initial 4-6 hours after procedure(Short-Term Relief):   100%.  Long-term benefit: Defined as any relief past the pharmacologic duration of the local anesthetics.  Effectiveness past the initial 6 hours after procedure(Long-Term Relief):   100% x 2 wks.  Current benefits: Defined as benefit that persist at this time.   Analgesia:  50% improved Function: Somewhat improved ROM: Somewhat improved  Pharmacotherapy Assessment  Analgesic: Hydrocodone/APAP 10/325 1 tablet p.o. twice daily (20 mg/day of hydrocodone) (20 MME/day)  MME/day: 20 mg/day   Monitoring: [facility] PMP: PDMP reviewed during this encounter.       Pharmacotherapy: No side-effects or adverse reactions reported. Compliance: No problems identified. Effectiveness: Clinically acceptable. Plan: Refer to "POC".  UDS:  Summary  Date Value Ref Range  Status  10/10/2019 Note  Final    Comment:    ==================================================================== Compliance Drug Analysis, Ur ==================================================================== Test                             Result       Flag       Units  Drug Present and Declared for Prescription Verification   Gabapentin                     PRESENT      EXPECTED   Citalopram                     PRESENT      EXPECTED   Desmethylcitalopram            PRESENT      EXPECTED    Desmethylcitalopram is an expected metabolite of citalopram or the    enantiomeric form, escitalopram.    Acetaminophen                  PRESENT      EXPECTED  Drug Present not Declared for Prescription Verification   Hydrocodone                    2963         UNEXPECTED ng/mg creat   Hydromorphone                  135          UNEXPECTED ng/mg creat   Dihydrocodeine                 393          UNEXPECTED ng/mg creat   Norhydrocodone  1000         UNEXPECTED ng/mg creat    Sources of hydrocodone include scheduled prescription medications.    Hydromorphone, dihydrocodeine and norhydrocodone are expected    metabolites of hydrocodone. Hydromorphone and dihydrocodeine are    also available as scheduled prescription medications.    Butorphanol                    PRESENT      UNEXPECTED  Drug Absent but Declared for Prescription Verification   Oxycodone                      Not Detected UNEXPECTED ng/mg creat   Cyclobenzaprine                Not Detected UNEXPECTED   Salicylate                     Not Detected UNEXPECTED    Aspirin, as indicated in the declared medication list, is not always    detected even when used as directed.  ==================================================================== Test                      Result    Flag   Units      Ref Range   Creatinine              185              mg/dL       >=20 ==================================================================== Declared Medications:  The flagging and interpretation on this report are based on the  following declared medications.  Unexpected results may arise from  inaccuracies in the declared medications.   **Note: The testing scope of this panel includes these medications:   Citalopram (Celexa)  Cyclobenzaprine (Flexeril)  Gabapentin (Neurontin)  Oxycodone (Percocet)   **Note: The testing scope of this panel does not include small to  moderate amounts of these reported medications:   Acetaminophen (Percocet)  Aspirin   **Note: The testing scope of this panel does not include the  following reported medications:   Albuterol (Proair HFA)  Amlodipine (Lotrel)  Benazepril (Lotrel)  Budesonide (Symbicort)  Docusate (Colace)  Esomeprazole (Nexium)  Fluticasone (Trelegy)  Formoterol (Symbicort)  Furosemide (Lasix)  Glimepiride (Amaryl)  Insulin  Metformin (Glucophage)  Multivitamin  Quinine (Qualaquin)  Simvastatin (Zocor)  Umeclidinium (Trelegy)  Vilanterol (Trelegy) ==================================================================== For clinical consultation, please call [phone]. ====================================================================     Laboratory Chemistry Profile   Renal Lab Results  Component Value Date   BUN 20 10/10/2019   CREATININE 1.43 (H) 10/10/2019   BCR 14 10/10/2019   GFRAA 61 10/10/2019   GFRNONAA 53 (L) 10/10/2019     Hepatic Lab Results  Component Value Date   AST 30 10/10/2019   ALBUMIN 4.6 10/10/2019   ALKPHOS 59 10/10/2019     Electrolytes Lab Results  Component Value Date   NA 136 10/10/2019   K 5.7 (H) 10/10/2019   CL 100 10/10/2019   CALCIUM 9.8 10/10/2019   MG 2.0 10/10/2019     Bone Lab Results  Component Value Date   25OHVITD1 33 10/10/2019   25OHVITD2 <1.0 10/10/2019   25OHVITD3 33 10/10/2019     Inflammation (CRP: Acute  Phase) (ESR: Chronic Phase) Lab Results  Component Value Date   CRP 2 10/10/2019   ESRSEDRATE 16 10/10/2019       Note: Above Lab results reviewed.  Imaging  DG PAIN  CLINIC C-ARM 1-60 MIN NO REPORT Fluoro was used, but no Radiologist interpretation will be provided.  Please refer to "NOTES" tab for provider progress note.  Assessment  The primary encounter diagnosis was DDD (degenerative disc disease), lumbar. 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  Plan of Care  Problem-specific:  No problem-specific Assessment & Plan notes found for this encounter.  Mr. ALBEN JEPSEN has a current medication list which includes the following long-term medication(s): amlodipine-benazepril, citalopram, esomeprazole, furosemide, gabapentin, glimepiride, insulin glargine, metformin, proair hfa, simvastatin, and symbicort.  Pharmacotherapy (Medications Ordered): No orders of the defined types were placed in this encounter.  Orders:  Orders Placed This Encounter  Procedures  . Caudal Epidural Injection    Standing Status:   Future    Standing Expiration Date:   01/13/2020    Scheduling Instructions:     Laterality: Midline     Level(s): Sacrococcygeal canal (Tailbone area)     Sedation: Patient's choice     Scheduling Timeframe: As soon as pre-approved    Order Specific Question:   Where will this procedure be performed?    Answer:   [facility]   Follow-up plan:   Return for Procedure (w/ sedation): (ML) Caudal ESI #2.      Interventional management options: Planned, scheduled, and/or pending:        Considering:      PRN Procedures:   Diagnostic midline caudal ESI + diagnostic epidurogram #2       Recent  Visits Date Type Provider Dept  11/22/19 Procedure visit Milinda Pointer, MD [facility]  11/14/19 Office Visit Milinda Pointer, MD [facility]  10/10/19 Office Visit Milinda Pointer, MD [facility]  Showing recent visits within past 90 days and meeting all other requirements Today's Visits Date Type Provider Dept  12/14/19 Telemedicine Milinda Pointer, MD [facility]  Showing today's visits and meeting all other requirements Future Appointments No visits were found meeting these conditions. Showing future appointments within next 90 days and meeting all other requirements  I discussed the assessment and treatment plan with the patient. The patient was provided an opportunity to ask questions and all were answered. The patient agreed with the plan and demonstrated an understanding of the instructions.  Patient advised to call back or seek an in-person evaluation if the symptoms or condition worsens.  Duration of encounter: 13 minutes.  Note by: Gaspar Cola, MD Date: 12/14/2019; Time: 5:30 PM

## 2019-12-14 ENCOUNTER — Ambulatory Visit: Payer: Medicare HMO | Attending: Pain Medicine | Admitting: Pain Medicine

## 2019-12-14 ENCOUNTER — Other Ambulatory Visit: Payer: Self-pay

## 2019-12-14 DIAGNOSIS — M5441 Lumbago with sciatica, right side: Secondary | ICD-10-CM

## 2019-12-14 DIAGNOSIS — M5136 Other intervertebral disc degeneration, lumbar region: Secondary | ICD-10-CM

## 2019-12-14 DIAGNOSIS — G894 Chronic pain syndrome: Secondary | ICD-10-CM

## 2019-12-14 DIAGNOSIS — M961 Postlaminectomy syndrome, not elsewhere classified: Secondary | ICD-10-CM

## 2019-12-14 DIAGNOSIS — M79604 Pain in right leg: Secondary | ICD-10-CM

## 2019-12-14 DIAGNOSIS — M5416 Radiculopathy, lumbar region: Secondary | ICD-10-CM

## 2019-12-14 DIAGNOSIS — M5442 Lumbago with sciatica, left side: Secondary | ICD-10-CM | POA: Diagnosis not present

## 2019-12-14 DIAGNOSIS — M4316 Spondylolisthesis, lumbar region: Secondary | ICD-10-CM

## 2019-12-14 DIAGNOSIS — G8929 Other chronic pain: Secondary | ICD-10-CM

## 2019-12-14 DIAGNOSIS — M79605 Pain in left leg: Secondary | ICD-10-CM

## 2019-12-14 NOTE — Patient Instructions (Signed)
____________________________________________________________________________________________  Preparing for Procedure with Sedation  Procedure appointments are limited to planned procedures: . No Prescription Refills. . No disability issues will be discussed. . No medication changes will be discussed.  Instructions: . Oral Intake: Do not eat or drink anything for at least 8 hours prior to your procedure. (Exception: Blood Pressure Medication. See below.) . Transportation: Unless otherwise stated by your physician, you may drive yourself after the procedure. . Blood Pressure Medicine: Do not forget to take your blood pressure medicine with a sip of water the morning of the procedure. If your Diastolic (lower reading)is above 100 mmHg, elective cases will be cancelled/rescheduled. . Blood thinners: These will need to be stopped for procedures. Notify our staff if you are taking any blood thinners. Depending on which one you take, there will be specific instructions on how and when to stop it. . Diabetics on insulin: Notify the staff so that you can be scheduled 1st case in the morning. If your diabetes requires high dose insulin, take only  of your normal insulin dose the morning of the procedure and notify the staff that you have done so. . Preventing infections: Shower with an antibacterial soap the morning of your procedure. . Build-up your immune system: Take 1000 mg of Vitamin C with every meal (3 times a day) the day prior to your procedure. . Antibiotics: Inform the staff if you have a condition or reason that requires you to take antibiotics before dental procedures. . Pregnancy: If you are pregnant, call and cancel the procedure. . Sickness: If you have a cold, fever, or any active infections, call and cancel the procedure. . Arrival: You must be in the facility at least 30 minutes prior to your scheduled procedure. . Children: Do not bring children with you. . Dress appropriately:  Bring dark clothing that you would not mind if they get stained. . Valuables: Do not bring any jewelry or valuables.  Reasons to call and reschedule or cancel your procedure: (Following these recommendations will minimize the risk of a serious complication.) . Surgeries: Avoid having procedures within 2 weeks of any surgery. (Avoid for 2 weeks before or after any surgery). . Flu Shots: Avoid having procedures within 2 weeks of a flu shots or . (Avoid for 2 weeks before or after immunizations). . Barium: Avoid having a procedure within 7-10 days after having had a radiological study involving the use of radiological contrast. (Myelograms, Barium swallow or enema study). . Heart attacks: Avoid any elective procedures or surgeries for the initial 6 months after a "Myocardial Infarction" (Heart Attack). . Blood thinners: It is imperative that you stop these medications before procedures. Let us know if you if you take any blood thinner.  . Infection: Avoid procedures during or within two weeks of an infection (including chest colds or gastrointestinal problems). Symptoms associated with infections include: Localized redness, fever, chills, night sweats or profuse sweating, burning sensation when voiding, cough, congestion, stuffiness, runny nose, sore throat, diarrhea, nausea, vomiting, cold or Flu symptoms, recent or current infections. It is specially important if the infection is over the area that we intend to treat. . Heart and lung problems: Symptoms that may suggest an active cardiopulmonary problem include: cough, chest pain, breathing difficulties or shortness of breath, dizziness, ankle swelling, uncontrolled high or unusually low blood pressure, and/or palpitations. If you are experiencing any of these symptoms, cancel your procedure and contact your primary care physician for an evaluation.  Remember:  Regular Business hours are:    Monday to Thursday 8:00 AM to 4:00 PM  Provider's  Schedule: Lylith Bebeau, MD:  Procedure days: Tuesday and Thursday 7:30 AM to 4:00 PM  Bilal Lateef, MD:  Procedure days: Monday and Wednesday 7:30 AM to 4:00 PM ____________________________________________________________________________________________    

## 2019-12-19 ENCOUNTER — Encounter (INDEPENDENT_AMBULATORY_CARE_PROVIDER_SITE_OTHER): Payer: Self-pay | Admitting: Vascular Surgery

## 2019-12-19 ENCOUNTER — Ambulatory Visit (INDEPENDENT_AMBULATORY_CARE_PROVIDER_SITE_OTHER): Payer: Medicare HMO | Admitting: Vascular Surgery

## 2019-12-19 ENCOUNTER — Other Ambulatory Visit: Payer: Self-pay

## 2019-12-19 VITALS — BP 118/69 | HR 60 | Ht 66.0 in | Wt 221.0 lb

## 2019-12-19 DIAGNOSIS — M79605 Pain in left leg: Secondary | ICD-10-CM

## 2019-12-19 DIAGNOSIS — M79604 Pain in right leg: Secondary | ICD-10-CM | POA: Diagnosis not present

## 2019-12-19 DIAGNOSIS — I2510 Atherosclerotic heart disease of native coronary artery without angina pectoris: Secondary | ICD-10-CM

## 2019-12-19 DIAGNOSIS — I739 Peripheral vascular disease, unspecified: Secondary | ICD-10-CM

## 2019-12-19 DIAGNOSIS — I10 Essential (primary) hypertension: Secondary | ICD-10-CM

## 2019-12-19 DIAGNOSIS — M5136 Other intervertebral disc degeneration, lumbar region: Secondary | ICD-10-CM | POA: Diagnosis not present

## 2019-12-19 DIAGNOSIS — J449 Chronic obstructive pulmonary disease, unspecified: Secondary | ICD-10-CM

## 2019-12-19 DIAGNOSIS — I2583 Coronary atherosclerosis due to lipid rich plaque: Secondary | ICD-10-CM

## 2019-12-21 ENCOUNTER — Encounter (INDEPENDENT_AMBULATORY_CARE_PROVIDER_SITE_OTHER): Payer: Self-pay | Admitting: Vascular Surgery

## 2019-12-21 DIAGNOSIS — I739 Peripheral vascular disease, unspecified: Secondary | ICD-10-CM | POA: Insufficient documentation

## 2019-12-21 NOTE — Progress Notes (Signed)
MRN : [id number]  Darin Waters is a 59 y.o. (03/27/1961) male who presents with chief complaint of  Chief Complaint  Patient presents with  . New Patient (Initial Visit)    BLE Pain  .  History of Present Illness:   The patient is seen for evaluation of painful lower extremities. Patient notes the pain is variable and not always associated with activity.  The pain is somewhat consistent day to day occurring on most days. The patient notes the pain also occurs with standing and routinely seems worse as the day wears on. The pain has been progressive over the past several years. The patient states these symptoms are causing  a profound negative impact on quality of life and daily activities.  The patient denies rest pain or dangling of an extremity off the side of the bed during the night for relief. No open wounds or sores at this time. No history of DVT or phlebitis. No prior interventions or surgeries.  There is an extensive history of back problems and DJD of the lumbar and sacral spine.  He is s/p multiple spine surgeries   Current Meds  Medication Sig  . amLODipine-benazepril (LOTREL) 5-20 MG capsule Take 1 capsule by mouth daily before breakfast.   . citalopram (CELEXA) 10 MG tablet Take 10 mg by mouth daily before breakfast.   . docusate sodium (COLACE) 100 MG capsule Take 1 capsule (100 mg total) by mouth 2 (two) times daily.  Marland Kitchen. esomeprazole (NEXIUM) 20 MG capsule Take 20 mg by mouth daily before breakfast.  . Fluticasone-Umeclidin-Vilant (TRELEGY ELLIPTA) 100-62.5-25 MCG/INH AEPB Inhale 1 puff into the lungs daily.  . furosemide (LASIX) 20 MG tablet Take 20 mg by mouth daily.  Marland Kitchen. gabapentin (NEURONTIN) 300 MG capsule Take 1 capsule by mouth 2 (two) times daily.  Marland Kitchen. glimepiride (AMARYL) 2 MG tablet Take 2 mg by mouth daily with breakfast.  . HYDROcodone-acetaminophen (NORCO) 10-325 MG tablet Take 0.5-1 tablet by mouth every 12 hours as needed for pain  . Insulin Glargine  (LANTUS SOLOSTAR) 100 UNIT/ML Solostar Pen Inject 34 Units into the skin at bedtime.   . metFORMIN (GLUCOPHAGE) 500 MG tablet Take 1,000 mg by mouth 2 (two) times daily with a meal.   . Multiple Vitamins-Minerals (MULTIVITAMIN WITH MINERALS) tablet Take 1 tablet by mouth daily.  . nabumetone (RELAFEN) 750 MG tablet Take 750 mg by mouth 2 (two) times daily.  Marland Kitchen. oxyCODONE-acetaminophen (PERCOCET) 10-325 MG tablet Take 1 tablet by mouth every 4 (four) hours as needed for pain.  Marland Kitchen. PROAIR HFA 108 (90 Base) MCG/ACT inhaler Take 2 puffs by mouth every 6 (six) hours as needed for wheezing or shortness of breath.   . quiNINE (QUALAQUIN) 324 MG capsule Take 1 capsule by mouth every other day.   . simvastatin (ZOCOR) 20 MG tablet Take 20 mg by mouth daily at 6 PM.   . SYMBICORT 160-4.5 MCG/ACT inhaler Inhale 1 puff into the lungs 2 (two) times daily.    Past Medical History:  Diagnosis Date  . Arthritis    DDD- lumbar , thumbs - both hands   . COPD (chronic obstructive pulmonary disease) (HCC)   . Depression   . Diabetes mellitus without complication (HCC)   . GERD (gastroesophageal reflux disease)   . History of kidney stones 1990's    lithotripsy done   . Hypertension   . Sleep apnea 01/30/2016   [facility] [place], results not avail to pt. yet  Past Surgical History:  Procedure Laterality Date  . ANTERIOR CERVICAL DECOMP/DISCECTOMY FUSION N/A 07/18/2015   Procedure: CERVICALFIVE-SIX, CERVICAL SIX-SEVEN ANTERIOR CERVICAL DECOMPRESSION/DISCECTOMY FUSION;  Surgeon: Tressie Stalker, MD;  Location: [facility];  Service: Neurosurgery;  Laterality: N/A;  C56 C67 anterior cervical decompression with fusion interbody prosthesis plating and bonegraft  . COLONOSCOPY    . COLONOSCOPY WITH PROPOFOL N/A 08/03/2019   Procedure: COLONOSCOPY WITH PROPOFOL;  Surgeon: Toledo, Boykin Nearing, MD;  Location: [facility];  Service: Gastroenterology;  Laterality: N/A;  . ESOPHAGOGASTRODUODENOSCOPY (EGD)  WITH PROPOFOL N/A 08/03/2019   Procedure: ESOPHAGOGASTRODUODENOSCOPY (EGD) WITH PROPOFOL;  Surgeon: Toledo, Boykin Nearing, MD;  Location: [facility];  Service: Gastroenterology;  Laterality: N/A;  . RECTAL POLYPECTOMY  4-5 yrs ago    Social History Social History   Tobacco Use  . Smoking status: Current Every Day Smoker    Types: E-cigarettes    Last attempt to quit: 05/2016    Years since quitting: 3.6  . Smokeless tobacco: Never Used  Vaping Use  . Vaping Use: Every day  . Substances: Nicotine  Substance Use Topics  . Alcohol use: Yes    Alcohol/week: 7.0 standard drinks    Types: 7 Cans of beer per week    Comment: daily   . Drug use: No    Family History Family History  Problem Relation Age of Onset  . Diabetes Mother   . Asthma Mother   . Hypertension Mother   . Hyperlipidemia Mother   . Hyperlipidemia Father   . Dementia Father   . Hypertension Father   . Heart Problems Father        pace maker  No family history of bleeding/clotting disorders, porphyria or autoimmune disease   No Known Allergies   REVIEW OF SYSTEMS (Negative unless checked)  Constitutional: [] Weight loss  [] Fever  [] Chills Cardiac: [] Chest pain   [] Chest pressure   [] Palpitations   [] Shortness of breath when laying flat   [] Shortness of breath with exertion. Vascular:  [x] Pain in legs with walking   [x] Pain in legs at rest  [] History of DVT   [] Phlebitis   [] Swelling in legs   [] Varicose veins   [] Non-healing ulcers Pulmonary:   [] Uses home oxygen   [] Productive cough   [] Hemoptysis   [] Wheeze  [] COPD   [] Asthma Neurologic:  [] Dizziness   [] Seizures   [] History of stroke   [] History of TIA  [] Aphasia   [] Vissual changes   [] Weakness or numbness in arm   [] Weakness or numbness in leg Musculoskeletal:   [] Joint swelling   [x] Joint pain   [x] Low back pain Hematologic:  [] Easy bruising  [] Easy bleeding   [] Hypercoagulable state   [] Anemic Gastrointestinal:  [] Diarrhea   [] Vomiting   [] Gastroesophageal reflux/heartburn   [] Difficulty swallowing. Genitourinary:  [] Chronic kidney disease   [] Difficult urination  [] Frequent urination   [] Blood in urine Skin:  [] Rashes   [] Ulcers  Psychological:  [] History of anxiety   []  History of major depression.  Physical Examination  Vitals:   12/19/19 1527  BP: 118/69  Pulse: 60  Weight: 221 lb (100.2 kg)  Height: 5\' 6"  (1.676 m)   Body mass index is 35.67 kg/m. Gen: WD/WN, NAD Head: [place]/AT, No temporalis wasting.  Ear/Nose/Throat: Hearing grossly intact, nares w/o erythema or drainage, poor dentition Eyes: PER, EOMI, sclera nonicteric.  Neck: Supple, no masses.  No bruit or JVD.  Pulmonary:  Good air movement, clear to auscultation bilaterally, no use of accessory muscles.  Cardiac: RRR, normal  S1, S2, no Murmurs. Vascular:  Vessel Right Left  Radial Palpable Palpable  PT Palpable Not Palpable  DP Palpable Not Palpable  Gastrointestinal: soft, non-distended. No guarding/no peritoneal signs.  Musculoskeletal: M/S 5/5 throughout.  No deformity or atrophy.  Neurologic: CN 2-12 intact. Pain and light touch intact in extremities.  Symmetrical.  Speech is fluent. Motor exam as listed above. Psychiatric: Judgment intact, Mood & affect appropriate for pt's clinical situation. Dermatologic: No rashes or ulcers noted.  No changes consistent with cellulitis.   CBC Lab Results  Component Value Date   WBC 20.2 (H) 02/14/2016   HGB 14.0 02/14/2016   HCT 40.8 02/14/2016   MCV 96.7 02/14/2016   PLT 219 02/14/2016    BMET    Component Value Date/Time   NA 136 10/10/2019 1642   NA 140 04/11/2011 0738   K 5.7 (H) 10/10/2019 1642   K 3.7 04/11/2011 0738   CL 100 10/10/2019 1642   CL 105 04/11/2011 0738   CO2 26 02/14/2016 0220   CO2 28 04/11/2011 0738   GLUCOSE 165 (H) 10/10/2019 1642   GLUCOSE 212 (H) 02/14/2016 0220   GLUCOSE 155 (H) 04/11/2011 0738   BUN 20 10/10/2019 1642   BUN 14 04/11/2011 0738   CREATININE 1.43  (H) 10/10/2019 1642   CREATININE 0.85 04/11/2011 0738   CALCIUM 9.8 10/10/2019 1642   CALCIUM 9.3 04/11/2011 0738   GFRNONAA 53 (L) 10/10/2019 1642   GFRNONAA >60 04/11/2011 0738   GFRAA 61 10/10/2019 1642   GFRAA >60 04/11/2011 0738   CrCl cannot be calculated (Patient's most recent lab result is older than the maximum 21 days allowed.).  COAG No results found for: INR, PROTIME  Radiology DG PAIN CLINIC C-ARM 1-60 MIN NO REPORT  Result Date: 11/22/2019 Fluoro was used, but no Radiologist interpretation will be provided. Please refer to "NOTES" tab for provider progress note.     Assessment/Plan 1. Pain in both lower extremities  Recommend:  The patient has atypical pain symptoms for pure atherosclerotic disease. However, on physical exam there is evidence of mixed venous and arterial disease, given the diminished pulses of the left leg.  Noninvasive studies including ABI's and left leg arterial ultrasound of the legs will be obtained and the patient will follow up with me to review these studies.  I suspect the patient is c/o pseudoclaudication.  Patient should have further evaluation of his LS spine which I defer to the primary service.  The patient should continue walking and begin a more formal exercise program. The patient should continue his antiplatelet therapy and aggressive treatment of the lipid abnormalities.  - VAS [place] LOWER EXTREMITY ARTERIAL DUPLEX; Future - VAS [place] ABI WITH/WO TBI; Future  2. PAD (peripheral artery disease) (HCC)  Recommend:  The patient has atypical pain symptoms for pure atherosclerotic disease. However, on physical exam there is evidence of mixed venous and arterial disease, given the diminished pulses of the left leg.  Noninvasive studies including ABI's and left leg arterial ultrasound of the legs will be obtained and the patient will follow up with me to review these studies.  I suspect the patient is c/o pseudoclaudication.  Patient  should have further evaluation of his LS spine which I defer to the primary service.  The patient should continue walking and begin a more formal exercise program. The patient should continue his antiplatelet therapy and aggressive treatment of the lipid abnormalities.  - VAS [place] LOWER EXTREMITY ARTERIAL DUPLEX; Future - VAS [place]  ABI WITH/WO TBI; Future  3. DDD (degenerative disc disease), lumbar Continue NSAID medications as already ordered, these medications have been reviewed and there are no changes at this time.  Continued activity and therapy was stressed.   4. Hypertension, unspecified type Continue antihypertensive medications as already ordered, these medications have been reviewed and there are no changes at this time.   5. Coronary artery disease due to lipid rich plaque Continue cardiac and antihypertensive medications as already ordered and reviewed, no changes at this time.  Continue statin as ordered and reviewed, no changes at this time  Nitrates PRN for chest pain   6. Obstructive chronic bronchitis without exacerbation (HCC) Continue pulmonary medications and aerosols as already ordered, these medications have been reviewed and there are no changes at this time.      Levora Dredge, MD  12/21/2019 11:57 AM

## 2020-01-09 ENCOUNTER — Encounter: Payer: Self-pay | Admitting: Internal Medicine

## 2020-01-09 ENCOUNTER — Other Ambulatory Visit: Payer: Self-pay

## 2020-01-09 ENCOUNTER — Ambulatory Visit: Payer: Medicare HMO | Admitting: Internal Medicine

## 2020-01-09 VITALS — BP 96/56 | HR 59 | Temp 98.5°F | Resp 16 | Ht 66.0 in | Wt 223.8 lb

## 2020-01-09 DIAGNOSIS — R0602 Shortness of breath: Secondary | ICD-10-CM | POA: Diagnosis not present

## 2020-01-09 DIAGNOSIS — J449 Chronic obstructive pulmonary disease, unspecified: Secondary | ICD-10-CM

## 2020-01-09 DIAGNOSIS — F17201 Nicotine dependence, unspecified, in remission: Secondary | ICD-10-CM

## 2020-01-09 DIAGNOSIS — I10 Essential (primary) hypertension: Secondary | ICD-10-CM

## 2020-01-09 NOTE — Progress Notes (Signed)
[facility] [address] [place], [place] [address]  Pulmonary Sleep Medicine   Office Visit Note  Patient Name: Darin Waters DOB: 1961-03-03 MRN [id number]  Date of Service: 01/09/2020  Complaints/HPI: Pt is here for pulm follow up. He has a history of copd, and HTN.  He reports overall he has been doing well.  He received a reminder in the mail to have a Low dose Ct chest done due to his smoking history.   ROS  General: (-) fever, (-) chills, (-) night sweats, (-) weakness Skin: (-) rashes, (-) itching,. Eyes: (-) visual changes, (-) redness, (-) itching. Nose and Sinuses: (-) nasal stuffiness or itchiness, (-) postnasal drip, (-) nosebleeds, (-) sinus trouble. Mouth and Throat: (-) sore throat, (-) hoarseness. Neck: (-) swollen glands, (-) enlarged thyroid, (-) neck pain. Respiratory: - cough, (-) bloody sputum, + shortness of breath, - wheezing. Cardiovascular: - ankle swelling, (-) chest pain. Lymphatic: (-) lymph node enlargement. Neurologic: (-) numbness, (-) tingling. Psychiatric: (-) anxiety, (-) depression   Current Medication: Outpatient Encounter Medications as of 01/09/2020  Medication Sig  . amLODipine-benazepril (LOTREL) 5-20 MG capsule Take 1 capsule by mouth daily before breakfast.   . citalopram (CELEXA) 10 MG tablet Take 10 mg by mouth daily before breakfast.   . docusate sodium (COLACE) 100 MG capsule Take 1 capsule (100 mg total) by mouth 2 (two) times daily.  Marland Kitchen esomeprazole (NEXIUM) 20 MG capsule Take 20 mg by mouth daily before breakfast.  . Fluticasone-Umeclidin-Vilant (TRELEGY ELLIPTA) 100-62.5-25 MCG/INH AEPB Inhale 1 puff into the lungs daily.  . furosemide (LASIX) 20 MG tablet Take 20 mg by mouth daily.  Marland Kitchen gabapentin (NEURONTIN) 300 MG capsule Take 1 capsule by mouth 2 (two) times daily.  Marland Kitchen glimepiride (AMARYL) 2 MG tablet Take 2 mg by mouth daily with breakfast.  . HYDROcodone-acetaminophen (NORCO) 10-325 MG tablet Take 0.5-1  tablet by mouth every 12 hours as needed for pain  . Insulin Glargine (LANTUS SOLOSTAR) 100 UNIT/ML Solostar Pen Inject 34 Units into the skin at bedtime.   . metFORMIN (GLUCOPHAGE) 500 MG tablet Take 1,000 mg by mouth 2 (two) times daily with a meal.   . Multiple Vitamins-Minerals (MULTIVITAMIN WITH MINERALS) tablet Take 1 tablet by mouth daily.  . nabumetone (RELAFEN) 750 MG tablet Take 750 mg by mouth 2 (two) times daily.  Marland Kitchen oxyCODONE-acetaminophen (PERCOCET) 10-325 MG tablet Take 1 tablet by mouth every 4 (four) hours as needed for pain.  Marland Kitchen PROAIR HFA 108 (90 Base) MCG/ACT inhaler Take 2 puffs by mouth every 6 (six) hours as needed for wheezing or shortness of breath.   . quiNINE (QUALAQUIN) 324 MG capsule Take 1 capsule by mouth every other day.   . simvastatin (ZOCOR) 20 MG tablet Take 20 mg by mouth daily at 6 PM.   . SYMBICORT 160-4.5 MCG/ACT inhaler Inhale 1 puff into the lungs 2 (two) times daily.   No facility-administered encounter medications on file as of 01/09/2020.    Surgical History: Past Surgical History:  Procedure Laterality Date  . ANTERIOR CERVICAL DECOMP/DISCECTOMY FUSION N/A 07/18/2015   Procedure: CERVICALFIVE-SIX, CERVICAL SIX-SEVEN ANTERIOR CERVICAL DECOMPRESSION/DISCECTOMY FUSION;  Surgeon: Tressie Stalker, MD;  Location: [facility];  Service: Neurosurgery;  Laterality: N/A;  C56 C67 anterior cervical decompression with fusion interbody prosthesis plating and bonegraft  . COLONOSCOPY    . COLONOSCOPY WITH PROPOFOL N/A 08/03/2019   Procedure: COLONOSCOPY WITH PROPOFOL;  Surgeon: Toledo, Boykin Nearing, MD;  Location: [facility];  Service:  Gastroenterology;  Laterality: N/A;  . ESOPHAGOGASTRODUODENOSCOPY (EGD) WITH PROPOFOL N/A 08/03/2019   Procedure: ESOPHAGOGASTRODUODENOSCOPY (EGD) WITH PROPOFOL;  Surgeon: Toledo, Boykin Nearingeodoro K, MD;  Location: [facility];  Service: Gastroenterology;  Laterality: N/A;  . RECTAL POLYPECTOMY  4-5 yrs ago    Medical History: Past  Medical History:  Diagnosis Date  . Arthritis    DDD- lumbar , thumbs - both hands   . COPD (chronic obstructive pulmonary disease) (HCC)   . Depression   . Diabetes mellitus without complication (HCC)   . GERD (gastroesophageal reflux disease)   . History of kidney stones 1990's    lithotripsy done   . Hypertension   . Sleep apnea 01/30/2016   [facility] [place], results not avail to pt. yet    Family History: Family History  Problem Relation Age of Onset  . Diabetes Mother   . Asthma Mother   . Hypertension Mother   . Hyperlipidemia Mother   . Hyperlipidemia Father   . Dementia Father   . Hypertension Father   . Heart Problems Father        pace maker    Social History: Social History   Socioeconomic History  . Marital status: Married    Spouse name: Not on file  . Number of children: Not on file  . Years of education: Not on file  . Highest education level: Not on file  Occupational History  . Not on file  Tobacco Use  . Smoking status: Current Every Day Smoker    Types: E-cigarettes    Last attempt to quit: 05/2016    Years since quitting: 3.6  . Smokeless tobacco: Never Used  . Tobacco comment: pt in the middle of wanting to stop  Vaping Use  . Vaping Use: Every day  . Substances: Nicotine  Substance and Sexual Activity  . Alcohol use: Yes    Alcohol/week: 3.0 standard drinks    Types: 3 Cans of beer per week    Comment: daily   . Drug use: No  . Sexual activity: Not on file  Other Topics Concern  . Not on file  Social History Narrative  . Not on file   Social Determinants of Health   Financial Resource Strain:   . Difficulty of Paying Living Expenses: Not on file  Food Insecurity:   . Worried About Programme researcher, broadcasting/film/videounning Out of Food in the Last Year: Not on file  . Ran Out of Food in the Last Year: Not on file  Transportation Needs:   . Lack of Transportation (Medical): Not on file  . Lack of Transportation (Non-Medical): Not on file  Physical  Activity:   . Days of Exercise per Week: Not on file  . Minutes of Exercise per Session: Not on file  Stress:   . Feeling of Stress : Not on file  Social Connections:   . Frequency of Communication with Friends and Family: Not on file  . Frequency of Social Gatherings with Friends and Family: Not on file  . Attends Religious Services: Not on file  . Active Member of Clubs or Organizations: Not on file  . Attends BankerClub or Organization Meetings: Not on file  . Marital Status: Not on file  Intimate Partner Violence:   . Fear of Current or Ex-Partner: Not on file  . Emotionally Abused: Not on file  . Physically Abused: Not on file  . Sexually Abused: Not on file    Vital Signs: Blood pressure (!) 96/56, pulse Marland Kitchen(!)  59, temperature 98.5 F (36.9 C), resp. rate 16, height 5\' 6"  (1.676 m), weight 223 lb 12.8 oz (101.5 kg), SpO2 97 %.  Examination: General Appearance: The patient is well-developed, well-nourished, and in no distress. Skin: Gross inspection of skin unremarkable. Head: normocephalic, no gross deformities. Eyes: no gross deformities noted. ENT: ears appear grossly normal no exudates. Neck: Supple. No thyromegaly. No LAD. Respiratory: clear bilaterally. Cardiovascular: Normal S1 and S2 without murmur or rub. Extremities: No cyanosis. pulses are equal. Neurologic: Alert and oriented. No involuntary movements.  LABS: No results found for this or any previous visit (from the past 2160 hour(s)).  Radiology: DG PAIN CLINIC C-ARM 1-60 MIN NO REPORT  Result Date: 11/22/2019 Fluoro was used, but no Radiologist interpretation will be provided. Please refer to "NOTES" tab for provider progress note.   No results found.  No results found.    Assessment and Plan: Patient Active Problem List   Diagnosis Date Noted  . PAD (peripheral artery disease) (HCC) 12/21/2019  . 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 Leg pain 10/10/2019  . COPD (chronic obstructive pulmonary disease) (HCC) 10/09/2019  . CAD (coronary artery disease) 10/09/2019  . Disorder of skeletal system 10/09/2019  . Pharmacologic therapy 10/09/2019  . Problems influencing health status 10/09/2019  . Nicotine dependence with current use 06/23/2017  . Morbid obesity (HCC) 06/23/2017  . Coronary artery disease due to lipid rich plaque 05/14/2017  . Chronic, continuous use of opioids 05/14/2017  . Fatty liver disease, nonalcoholic 05/14/2017  . Obesity (BMI 30-39.9) 05/14/2017  . Spondylolisthesis of lumbar region 02/13/2016  . Cervical herniated disc 07/18/2015  . SOB (shortness of breath) on exertion 07/13/2014  . DDD (degenerative disc disease), lumbar 02/28/2014  . Lumbar radiculitis 01/25/2014  . Obstructive chronic bronchitis without exacerbation (HCC) 06/14/2013  . Chronic pain syndrome 06/14/2013  . Depression (emotion) 06/14/2013  . Hyperlipidemia, unspecified 06/14/2013  . Hypertension 06/14/2013  . Microalbuminuric diabetic nephropathy (HCC) 06/14/2013  . Tobacco abuse 06/14/2013  . Diabetes type 2, uncontrolled (HCC) 03/31/2006    1. Obstructive chronic bronchitis without exacerbation (HCC) Stable, continue to use inhalers as discussed.  Samples or Breztri provided.   - CT CHEST LUNG CA SCREEN LOW DOSE W/O CM; Future  2. Hypertension, unspecified type Stable, continue current mgmt.   3. Nicotine dependence in remission, unspecified nicotine product type Smoking cessation counseling: 1. Pt acknowledges the risks of long term smoking, she will try to quite smoking. 2. Options for different medications including nicotine products, chewing gum, patch etc, Wellbutrin and Chantix is discussed 3. Goal and date of compete cessation is discussed 4. Total time spent in smoking cessation is 15 min. - CT CHEST LUNG CA SCREEN LOW DOSE W/O CM; Future  4. SOB (shortness of breath) -  Spirometry with Graph  General Counseling: I have discussed the findings of the evaluation and examination with Sheri.  I have also discussed any further diagnostic evaluation thatmay be needed or ordered today. Aaidyn verbalizes understanding of the findings of todays visit. We also reviewed his medications today and discussed drug interactions and side effects including but not limited excessive drowsiness and altered mental states. We also discussed that there is always a risk not just to him but also people around him. he has been encouraged to call the office with any questions or concerns that should arise related to todays visit.  Orders Placed  This Encounter  Procedures  . Spirometry with Graph    Order Specific Question:   Where should this test be performed?    Answer:   [facility]    Order Specific Question:   Basic spirometry    Answer:   Yes    Order Specific Question:   Spirometry pre & post bronchodilator    Answer:   No     Time spent: 25 This patient was seen by Blima Ledger AGNP-C in Collaboration with Dr. Freda Munro as a part of collaborative care agreement.   I have personally obtained a history, examined the patient, evaluated laboratory and imaging results, formulated the assessment and plan and placed orders.    Yevonne Pax, MD [facility] Pulmonary and Critical Care Sleep medicine

## 2020-01-09 NOTE — Progress Notes (Addendum)
PROVIDER NOTE: Information contained herein reflects review and annotations entered in association with encounter. Interpretation of such information and data should be left to medically-trained personnel. Information provided to patient can be located elsewhere in the medical record under "Patient Instructions". Document created using STT-dictation technology, any transcriptional errors that may result from process are unintentional.    Patient: Darin Waters  Service Category: Procedure  Provider: Oswaldo DoneFrancisco A Vidal Lampkins, MD  DOB: 10/01/1960  DOS: 01/10/2020  Location: [facility]  MRN: [id number]  Setting: Ambulatory - outpatient  Referring Provider: Delano MetzNaveira, Ledon Weihe, MD  Type: Established Patient  Specialty: Interventional Pain Management  PCP: Myrene BuddyGauger, Sarah Kathryn, NP   Primary Reason for Visit: Interventional Pain Management Treatment. CC: Hip Pain  Procedure:          Anesthesia, Analgesia, Anxiolysis:  Type: Diagnostic Epidural Steroid Injection #2 + Diagnostic Epidurogram #2  Region: Caudal Level: Sacrococcygeal   Laterality: Midline aiming at the left  Type: Moderate (Conscious) Sedation combined with Local Anesthesia Indication(s): Analgesia and Anxiety Route: Intravenous (IV) IV Access: Secured Sedation: Meaningful verbal contact was maintained at all times during the procedure  Local Anesthetic: Lidocaine 1-2%  Position: Prone   Indications: 1. Failed back surgical syndrome   2. Lumbar radiculitis (S1 dermatome) (Bilateral) (R>L)   3. DDD (degenerative disc disease), lumbar   4. Chronic lower extremity pain (1ry area of Pain) (Bilateral) (R>L)   5. Chronic low back pain (2ry area of Pain) (Bilateral) (L>R) w/ sciatica (Bilateral)    Pain Score: Pre-procedure: 0-No pain/10 Post-procedure: 0-No pain/10   Today the patient comes in indicating that his worst pain is on the left lower extremity and not on the right side as he had previously indicated to  me.  Pre-op Assessment:  Darin Waters is a 59 y.o. (year old), male patient, seen today for interventional treatment. He  has a past surgical history that includes Colonoscopy; Rectal polypectomy (4-5 yrs ago); Anterior cervical decomp/discectomy fusion (N/A, 07/18/2015); Esophagogastroduodenoscopy (egd) with propofol (N/A, 08/03/2019); and Colonoscopy with propofol (N/A, 08/03/2019). Darin Waters has a current medication list which includes the following prescription(s): amlodipine-benazepril, citalopram, docusate sodium, esomeprazole, trelegy ellipta, furosemide, gabapentin, glimepiride, hydrocodone-acetaminophen, insulin glargine, metformin, multivitamin with minerals, nabumetone, oxycodone-acetaminophen, proair hfa, quinine, simvastatin, and symbicort, and the following Facility-Administered Medications: fentanyl and midazolam. His primarily concern today is the Hip Pain  Initial Vital Signs:  Pulse/HCG Rate: (!) 54ECG Heart Rate: (!) 51 Temp: 98 F (36.7 C) Resp: 13 BP: 129/70 SpO2: 97 %  BMI: Estimated body mass index is 36.15 kg/m as calculated from the following:   Height as of this encounter: 5\' 6"  (1.676 m).   Weight as of this encounter: 224 lb (101.6 kg).  Risk Assessment: Allergies: Reviewed. He has No Known Allergies.  Allergy Precautions: None required Coagulopathies: Reviewed. None identified.  Blood-thinner therapy: None at this time Active Infection(s): Reviewed. None identified. Darin Waters is afebrile  Site Confirmation: Darin Waters was asked to confirm the procedure and laterality before marking the site Procedure checklist: Completed Consent: Before the procedure and under the influence of no sedative(s), amnesic(s), or anxiolytics, the patient was informed of the treatment options, risks and possible complications. To fulfill our ethical and legal obligations, as recommended by the American Medical Association's Code of Ethics, I have informed the patient of my clinical  impression; the nature and purpose of the treatment or procedure; the risks, benefits, and possible complications of the intervention; the alternatives, including doing nothing; the risk(s)  and benefit(s) of the alternative treatment(s) or procedure(s); and the risk(s) and benefit(s) of doing nothing. The patient was provided information about the general risks and possible complications associated with the procedure. These may include, but are not limited to: failure to achieve desired goals, infection, bleeding, organ or nerve damage, allergic reactions, paralysis, and death. In addition, the patient was informed of those risks and complications associated to Spine-related procedures, such as failure to decrease pain; infection (i.e.: Meningitis, epidural or intraspinal abscess); bleeding (i.e.: epidural hematoma, subarachnoid hemorrhage, or any other type of intraspinal or peri-dural bleeding); organ or nerve damage (i.e.: Any type of peripheral nerve, nerve root, or spinal cord injury) with subsequent damage to sensory, motor, and/or autonomic systems, resulting in permanent pain, numbness, and/or weakness of one or several areas of the body; allergic reactions; (i.e.: anaphylactic reaction); and/or death. Furthermore, the patient was informed of those risks and complications associated with the medications. These include, but are not limited to: allergic reactions (i.e.: anaphylactic or anaphylactoid reaction(s)); adrenal axis suppression; blood sugar elevation that in diabetics may result in ketoacidosis or comma; water retention that in patients with history of congestive heart failure may result in shortness of breath, pulmonary edema, and decompensation with resultant heart failure; weight gain; swelling or edema; medication-induced neural toxicity; particulate matter embolism and blood vessel occlusion with resultant organ, and/or nervous system infarction; and/or aseptic necrosis of one or more  joints. Finally, the patient was informed that Medicine is not an exact science; therefore, there is also the possibility of unforeseen or unpredictable risks and/or possible complications that may result in a catastrophic outcome. The patient indicated having understood very clearly. We have given the patient no guarantees and we have made no promises. Enough time was given to the patient to ask questions, all of which were answered to the patient's satisfaction. Darin Waters has indicated that he wanted to continue with the procedure. Attestation: I, the ordering provider, attest that I have discussed with the patient the benefits, risks, side-effects, alternatives, likelihood of achieving goals, and potential problems during recovery for the procedure that I have provided informed consent. Date  Time: 01/10/2020  8:51 AM  Pre-Procedure Preparation:  Monitoring: As per clinic protocol. Respiration, ETCO2, SpO2, BP, heart rate and rhythm monitor placed and checked for adequate function Safety Precautions: Patient was assessed for positional comfort and pressure points before starting the procedure. Time-out: I initiated and conducted the "Time-out" before starting the procedure, as per protocol. The patient was asked to participate by confirming the accuracy of the "Time Out" information. Verification of the correct person, site, and procedure were performed and confirmed by me, the nursing staff, and the patient. "Time-out" conducted as per Joint Commission's Universal Protocol (UP.01.01.01). Time: 1005  Description of Procedure:          Target Area: Caudal Epidural Canal. Approach: Midline approach. Area Prepped: Entire Posterior Sacrococcygeal Region DuraPrep (Iodine Povacrylex [0.7% available iodine] and Isopropyl Alcohol, 74% w/w) Safety Precautions: Aspiration looking for blood return was conducted prior to all injections. At no point did we inject any substances, as a needle was being  advanced. No attempts were made at seeking any paresthesias. Safe injection practices and needle disposal techniques used. Medications properly checked for expiration dates. SDV (single dose vial) medications used. Description of the Procedure: Protocol guidelines were followed. The patient was placed in position over the fluoroscopy table. The target area was identified and the area prepped in the usual manner. Skin & deeper tissues  infiltrated with local anesthetic. Appropriate amount of time allowed to pass for local anesthetics to take effect. The procedure needles were then advanced to the target area. Proper needle placement secured. Negative aspiration confirmed. Solution injected in intermittent fashion, asking for systemic symptoms every 0.5cc of injectate. The needles were then removed and the area cleansed, making sure to leave some of the prepping solution back to take advantage of its long term bactericidal properties. Vitals:   01/10/20 1003 01/10/20 1008 01/10/20 1010 01/10/20 1020  BP: (!) 148/76 119/70 111/72 124/67  Pulse:      Resp: 10 10 10 11   Temp:      SpO2: 92% 92% 92% 96%  Weight:      Height:        Start Time: 1005 hrs. End Time: 1011 hrs. Materials:  Needle(s) Type: Epidural needle Gauge: 17G Length: 3.5-in Medication(s): Please see orders for medications and dosing details.  Imaging Guidance (Spinal):          Type of Imaging Technique: Fluoroscopy Guidance (Spinal) Indication(s): Assistance in needle guidance and placement for procedures requiring needle placement in or near specific anatomical locations not easily accessible without such assistance. Exposure Time: Please see nurses notes. Contrast: Before injecting any contrast, we confirmed that the patient did not have an allergy to iodine, shellfish, or radiological contrast. Once satisfactory needle placement was completed at the desired level, radiological contrast was injected. Contrast injected under live  fluoroscopy. No contrast complications. See chart for type and volume of contrast used. Fluoroscopic Guidance: I was personally present during the use of fluoroscopy. "Tunnel Vision Technique" used to obtain the best possible view of the target area. Parallax error corrected before commencing the procedure. "Direction-depth-direction" technique used to introduce the needle under continuous pulsed fluoroscopy. Once target was reached, antero-posterior, oblique, and lateral fluoroscopic projection used confirm needle placement in all planes. Images permanently stored in EMR.    Interpretation: I personally interpreted the imaging intraoperatively. Adequate needle placement confirmed in multiple planes. Appropriate spread of contrast into desired area was observed. No evidence of afferent or efferent intravascular uptake. No intrathecal or subarachnoid spread observed. Permanent images saved into the patient's record.  Diagnostic Epidurogram:  Contrast: Before injecting any contrast, we confirmed that the patient did not have an allergy to iodine, shellfish, or radiological contrast. For accuracy purposes, contrast was injected under live fluoroscopy. Study personally interpreted intraoparatively. Type: Non-ionic, water soluble, hypoallergenic, myelogram-compatible, radiological contrast used. Please see orders and nurses note for specific choice of contrast. Volume: Please see nurses note for injected volume.  Observations:  Spinal Alignment: Adequate       Vertebral body: Intact Lamina: Surgical changes observed Disc: Loss of disc hight Facet: Arthritic changes  Moderate Hardware: Posterior fusion device using pedicle screws and prostatic disc  Spread: Appropriate epidural spread of contrast Anterior: Adequate Posterior: Adequate Superior (cephalad): Flow limited to: The region between the L5 and S1 intervertebral disc.  No contrast seen delineating the L5 nerve roots on either side. Inferior  (caudad): Flow limited to: The S1 and S2 nerve roots, bilaterally. Right lateral: ill-defined Left lateral: Flow limited to: The sacral nerve roots but no contrast seen at the L5. Plica medialis dorsalis: Medially aligned Nerve root(s): Flow limited to: The S1 and S2 nerve roots, bilaterally, but no L5 seen. Epidural extravasation: None observed Intrathecal: No intrathecal spread identified Subarachnoid: No subarachnoid spread pattern observed Vascular: No evidence of afferent or efferent intravascular uptake  Impression: Technically successful epidurogram. See above  for details Note: Hard copies saved to EMR.  Antibiotic Prophylaxis:   Anti-infectives (From admission, onward)   None     Indication(s): None identified  Post-operative Assessment:  Post-procedure Vital Signs:  Pulse/HCG Rate: (!) 54(!) 55 Temp: 98 F (36.7 C) Resp: 11 BP: 124/67 SpO2: 96 %  EBL: None  Complications: No immediate post-treatment complications observed by team, or reported by patient.  Note: The patient tolerated the entire procedure well. A repeat set of vitals were taken after the procedure and the patient was kept under observation following institutional policy, for this type of procedure. Post-procedural neurological assessment was performed, showing return to baseline, prior to discharge. The patient was provided with post-procedure discharge instructions, including a section on how to identify potential problems. Should any problems arise concerning this procedure, the patient was given instructions to immediately contact us, at any time, without hesitation. In any case, we plan to contact the patient by telephone for a follow-up status report regarding this interventional procedure.  Comments:  No additional relevant information.  Plan of Care  Orders:  Orders Placed This Encounter  Procedures  . Caudal Epidural Injection    Scheduling Instructions:     Laterality: Midline      Level(s): Sacrococcygeal canal (Tailbone area)     Sedation: Patient's choice     Timeframe: Today    Order Specific Question:   Where will this procedure be performed?    Answer:   [facility]  . DG PA[facility] C-ARM 1-60 MIN NO REPORT    Intraoperative interpretation by procedural physician at [facility].    Standing Status:   Standing    Number of Occurrences:   1    Order Specific Question:   Reason for exam:    Answer:   Assistance in needle guidance and placement for procedures requiring needle placement in or near specific anatomical locations not easily accessible without such assistance.  . Informed Consent Details: Physician/Practitioner Attestation; Transcribe to consent form and obtain patient signature    Nursing Order: Transcribe to consent form and obtain patient signature. Note: Always confirm laterality of pain with Mr. Mcguiness, before procedure.    Order Specific Question:   Physician/Practitioner attestation of informed consent for procedure/surgical case    Answer:   I, the physician/practitioner, attest that I have discussed with the patient the benefits, risks, side effects, alternatives, likelihood of achieving goals and potential problems during recovery for the procedure that I have provided informed consent.    Order Specific Question:   Procedure    Answer:   Caudal epidural steroid injection    Order Specific Question:   Physician/Practitioner performing the procedure    Answer:   Yamna Mackel A. Laban Emperor, MD    Order Specific Question:   Indication/Reason    Answer:   Low back pain and lower extremity pain secondary to lumbosacral radiculitis  . Provide equipment / supplies at bedside    "Epidural Tray" (Disposable  single use) Catheter: NOT required    Standing Status:   Standing    Number of Occurrences:   1    Order Specific Question:   Specify    Answer:   Epidural Tray   Chronic Opioid Analgesic:  Hydrocodone/APAP 10/325 1 tablet p.o.  twice daily (20 mg/day of hydrocodone) (20 MME/day)  MME/day: 20 mg/day   Medications ordered for procedure: Meds ordered this encounter  Medications  . iohexol (OMNIPAQUE) 180 MG/ML injection 10 mL    Must be  Myelogram-compatible. If not available, you may substitute with a water-soluble, non-ionic, hypoallergenic, myelogram-compatible radiological contrast medium.  Marland Kitchen lidocaine (XYLOCAINE) 2 % (with pres) injection 400 mg  . lactated ringers infusion 1,000 mL  . midazolam (VERSED) 5 MG/5ML injection 1-2 mg    Make sure Flumazenil is available in the pyxis when using this medication. If oversedation occurs, administer 0.2 mg IV over 15 sec. If after 45 sec no response, administer 0.2 mg again over 1 min; may repeat at 1 min intervals; not to exceed 4 doses (1 mg)  . fentaNYL (SUBLIMAZE) injection 25-50 mcg    Make sure Narcan is available in the pyxis when using this medication. In the event of respiratory depression (RR< 8/min): Titrate NARCAN (naloxone) in increments of 0.1 to 0.2 mg IV at 2-3 minute intervals, until desired degree of reversal.  . triamcinolone acetonide (KENALOG-40) injection 40 mg  . sodium chloride flush (NS) 0.9 % injection 2 mL  . ropivacaine (PF) 2 mg/mL (0.2%) (NAROPIN) injection 2 mL   Medications administered: We administered iohexol, lidocaine, lactated ringers, midazolam, fentaNYL, triamcinolone acetonide, sodium chloride flush, and ropivacaine (PF) 2 mg/mL (0.2%).  See the medical record for exact dosing, route, and time of administration.  Follow-up plan:   Return in about 2 weeks (around 01/24/2020) for (F2F), (PP) Follow-up.       Interventional management options: Planned, scheduled, and/or pending:        Considering:      PRN Procedures:   Diagnostic midline caudal ESI + diagnostic epidurogram #2     Recent Visits Date Type Provider Dept  12/14/19 Telemedicine Delano Metz, MD [facility]  11/22/19 Procedure visit Delano Metz, MD [facility]  11/14/19 Office Visit Delano Metz, MD [facility]  Showing recent visits within past 90 days and meeting all other requirements Today's Visits Date Type Provider Dept  01/10/20 Procedure visit Delano Metz, MD [facility]  Showing today's visits and meeting all other requirements Future Appointments Date Type Provider Dept  01/25/20 Appointment Delano Metz, MD [facility]  Showing future appointments within next 90 days and meeting all other requirements  Disposition: Discharge home  Discharge (Date  Time): 01/10/2020; 1034 hrs.   Primary Care Physician: Myrene Buddy, NP Location: [facility] Note by: Oswaldo Done, MD Date: 01/10/2020; Time: 11:08 AM  Disclaimer:  Medicine is not an Visual merchandiser. The only guarantee in medicine is that nothing is guaranteed. It is important to note that the decision to proceed with this intervention was based on the information collected from the patient. The Data and conclusions were drawn from the patient's questionnaire, the interview, and the physical examination. Because the information was provided in large part by the patient, it cannot be guaranteed that it has not been purposely or unconsciously manipulated. Every effort has been made to obtain as much relevant data as possible for this evaluation. It is important to note that the conclusions that lead to this procedure are derived in large part from the available data. Always take into account that the treatment will also be dependent on availability of resources and existing treatment guidelines, considered by other Pain Management Practitioners as being common knowledge and practice, at the time of the intervention. For Medico-Legal purposes, it is also important to point out that variation in procedural techniques and pharmacological choices are the acceptable norm.  The indications, contraindications, technique, and results of the above procedure should only be  interpreted and judged by a Board-Certified Interventional Pain Specialist with extensive familiarity and expertise in the same exact procedure and technique.

## 2020-01-10 ENCOUNTER — Ambulatory Visit
Admission: RE | Admit: 2020-01-10 | Discharge: 2020-01-10 | Disposition: A | Discharge status: Home / Self Care | Payer: Medicare HMO | Source: Ambulatory Visit | Attending: Pain Medicine | Admitting: Pain Medicine

## 2020-01-10 ENCOUNTER — Encounter: Payer: Self-pay | Admitting: Pain Medicine

## 2020-01-10 ENCOUNTER — Ambulatory Visit (HOSPITAL_BASED_OUTPATIENT_CLINIC_OR_DEPARTMENT_OTHER): Payer: Medicare HMO | Admitting: Pain Medicine

## 2020-01-10 VITALS — BP 124/67 | HR 54 | Temp 98.0°F | Resp 11 | Ht 66.0 in | Wt 224.0 lb

## 2020-01-10 DIAGNOSIS — M5441 Lumbago with sciatica, right side: Secondary | ICD-10-CM | POA: Insufficient documentation

## 2020-01-10 DIAGNOSIS — M961 Postlaminectomy syndrome, not elsewhere classified: Secondary | ICD-10-CM | POA: Insufficient documentation

## 2020-01-10 DIAGNOSIS — M79605 Pain in left leg: Secondary | ICD-10-CM | POA: Diagnosis present

## 2020-01-10 DIAGNOSIS — G8929 Other chronic pain: Secondary | ICD-10-CM | POA: Diagnosis present

## 2020-01-10 DIAGNOSIS — M5136 Other intervertebral disc degeneration, lumbar region: Secondary | ICD-10-CM | POA: Insufficient documentation

## 2020-01-10 DIAGNOSIS — M79604 Pain in right leg: Secondary | ICD-10-CM | POA: Diagnosis present

## 2020-01-10 DIAGNOSIS — M5416 Radiculopathy, lumbar region: Secondary | ICD-10-CM | POA: Diagnosis present

## 2020-01-10 DIAGNOSIS — M5442 Lumbago with sciatica, left side: Secondary | ICD-10-CM | POA: Insufficient documentation

## 2020-01-10 MED ORDER — TRIAMCINOLONE ACETONIDE 40 MG/ML IJ SUSP
40.0000 mg | Freq: Once | INTRAMUSCULAR | Status: AC
  Administered 2020-01-10: 40 mg
  Filled 2020-01-10: qty 1

## 2020-01-10 MED ORDER — LACTATED RINGERS IV SOLN
1000.0000 mL | Freq: Once | INTRAVENOUS | Status: AC
  Administered 2020-01-10: 1000 mL via INTRAVENOUS

## 2020-01-10 MED ORDER — LIDOCAINE HCL 2 % IJ SOLN
20.0000 mL | Freq: Once | INTRAMUSCULAR | Status: AC
  Administered 2020-01-10: 200 mg

## 2020-01-10 MED ORDER — IOHEXOL 180 MG/ML  SOLN
10.0000 mL | Freq: Once | INTRAMUSCULAR | Status: AC
  Administered 2020-01-10: 10 mL via EPIDURAL
  Filled 2020-01-10: qty 20

## 2020-01-10 MED ORDER — SODIUM CHLORIDE 0.9% FLUSH
2.0000 mL | Freq: Once | INTRAVENOUS | Status: AC
  Administered 2020-01-10: 2 mL

## 2020-01-10 MED ORDER — MIDAZOLAM HCL 5 MG/5ML IJ SOLN
1.0000 mg | INTRAMUSCULAR | Status: DC | PRN
  Administered 2020-01-10: 1 mg via INTRAVENOUS
  Filled 2020-01-10: qty 5

## 2020-01-10 MED ORDER — FENTANYL CITRATE (PF) 100 MCG/2ML IJ SOLN
25.0000 ug | INTRAMUSCULAR | Status: DC | PRN
  Administered 2020-01-10: 50 ug via INTRAVENOUS
  Filled 2020-01-10: qty 2

## 2020-01-10 MED ORDER — ROPIVACAINE HCL 2 MG/ML IJ SOLN
2.0000 mL | Freq: Once | INTRAMUSCULAR | Status: AC
  Administered 2020-01-10: 2 mL via EPIDURAL
  Filled 2020-01-10: qty 10

## 2020-01-10 MED ORDER — SODIUM CHLORIDE (PF) 0.9 % IJ SOLN
INTRAMUSCULAR | Status: AC
  Filled 2020-01-10: qty 10

## 2020-01-10 NOTE — Patient Instructions (Signed)

## 2020-01-11 ENCOUNTER — Telehealth: Payer: Self-pay

## 2020-01-11 NOTE — Telephone Encounter (Signed)
Denies any needs at this time. Instructed to call if needed. 

## 2020-01-12 ENCOUNTER — Ambulatory Visit (INDEPENDENT_AMBULATORY_CARE_PROVIDER_SITE_OTHER): Payer: Medicare HMO

## 2020-01-12 ENCOUNTER — Encounter (INDEPENDENT_AMBULATORY_CARE_PROVIDER_SITE_OTHER): Payer: Self-pay | Admitting: Nurse Practitioner

## 2020-01-12 ENCOUNTER — Ambulatory Visit (INDEPENDENT_AMBULATORY_CARE_PROVIDER_SITE_OTHER): Payer: Medicare HMO | Admitting: Nurse Practitioner

## 2020-01-12 ENCOUNTER — Telehealth (INDEPENDENT_AMBULATORY_CARE_PROVIDER_SITE_OTHER): Payer: Self-pay

## 2020-01-12 ENCOUNTER — Other Ambulatory Visit: Payer: Self-pay

## 2020-01-12 VITALS — BP 148/63 | HR 62 | Resp 16 | Wt 223.0 lb

## 2020-01-12 DIAGNOSIS — I10 Essential (primary) hypertension: Secondary | ICD-10-CM

## 2020-01-12 DIAGNOSIS — I739 Peripheral vascular disease, unspecified: Secondary | ICD-10-CM | POA: Diagnosis not present

## 2020-01-12 DIAGNOSIS — E785 Hyperlipidemia, unspecified: Secondary | ICD-10-CM | POA: Diagnosis not present

## 2020-01-12 DIAGNOSIS — M79604 Pain in right leg: Secondary | ICD-10-CM

## 2020-01-12 DIAGNOSIS — M79605 Pain in left leg: Secondary | ICD-10-CM | POA: Diagnosis not present

## 2020-01-12 DIAGNOSIS — Z720 Tobacco use: Secondary | ICD-10-CM

## 2020-01-12 DIAGNOSIS — J449 Chronic obstructive pulmonary disease, unspecified: Secondary | ICD-10-CM

## 2020-01-12 DIAGNOSIS — F17201 Nicotine dependence, unspecified, in remission: Secondary | ICD-10-CM

## 2020-01-12 NOTE — Telephone Encounter (Signed)
Spoke with the patient's wife and he is scheduled for a LLE angio with Dr. Gilda Crease with a 6:45 am arrival time to the [facility]. Covid testing is on 02/03/20 between 8-1 pm at the [facility]. Pre-procedure instructions were discussed and will be mailed. Patient was offered 01/24/20 to have his procedure and was told they would be camping at this time.

## 2020-01-18 ENCOUNTER — Encounter (INDEPENDENT_AMBULATORY_CARE_PROVIDER_SITE_OTHER): Payer: Self-pay | Admitting: Nurse Practitioner

## 2020-01-18 NOTE — Progress Notes (Signed)
Subjective:    Patient ID: Darin Waters, male    DOB: [birth year], 59 y.o.   MRN: [id number] Chief Complaint  Patient presents with  . Follow-up    ultrasound followup    The patient returns to the office for followup and review of the noninvasive studies. There has been a significant deterioration in the lower extremity symptoms.  The patient notes interval shortening of their claudication distance and development of mild rest pain symptoms. No new ulcers or wounds have occurred since the last visit.  There have been no significant changes to the patient's overall health care.  The patient denies amaurosis fugax or recent TIA symptoms. There are no recent neurological changes noted. The patient denies history of DVT, PE or superficial thrombophlebitis. The patient denies recent episodes of angina or shortness of breath.   ABI's Rt=1.15 and Lt=0.67 (no previous ) Duplex US of the lower extremity arterial system shows triphasic tibial artery waveforms in the right lower extremity.  As well as strong toe waveforms.  The left lower extremity also underwent a lower extremity arterial duplex study.  There is monophasic flow throughout the left lower extremity from the external iliac artery and beyond.  This does suggest inflow disease.  The vessels from the common femoral artery and below seem to be widely patent.   Review of Systems  Cardiovascular:       Claudication  All other systems reviewed and are negative.      Objective:   Physical Exam Vitals reviewed.  HENT:     Head: Normocephalic.  Cardiovascular:     Rate and Rhythm: Normal rate.     Pulses: Decreased pulses.  Pulmonary:     Effort: Pulmonary effort is normal.     Breath sounds: Normal breath sounds.  Neurological:     Mental Status: He is alert and oriented to person, place, and time.  Psychiatric:        Mood and Affect: Mood normal.        Behavior: Behavior normal.        Thought Content: Thought content  normal.        Judgment: Judgment normal.     BP (!) 148/63 (BP Location: Left Arm)   Pulse 62   Resp 16   Wt 223 lb (101.2 kg)   BMI 35.99 kg/m   Past Medical History:  Diagnosis Date  . Arthritis    DDD- lumbar , thumbs - both hands   . COPD (chronic obstructive pulmonary disease) (HCC)   . Depression   . Diabetes mellitus without complication (HCC)   . GERD (gastroesophageal reflux disease)   . History of kidney stones 1990's    lithotripsy done   . Hypertension   . Sleep apnea 01/30/2016   [facility] in [place], results not avail to pt. yet    Social History   Socioeconomic History  . Marital status: Married    Spouse name: Not on file  . Number of children: Not on file  . Years of education: Not on file  . Highest education level: Not on file  Occupational History  . Not on file  Tobacco Use  . Smoking status: Current Every Day Smoker    Types: E-cigarettes    Last attempt to quit: 05/2016    Years since quitting: 3.7  . Smokeless tobacco: Never Used  . Tobacco comment: pt in the middle of wanting to stop  Vaping Use  . Vaping Use: Every  day  . Substances: Nicotine  Substance and Sexual Activity  . Alcohol use: Yes    Alcohol/week: 3.0 standard drinks    Types: 3 Cans of beer per week    Comment: daily   . Drug use: No  . Sexual activity: Not on file  Other Topics Concern  . Not on file  Social History Narrative  . Not on file   Social Determinants of Health   Financial Resource Strain:   . Difficulty of Paying Living Expenses: Not on file  Food Insecurity:   . Worried About Programme researcher, broadcasting/film/video in the Last Year: Not on file  . Ran Out of Food in the Last Year: Not on file  Transportation Needs:   . Lack of Transportation (Medical): Not on file  . Lack of Transportation (Non-Medical): Not on file  Physical Activity:   . Days of Exercise per Week: Not on file  . Minutes of Exercise per Session: Not on file  Stress:   . Feeling of  Stress : Not on file  Social Connections:   . Frequency of Communication with Friends and Family: Not on file  . Frequency of Social Gatherings with Friends and Family: Not on file  . Attends Religious Services: Not on file  . Active Member of Clubs or Organizations: Not on file  . Attends Banker Meetings: Not on file  . Marital Status: Not on file  Intimate Partner Violence:   . Fear of Current or Ex-Partner: Not on file  . Emotionally Abused: Not on file  . Physically Abused: Not on file  . Sexually Abused: Not on file    Past Surgical History:  Procedure Laterality Date  . ANTERIOR CERVICAL DECOMP/DISCECTOMY FUSION N/A 07/18/2015   Procedure: CERVICALFIVE-SIX, CERVICAL SIX-SEVEN ANTERIOR CERVICAL DECOMPRESSION/DISCECTOMY FUSION;  Surgeon: Tressie Stalker, MD;  Location: [facility];  Service: Neurosurgery;  Laterality: N/A;  C56 C67 anterior cervical decompression with fusion interbody prosthesis plating and bonegraft  . COLONOSCOPY    . COLONOSCOPY WITH PROPOFOL N/A 08/03/2019   Procedure: COLONOSCOPY WITH PROPOFOL;  Surgeon: Toledo, Boykin Nearing, MD;  Location: [facility];  Service: Gastroenterology;  Laterality: N/A;  . ESOPHAGOGASTRODUODENOSCOPY (EGD) WITH PROPOFOL N/A 08/03/2019   Procedure: ESOPHAGOGASTRODUODENOSCOPY (EGD) WITH PROPOFOL;  Surgeon: Toledo, Boykin Nearing, MD;  Location: [facility];  Service: Gastroenterology;  Laterality: N/A;  . RECTAL POLYPECTOMY  4-5 yrs ago    Family History  Problem Relation Age of Onset  . Diabetes Mother   . Asthma Mother   . Hypertension Mother   . Hyperlipidemia Mother   . Hyperlipidemia Father   . Dementia Father   . Hypertension Father   . Heart Problems Father        pace maker    No Known Allergies     Assessment & Plan:   1. PAD (peripheral artery disease) (HCC) Recommend:  The patient has experienced increased symptoms and is now describing lifestyle limiting claudication and mild rest pain.   Given  the severity of the patient's lower extremity symptoms the patient should undergo angiography and intervention.  Risk and benefits were reviewed the patient.  Indications for the procedure were reviewed.  All questions were answered, the patient agrees to proceed.   The patient should continue walking and begin a more formal exercise program.  The patient should continue antiplatelet therapy and aggressive treatment of the lipid abnormalities  The patient will follow up with me after the angiogram.   2. Hyperlipidemia, unspecified  hyperlipidemia type Continue statin as ordered and reviewed, no changes at this time   3. Tobacco abuse Smoking cessation was discussed, 3-10 minutes spent on this topic specifically   4. Hypertension, unspecified type Continue antihypertensive medications as already ordered, these medications have been reviewed and there are no changes at this time.    Current Outpatient Medications on File Prior to Visit  Medication Sig Dispense Refill  . amLODipine-benazepril (LOTREL) 5-20 MG capsule Take 1 capsule by mouth daily before breakfast.     . atorvastatin (LIPITOR) 40 MG tablet Take 1 tablet by mouth daily.    . citalopram (CELEXA) 10 MG tablet Take 10 mg by mouth daily before breakfast.     . docusate sodium (COLACE) 100 MG capsule Take 1 capsule (100 mg total) by mouth 2 (two) times daily. 60 capsule 0  . esomeprazole (NEXIUM) 20 MG capsule Take 20 mg by mouth daily before breakfast.    . Fluticasone-Umeclidin-Vilant (TRELEGY ELLIPTA) 100-62.5-25 MCG/INH AEPB Inhale 1 puff into the lungs daily. 60 each 2  . furosemide (LASIX) 20 MG tablet Take 20 mg by mouth daily.  1  . gabapentin (NEURONTIN) 300 MG capsule Take 1 capsule by mouth 2 (two) times daily.    Marland Kitchen glimepiride (AMARYL) 2 MG tablet Take 2 mg by mouth daily with breakfast.    . HYDROcodone-acetaminophen (NORCO) 10-325 MG tablet Take 0.5-1 tablet by mouth every 12 hours as needed for pain    . Insulin  Glargine (LANTUS SOLOSTAR) 100 UNIT/ML Solostar Pen Inject 65 Units into the skin at bedtime.     . metFORMIN (GLUCOPHAGE) 500 MG tablet Take 1,000 mg by mouth 2 (two) times daily with a meal.     . Multiple Vitamins-Minerals (MULTIVITAMIN WITH MINERALS) tablet Take 1 tablet by mouth daily.    . nabumetone (RELAFEN) 750 MG tablet Take 750 mg by mouth 2 (two) times daily.    Marland Kitchen PROAIR HFA 108 (90 Base) MCG/ACT inhaler Take 2 puffs by mouth every 6 (six) hours as needed for wheezing or shortness of breath.   2  . quiNINE (QUALAQUIN) 324 MG capsule Take 1 capsule by mouth every other day.   1  . simvastatin (ZOCOR) 20 MG tablet Take 20 mg by mouth daily at 6 PM.     . SYMBICORT 160-4.5 MCG/ACT inhaler Inhale 1 puff into the lungs 2 (two) times daily. 1 Inhaler 2  . oxyCODONE-acetaminophen (PERCOCET) 10-325 MG tablet Take 1 tablet by mouth every 4 (four) hours as needed for pain. (Patient not taking: Reported on 01/12/2020) 100 tablet 0   No current facility-administered medications on file prior to visit.    There are no Patient Instructions on file for this visit. No follow-ups on file.   Georgiana Spinner, NP

## 2020-01-18 NOTE — H&P (View-Only) (Signed)
Subjective:    Patient ID: Darin Waters, male    DOB: [birth year], 59 y.o.   MRN: [id number] Chief Complaint  Patient presents with  . Follow-up    ultrasound followup    The patient returns to the office for followup and review of the noninvasive studies. There has been a significant deterioration in the lower extremity symptoms.  The patient notes interval shortening of their claudication distance and development of mild rest pain symptoms. No new ulcers or wounds have occurred since the last visit.  There have been no significant changes to the patient's overall health care.  The patient denies amaurosis fugax or recent TIA symptoms. There are no recent neurological changes noted. The patient denies history of DVT, PE or superficial thrombophlebitis. The patient denies recent episodes of angina or shortness of breath.   ABI's Rt=1.15 and Lt=0.67 (no previous ) Duplex US of the lower extremity arterial system shows triphasic tibial artery waveforms in the right lower extremity.  As well as strong toe waveforms.  The left lower extremity also underwent a lower extremity arterial duplex study.  There is monophasic flow throughout the left lower extremity from the external iliac artery and beyond.  This does suggest inflow disease.  The vessels from the common femoral artery and below seem to be widely patent.   Review of Systems  Cardiovascular:       Claudication  All other systems reviewed and are negative.      Objective:   Physical Exam Vitals reviewed.  HENT:     Head: Normocephalic.  Cardiovascular:     Rate and Rhythm: Normal rate.     Pulses: Decreased pulses.  Pulmonary:     Effort: Pulmonary effort is normal.     Breath sounds: Normal breath sounds.  Neurological:     Mental Status: He is alert and oriented to person, place, and time.  Psychiatric:        Mood and Affect: Mood normal.        Behavior: Behavior normal.        Thought Content: Thought content  normal.        Judgment: Judgment normal.     BP (!) 148/63 (BP Location: Left Arm)   Pulse 62   Resp 16   Wt 223 lb (101.2 kg)   BMI 35.99 kg/m   Past Medical History:  Diagnosis Date  . Arthritis    DDD- lumbar , thumbs - both hands   . COPD (chronic obstructive pulmonary disease) (HCC)   . Depression   . Diabetes mellitus without complication (HCC)   . GERD (gastroesophageal reflux disease)   . History of kidney stones 1990's    lithotripsy done   . Hypertension   . Sleep apnea 01/30/2016   [facility] in [place], results not avail to pt. yet    Social History   Socioeconomic History  . Marital status: Married    Spouse name: Not on file  . Number of children: Not on file  . Years of education: Not on file  . Highest education level: Not on file  Occupational History  . Not on file  Tobacco Use  . Smoking status: Current Every Day Smoker    Types: E-cigarettes    Last attempt to quit: 05/2016    Years since quitting: 3.7  . Smokeless tobacco: Never Used  . Tobacco comment: pt in the middle of wanting to stop  Vaping Use  . Vaping Use: Every  day  . Substances: Nicotine  Substance and Sexual Activity  . Alcohol use: Yes    Alcohol/week: 3.0 standard drinks    Types: 3 Cans of beer per week    Comment: daily   . Drug use: No  . Sexual activity: Not on file  Other Topics Concern  . Not on file  Social History Narrative  . Not on file   Social Determinants of Health   Financial Resource Strain:   . Difficulty of Paying Living Expenses: Not on file  Food Insecurity:   . Worried About Programme researcher, broadcasting/film/video in the Last Year: Not on file  . Ran Out of Food in the Last Year: Not on file  Transportation Needs:   . Lack of Transportation (Medical): Not on file  . Lack of Transportation (Non-Medical): Not on file  Physical Activity:   . Days of Exercise per Week: Not on file  . Minutes of Exercise per Session: Not on file  Stress:   . Feeling of  Stress : Not on file  Social Connections:   . Frequency of Communication with Friends and Family: Not on file  . Frequency of Social Gatherings with Friends and Family: Not on file  . Attends Religious Services: Not on file  . Active Member of Clubs or Organizations: Not on file  . Attends Banker Meetings: Not on file  . Marital Status: Not on file  Intimate Partner Violence:   . Fear of Current or Ex-Partner: Not on file  . Emotionally Abused: Not on file  . Physically Abused: Not on file  . Sexually Abused: Not on file    Past Surgical History:  Procedure Laterality Date  . ANTERIOR CERVICAL DECOMP/DISCECTOMY FUSION N/A 07/18/2015   Procedure: CERVICALFIVE-SIX, CERVICAL SIX-SEVEN ANTERIOR CERVICAL DECOMPRESSION/DISCECTOMY FUSION;  Surgeon: Tressie Stalker, MD;  Location: [facility];  Service: Neurosurgery;  Laterality: N/A;  C56 C67 anterior cervical decompression with fusion interbody prosthesis plating and bonegraft  . COLONOSCOPY    . COLONOSCOPY WITH PROPOFOL N/A 08/03/2019   Procedure: COLONOSCOPY WITH PROPOFOL;  Surgeon: Toledo, Boykin Nearing, MD;  Location: [facility];  Service: Gastroenterology;  Laterality: N/A;  . ESOPHAGOGASTRODUODENOSCOPY (EGD) WITH PROPOFOL N/A 08/03/2019   Procedure: ESOPHAGOGASTRODUODENOSCOPY (EGD) WITH PROPOFOL;  Surgeon: Toledo, Boykin Nearing, MD;  Location: [facility];  Service: Gastroenterology;  Laterality: N/A;  . RECTAL POLYPECTOMY  4-5 yrs ago    Family History  Problem Relation Age of Onset  . Diabetes Mother   . Asthma Mother   . Hypertension Mother   . Hyperlipidemia Mother   . Hyperlipidemia Father   . Dementia Father   . Hypertension Father   . Heart Problems Father        pace maker    No Known Allergies     Assessment & Plan:   1. PAD (peripheral artery disease) (HCC) Recommend:  The patient has experienced increased symptoms and is now describing lifestyle limiting claudication and mild rest pain.   Given  the severity of the patient's lower extremity symptoms the patient should undergo angiography and intervention.  Risk and benefits were reviewed the patient.  Indications for the procedure were reviewed.  All questions were answered, the patient agrees to proceed.   The patient should continue walking and begin a more formal exercise program.  The patient should continue antiplatelet therapy and aggressive treatment of the lipid abnormalities  The patient will follow up with me after the angiogram.   2. Hyperlipidemia, unspecified  hyperlipidemia type Continue statin as ordered and reviewed, no changes at this time   3. Tobacco abuse Smoking cessation was discussed, 3-10 minutes spent on this topic specifically   4. Hypertension, unspecified type Continue antihypertensive medications as already ordered, these medications have been reviewed and there are no changes at this time.    Current Outpatient Medications on File Prior to Visit  Medication Sig Dispense Refill  . amLODipine-benazepril (LOTREL) 5-20 MG capsule Take 1 capsule by mouth daily before breakfast.     . atorvastatin (LIPITOR) 40 MG tablet Take 1 tablet by mouth daily.    . citalopram (CELEXA) 10 MG tablet Take 10 mg by mouth daily before breakfast.     . docusate sodium (COLACE) 100 MG capsule Take 1 capsule (100 mg total) by mouth 2 (two) times daily. 60 capsule 0  . esomeprazole (NEXIUM) 20 MG capsule Take 20 mg by mouth daily before breakfast.    . Fluticasone-Umeclidin-Vilant (TRELEGY ELLIPTA) 100-62.5-25 MCG/INH AEPB Inhale 1 puff into the lungs daily. 60 each 2  . furosemide (LASIX) 20 MG tablet Take 20 mg by mouth daily.  1  . gabapentin (NEURONTIN) 300 MG capsule Take 1 capsule by mouth 2 (two) times daily.    Marland Kitchen glimepiride (AMARYL) 2 MG tablet Take 2 mg by mouth daily with breakfast.    . HYDROcodone-acetaminophen (NORCO) 10-325 MG tablet Take 0.5-1 tablet by mouth every 12 hours as needed for pain    . Insulin  Glargine (LANTUS SOLOSTAR) 100 UNIT/ML Solostar Pen Inject 65 Units into the skin at bedtime.     . metFORMIN (GLUCOPHAGE) 500 MG tablet Take 1,000 mg by mouth 2 (two) times daily with a meal.     . Multiple Vitamins-Minerals (MULTIVITAMIN WITH MINERALS) tablet Take 1 tablet by mouth daily.    . nabumetone (RELAFEN) 750 MG tablet Take 750 mg by mouth 2 (two) times daily.    Marland Kitchen PROAIR HFA 108 (90 Base) MCG/ACT inhaler Take 2 puffs by mouth every 6 (six) hours as needed for wheezing or shortness of breath.   2  . quiNINE (QUALAQUIN) 324 MG capsule Take 1 capsule by mouth every other day.   1  . simvastatin (ZOCOR) 20 MG tablet Take 20 mg by mouth daily at 6 PM.     . SYMBICORT 160-4.5 MCG/ACT inhaler Inhale 1 puff into the lungs 2 (two) times daily. 1 Inhaler 2  . oxyCODONE-acetaminophen (PERCOCET) 10-325 MG tablet Take 1 tablet by mouth every 4 (four) hours as needed for pain. (Patient not taking: Reported on 01/12/2020) 100 tablet 0   No current facility-administered medications on file prior to visit.    There are no Patient Instructions on file for this visit. No follow-ups on file.   Georgiana Spinner, NP

## 2020-01-23 NOTE — Progress Notes (Deleted)
No show

## 2020-01-24 ENCOUNTER — Ambulatory Visit: Payer: Medicare HMO

## 2020-01-25 ENCOUNTER — Ambulatory Visit: Payer: Medicare HMO | Admitting: Pain Medicine

## 2020-01-25 DIAGNOSIS — G8929 Other chronic pain: Secondary | ICD-10-CM

## 2020-01-25 DIAGNOSIS — M961 Postlaminectomy syndrome, not elsewhere classified: Secondary | ICD-10-CM

## 2020-01-25 DIAGNOSIS — Z79899 Other long term (current) drug therapy: Secondary | ICD-10-CM

## 2020-01-25 DIAGNOSIS — G894 Chronic pain syndrome: Secondary | ICD-10-CM

## 2020-01-25 DIAGNOSIS — M5416 Radiculopathy, lumbar region: Secondary | ICD-10-CM

## 2020-01-30 ENCOUNTER — Other Ambulatory Visit: Payer: Self-pay

## 2020-01-30 ENCOUNTER — Ambulatory Visit
Admission: RE | Admit: 2020-01-30 | Discharge: 2020-01-30 | Disposition: A | Discharge status: Home / Self Care | Payer: Medicare HMO | Source: Ambulatory Visit | Attending: Adult Health | Admitting: Adult Health

## 2020-01-30 DIAGNOSIS — I700 Atherosclerosis of aorta: Secondary | ICD-10-CM | POA: Insufficient documentation

## 2020-01-30 DIAGNOSIS — F17201 Nicotine dependence, unspecified, in remission: Secondary | ICD-10-CM

## 2020-01-30 DIAGNOSIS — J449 Chronic obstructive pulmonary disease, unspecified: Secondary | ICD-10-CM | POA: Diagnosis present

## 2020-01-30 DIAGNOSIS — J4489 Other specified chronic obstructive pulmonary disease: Secondary | ICD-10-CM

## 2020-02-03 ENCOUNTER — Other Ambulatory Visit
Admission: RE | Admit: 2020-02-03 | Discharge: 2020-02-03 | Disposition: A | Discharge status: Home / Self Care | Payer: Medicare HMO | Source: Ambulatory Visit | Attending: Vascular Surgery | Admitting: Vascular Surgery

## 2020-02-03 ENCOUNTER — Other Ambulatory Visit: Payer: Self-pay

## 2020-02-03 DIAGNOSIS — Z01812 Encounter for preprocedural laboratory examination: Secondary | ICD-10-CM | POA: Diagnosis present

## 2020-02-03 DIAGNOSIS — Z20822 Contact with and (suspected) exposure to covid-19: Secondary | ICD-10-CM | POA: Diagnosis not present

## 2020-02-04 LAB — SARS CORONAVIRUS 2 (TAT 6-24 HRS): SARS Coronavirus 2: NEGATIVE

## 2020-02-06 ENCOUNTER — Other Ambulatory Visit (INDEPENDENT_AMBULATORY_CARE_PROVIDER_SITE_OTHER): Payer: Self-pay | Admitting: Nurse Practitioner

## 2020-02-07 ENCOUNTER — Other Ambulatory Visit: Payer: Self-pay

## 2020-02-07 ENCOUNTER — Encounter: Payer: Self-pay | Admitting: Certified Registered"

## 2020-02-07 ENCOUNTER — Ambulatory Visit
Admission: RE | Admit: 2020-02-07 | Discharge: 2020-02-07 | Disposition: A | Discharge status: Home / Self Care | Payer: Medicare HMO | Attending: Vascular Surgery | Admitting: Vascular Surgery

## 2020-02-07 ENCOUNTER — Encounter: Payer: Self-pay | Admitting: Vascular Surgery

## 2020-02-07 ENCOUNTER — Encounter
Admission: RE | Disposition: A | Discharge status: Home / Self Care | Payer: Self-pay | Source: Home / Self Care | Attending: Vascular Surgery

## 2020-02-07 DIAGNOSIS — F17210 Nicotine dependence, cigarettes, uncomplicated: Secondary | ICD-10-CM | POA: Insufficient documentation

## 2020-02-07 DIAGNOSIS — E1151 Type 2 diabetes mellitus with diabetic peripheral angiopathy without gangrene: Secondary | ICD-10-CM | POA: Diagnosis present

## 2020-02-07 DIAGNOSIS — Z794 Long term (current) use of insulin: Secondary | ICD-10-CM | POA: Insufficient documentation

## 2020-02-07 DIAGNOSIS — I70219 Atherosclerosis of native arteries of extremities with intermittent claudication, unspecified extremity: Secondary | ICD-10-CM

## 2020-02-07 DIAGNOSIS — I70222 Atherosclerosis of native arteries of extremities with rest pain, left leg: Secondary | ICD-10-CM

## 2020-02-07 DIAGNOSIS — I10 Essential (primary) hypertension: Secondary | ICD-10-CM | POA: Diagnosis not present

## 2020-02-07 DIAGNOSIS — Z79899 Other long term (current) drug therapy: Secondary | ICD-10-CM | POA: Diagnosis not present

## 2020-02-07 DIAGNOSIS — E785 Hyperlipidemia, unspecified: Secondary | ICD-10-CM | POA: Insufficient documentation

## 2020-02-07 HISTORY — PX: LOWER EXTREMITY ANGIOGRAPHY: CATH118251

## 2020-02-07 LAB — GLUCOSE, CAPILLARY
Glucose-Capillary: 86 mg/dL (ref 70–99)
Glucose-Capillary: 135 mg/dL — ABNORMAL HIGH (ref 70–99)
Glucose-Capillary: 52 mg/dL — ABNORMAL LOW (ref 70–99)
Glucose-Capillary: 139 mg/dL — ABNORMAL HIGH (ref 70–99)

## 2020-02-07 LAB — CREATININE, SERUM
Creatinine, Ser: 1.23 mg/dL (ref 0.61–1.24)
GFR, Estimated: >60 mL/min (ref >60)

## 2020-02-07 LAB — BUN: BUN: 21 mg/dL (ref 8–23)

## 2020-02-07 SURGERY — LOWER EXTREMITY ANGIOGRAPHY
Anesthesia: Moderate Sedation | Laterality: Left

## 2020-02-07 MED ORDER — CLOPIDOGREL BISULFATE 300 MG PO TABS
300.0000 mg | ORAL_TABLET | ORAL | Status: AC
  Administered 2020-02-07: 300 mg via ORAL

## 2020-02-07 MED ORDER — FAMOTIDINE 20 MG PO TABS: 40.0000 mg | ORAL_TABLET | Freq: Once | ORAL | Status: DC | PRN

## 2020-02-07 MED ORDER — CLOPIDOGREL BISULFATE 75 MG PO TABS
75.0000 mg | ORAL_TABLET | Freq: Every day | ORAL | 5 refills | Status: DC
Start: 2020-02-07 — End: 2020-07-24

## 2020-02-07 MED ORDER — SODIUM CHLORIDE 0.9 % IV SOLN: INTRAVENOUS | Status: DC

## 2020-02-07 MED ORDER — MORPHINE SULFATE (PF) 4 MG/ML IV SOLN: 2.0000 mg | INTRAVENOUS | Status: DC | PRN

## 2020-02-07 MED ORDER — ASPIRIN 325 MG PO TABS
325.0000 mg | ORAL_TABLET | ORAL | Status: DC
  Filled 2020-02-07: qty 1

## 2020-02-07 MED ORDER — SODIUM CHLORIDE 0.9% FLUSH: 3.0000 mL | INTRAVENOUS | Status: DC | PRN

## 2020-02-07 MED ORDER — HYDRALAZINE HCL 20 MG/ML IJ SOLN: 5.0000 mg | INTRAMUSCULAR | Status: DC | PRN

## 2020-02-07 MED ORDER — ACETAMINOPHEN 325 MG PO TABS: 650.0000 mg | ORAL_TABLET | ORAL | Status: DC | PRN

## 2020-02-07 MED ORDER — LABETALOL HCL 5 MG/ML IV SOLN: 10.0000 mg | INTRAVENOUS | Status: DC | PRN

## 2020-02-07 MED ORDER — HEPARIN SODIUM (PORCINE) 1000 UNIT/ML IJ SOLN
INTRAMUSCULAR | Status: DC | PRN
  Administered 2020-02-07: 5000 [IU] via INTRAVENOUS

## 2020-02-07 MED ORDER — MIDAZOLAM HCL 2 MG/ML PO SYRP: 8.0000 mg | ORAL_SOLUTION | Freq: Once | ORAL | Status: DC | PRN

## 2020-02-07 MED ORDER — MIDAZOLAM HCL 5 MG/5ML IJ SOLN
INTRAMUSCULAR | Status: AC
  Filled 2020-02-07: qty 5

## 2020-02-07 MED ORDER — CEFAZOLIN SODIUM-DEXTROSE 2-4 GM/100ML-% IV SOLN
INTRAVENOUS | Status: AC
  Filled 2020-02-07: qty 100

## 2020-02-07 MED ORDER — METHYLPREDNISOLONE SODIUM SUCC 125 MG IJ SOLR: 125.0000 mg | Freq: Once | INTRAMUSCULAR | Status: DC | PRN

## 2020-02-07 MED ORDER — HYDROMORPHONE HCL 1 MG/ML IJ SOLN: 1.0000 mg | Freq: Once | INTRAMUSCULAR | Status: DC | PRN

## 2020-02-07 MED ORDER — ASPIRIN EC 81 MG PO TBEC: 81.0000 mg | DELAYED_RELEASE_TABLET | Freq: Every day | ORAL | 2 refills | Status: AC

## 2020-02-07 MED ORDER — ASPIRIN EC 325 MG PO TBEC
DELAYED_RELEASE_TABLET | ORAL | Status: AC
  Administered 2020-02-07: 325 mg
  Filled 2020-02-07: qty 1

## 2020-02-07 MED ORDER — CLOPIDOGREL BISULFATE 300 MG PO TABS
ORAL_TABLET | ORAL | Status: AC
  Filled 2020-02-07: qty 1

## 2020-02-07 MED ORDER — FENTANYL CITRATE (PF) 100 MCG/2ML IJ SOLN
INTRAMUSCULAR | Status: AC
  Filled 2020-02-07: qty 2

## 2020-02-07 MED ORDER — CEFAZOLIN SODIUM-DEXTROSE 2-4 GM/100ML-% IV SOLN
2.0000 g | Freq: Once | INTRAVENOUS | Status: AC
  Administered 2020-02-07: 2 g via INTRAVENOUS

## 2020-02-07 MED ORDER — HEPARIN SODIUM (PORCINE) 1000 UNIT/ML IJ SOLN
INTRAMUSCULAR | Status: AC
  Filled 2020-02-07: qty 1

## 2020-02-07 MED ORDER — SODIUM CHLORIDE 0.9% FLUSH: 3.0000 mL | Freq: Two times a day (BID) | INTRAVENOUS | Status: DC

## 2020-02-07 MED ORDER — OXYCODONE HCL 5 MG PO TABS: 5.0000 mg | ORAL_TABLET | ORAL | Status: DC | PRN

## 2020-02-07 MED ORDER — IODIXANOL 320 MG/ML IV SOLN
INTRAVENOUS | Status: DC | PRN
  Administered 2020-02-07: 70 mL via INTRA_ARTERIAL

## 2020-02-07 MED ORDER — FENTANYL CITRATE (PF) 100 MCG/2ML IJ SOLN
INTRAMUSCULAR | Status: DC | PRN
  Administered 2020-02-07 (×2): 50 ug via INTRAVENOUS

## 2020-02-07 MED ORDER — ONDANSETRON HCL 4 MG/2ML IJ SOLN: 4.0000 mg | Freq: Four times a day (QID) | INTRAMUSCULAR | Status: DC | PRN

## 2020-02-07 MED ORDER — DEXTROSE 50 % IV SOLN: 50.0000 mL | Freq: Once | INTRAVENOUS | Status: DC

## 2020-02-07 MED ORDER — SODIUM CHLORIDE 0.9 % IV SOLN: 250.0000 mL | INTRAVENOUS | Status: DC | PRN

## 2020-02-07 MED ORDER — DIPHENHYDRAMINE HCL 50 MG/ML IJ SOLN: 50.0000 mg | Freq: Once | INTRAMUSCULAR | Status: DC | PRN

## 2020-02-07 MED ORDER — MIDAZOLAM HCL 2 MG/2ML IJ SOLN
INTRAMUSCULAR | Status: DC | PRN
  Administered 2020-02-07: 1 mg via INTRAVENOUS
  Administered 2020-02-07: 2 mg via INTRAVENOUS

## 2020-02-07 MED ORDER — DEXTROSE 50 % IV SOLN
INTRAVENOUS | Status: AC
  Administered 2020-02-07: 50 mL via INTRAVENOUS
  Filled 2020-02-07: qty 50

## 2020-02-07 SURGICAL SUPPLY — 14 items
BALLN LUTONIX AV 8X40X75 (BALLOONS) ×3
BALLN LUTONIX DCB 4X60X130 (BALLOONS) ×3
CATH ANGIO 5F PIGTAIL 65CM (CATHETERS) ×3
COVER PROBE U/S 5X48 (MISCELLANEOUS) ×3
DEVICE STARCLOSE SE CLOSURE (Vascular Products) ×3 IMPLANT
GLIDEWIRE ADV .035X260CM (WIRE) ×3
KIT ENCORE 26 ADVANTAGE (KITS) ×3
NEEDLE ENTRY 21GA 7CM ECHOTIP (NEEDLE) ×6
PACK ANGIOGRAPHY (CUSTOM PROCEDURE TRAY) ×3
SET INTRO CAPELLA COAXIAL (SET/KITS/TRAYS/PACK) ×6
SHEATH BALKIN 7FR (SHEATH) ×3
SHEATH BRITE TIP 5FRX11 (SHEATH) ×3
STENT LIFESTAR 12X40 (Permanent Stent) ×3 IMPLANT
WIRE J 3MM .035X145CM (WIRE) ×3

## 2020-02-07 NOTE — Op Note (Signed)
OPERATIVE NOTE   PROCEDURE: 1. Left lower extremity angiography third order catheter placement 2. Percutaneous transluminal angioplasty and stent placement left common iliac artery with a 12 x 40 life star stent 3. Ultrasound-guided access right common femoral artery for sheath placement  PRE-OPERATIVE DIAGNOSIS: Atherosclerotic occlusive disease bilateral lower extremities with lifestyle limiting claudication and rest pain of the left lower extremity.  POST-OPERATIVE DIAGNOSIS: Same  SURGEON: Katha Cabal, M.D.  ANESTHESIA: Conscious sedation was administered under my direct supervision by the interventional radiology RN. IV Versed plus fentanyl were utilized. Continuous ECG, pulse oximetry and blood pressure was monitored throughout the entire procedure.  Conscious sedation was for a total of 60 minutes.  ESTIMATED BLOOD LOSS: Minimal cc  FINDING(S): 1.  Diffuse atherosclerotic changes of the left lower extremity, greater than 90% stenosis of the distal left common iliac artery three-vessel runoff to the foot beyond this lesion  SPECIMEN(S):  None  INDICATIONS:   Darin Waters is a 59 y.o. y.o. male who presents with signs of sepsis secondary to infection of the right heel ulcer. He has nonpalpable pulses in association with increasing pain of the left lower extremity.  He feels this is causing him severe disability and is requesting treatment.  DESCRIPTION: After obtaining full informed written consent, the patient was brought back to the operating room and placed supine upon the operating table.  The patient received IV antibiotics prior to induction.  After obtaining adequate sedation, the patient was prepped and draped in the standard fashion and appropriate time out is called.    Ultrasound is placed in a sterile sleeve ultrasound as utilized secondary to lack of appropriate landmarks and to avoid vascular injury. Under real-time visualization the right common femoral  artery is identified is echolucent and pulsatile indicating patency. Image is recorded for the permanent record. 1% lidocaine is then infiltrated into the soft tissues with ultrasound visualization. Microneedle is then inserted into the anterior wall of the common femoral artery under direct visualization with ultrasound. Microwire followed by micro-sheath.   J-wire followed by 5 French 11 cm Pinnacle sheath is then inserted without difficulty.  J-wire is then advanced with pigtail catheter up to the level of T12 and AP projection of the aorta is obtained. Pigtail catheter was repositioned to above the bifurcation and oblique view of the pelvis is obtained.  Using a Stiff angled Glide Wire wire and the pigtail catheter the aortic bifurcation was crossed and the catheter is advanced down to the external iliac artery. Oblique view of the femoral bifurcation is then obtained by hand injection. The wire is then reintroduced and the catheter is positioned within the SFA. AP projections of the left lower extremity are obtained for distal runoff.  After review the images distal images are not adequate and the wire is reintroduced a 7 [place] sheath is advanced up and over the bifurcation and positioned with its tip in the proximal common iliac artery.  Initially a 4 mm x 60 mm Lutonix drug-eluting balloon was advanced across the lesion.  Inflation was to 12 atm for 1 minute.  After appropriate follow-up imaging and then measurements using the 60 mm balloon markers to calibrate a 12 mm x 40 mm life star stent was advanced across the lesion and deployed.  Was postdilated with an 8 mm x 40 mm Lutonix drug-eluting balloon inflated to 14 atm for 1 minute.  Follow-up imaging demonstrated less than 10% residual stenosis imaging was performed in both LAO and RAO  projections.  After review of the images the catheter is removed sheath is pulled back into the left iliac system J-wire is then advanced and socially  and LAO projection of the groin is obtained after review of this image a Star Cose device is deployed without difficulty.  INTERPRETATION: The abdominal aorta is opacified with a bolus injection of contrast. There is diffuse atherosclerotic changes clearly visible even under fluoroscopy without contrast enhancement. However, there are no hemodynamically significant lesions noted within the aorta, the left common iliac artery demonstrates a greater than 90% stenosis in its distal portion there is poststenotic dilatation of the proximal external iliac artery.  The internal iliac artery is a subtotal occlusion at its origin.  The left common femoral and profunda femoris arteries are widely patent. The left superficial femoral artery demonstrates diffuse disease as does the popliteal but there are no hemodynamically significant lesions noted. The trifurcation is patent three-vessel runoff to the foot and is patent down filling the lateral plantar and the pedal arch.  Following angioplasty and stent placement there is less than 10% residual stenosis in the common iliac artery.  COMPLICATIONS: There were no immediate consultations.  CONDITION: Darin Waters, M.D. [facility] Office: [phone]   02/07/2020,9:28 AM

## 2020-02-07 NOTE — Discharge Instructions (Signed)
Angiogram, Care After This sheet gives you information about how to care for yourself after your procedure. Your doctor may also give you more specific instructions. If you have problems or questions, contact your doctor. Follow these instructions at home: Insertion site care  Follow instructions from your doctor about how to take care of your long, thin tube (catheter) insertion area. Make sure you: ? Wash your hands with soap and water before you change your bandage (dressing). If you cannot use soap and water, use hand sanitizer. ? Change your bandage as told by your doctor. ? Leave stitches (sutures), skin glue, or skin tape (adhesive) strips in place. They may need to stay in place for 2 weeks or longer. If tape strips get loose and curl up, you may trim the loose edges. Do not remove tape strips completely unless your doctor says it is okay.  Do not take baths, swim, or use a hot tub until your doctor says it is okay.  You may shower 24-48 hours after the procedure or as told by your doctor. ? Gently wash the area with plain soap and water. ? Pat the area dry with a clean towel. ? Do not rub the area. This may cause bleeding.  Do not apply powder or lotion to the area. Keep the area clean and dry.  Check your insertion area every day for signs of infection. Check for: ? More redness, swelling, or pain. ? Fluid or blood. ? Warmth. ? Pus or a bad smell. Activity  Rest as told by your doctor, usually for 1-2 days.  Do not lift anything that is heavier than 10 lbs. (4.5 kg) or as told by your doctor.  Do not drive for 24 hours if you were given a medicine to help you relax (sedative).  Do not drive or use heavy machinery while taking prescription pain medicine. General instructions   Go back to your normal activities as told by your doctor, usually in about a week. Ask your doctor what activities are safe for you.  If the insertion area starts to bleed, lie flat and put  pressure on the area. If the bleeding does not stop, get help right away. This is an emergency.  Drink enough fluid to keep your pee (urine) clear or pale yellow.  Take over-the-counter and prescription medicines only as told by your doctor.  Keep all follow-up visits as told by your doctor. This is important. Contact a doctor if:  You have a fever.  You have chills.  You have more redness, swelling, or pain around your insertion area.  You have fluid or blood coming from your insertion area.  The insertion area feels warm to the touch.  You have pus or a bad smell coming from your insertion area.  You have more bruising around the insertion area.  Blood collects in the tissue around the insertion area (hematoma) that may be painful to the touch. Get help right away if:  You have a lot of pain in the insertion area.  The insertion area swells very fast.  The insertion area is bleeding, and the bleeding does not stop after holding steady pressure on the area.  The area near or just beyond the insertion area becomes pale, cool, tingly, or numb. These symptoms may be an emergency. Do not wait to see if the symptoms will go away. Get medical help right away. Call your local emergency services (911 in the [place] Do not drive yourself to the hospital.   Summary  After the procedure, it is common to have bruising and tenderness at the long, thin tube insertion area.  After the procedure, it is important to rest and drink plenty of fluids.  Do not take baths, swim, or use a hot tub until your doctor says it is okay to do so. You may shower 24-48 hours after the procedure or as told by your doctor.  If the insertion area starts to bleed, lie flat and put pressure on the area. If the bleeding does not stop, get help right away. This is an emergency. This information is not intended to replace advice given to you by your health care provider. Make sure you discuss any questions you have  with your health care provider. Document Revised: 02/27/2017 Document Reviewed: 03/11/2016 Elsevier Patient Education  2020 [employer] Femoral Site Care This sheet gives you information about how to care for yourself after your procedure. Your health care provider may also give you more specific instructions. If you have problems or questions, contact your health care provider. What can I expect after the procedure? After the procedure, it is common to have:  Bruising that usually fades within 1-2 weeks.  Tenderness at the site. Follow these instructions at home: Wound care  Follow instructions from your health care provider about how to take care of your insertion site. Make sure you: ? Wash your hands with soap and water before you change your bandage (dressing). If soap and water are not available, use hand sanitizer. ? Change your dressing as told by your health care provider. ? Leave stitches (sutures), skin glue, or adhesive strips in place. These skin closures may need to stay in place for 2 weeks or longer. If adhesive strip edges start to loosen and curl up, you may trim the loose edges. Do not remove adhesive strips completely unless your health care provider tells you to do that.  Do not take baths, swim, or use a hot tub until your health care provider approves.  You may shower 24-48 hours after the procedure or as told by your health care provider. ? Gently wash the site with plain soap and water. ? Pat the area dry with a clean towel. ? Do not rub the site. This may cause bleeding.  Do not apply powder or lotion to the site. Keep the site clean and dry.  Check your femoral site every day for signs of infection. Check for: ? Redness, swelling, or pain. ? Fluid or blood. ? Warmth. ? Pus or a bad smell. Activity  For the first 2-3 days after your procedure, or as long as directed: ? Avoid climbing stairs as much as possible. ? Do not squat.  Do not lift anything  that is heavier than 10 lb (4.5 kg), or the limit that you are told, until your health care provider says that it is safe.  Rest as directed. ? Avoid sitting for a long time without moving. Get up to take short walks every 1-2 hours.  Do not drive for 24 hours if you were given a medicine to help you relax (sedative). General instructions  Take over-the-counter and prescription medicines only as told by your health care provider.  Keep all follow-up visits as told by your health care provider. This is important. Contact a health care provider if you have:  A fever or chills.  You have redness, swelling, or pain around your insertion site. Get help right away if:  The catheter insertion area  swells very fast.  You pass out.  You suddenly start to sweat or your skin gets clammy.  The catheter insertion area is bleeding, and the bleeding does not stop when you hold steady pressure on the area.  The area near or just beyond the catheter insertion site becomes pale, cool, tingly, or numb. These symptoms may represent a serious problem that is an emergency. Do not wait to see if the symptoms will go away. Get medical help right away. Call your local emergency services (911 in the [place] Do not drive yourself to the hospital. Summary  After the procedure, it is common to have bruising that usually fades within 1-2 weeks.  Check your femoral site every day for signs of infection.  Do not lift anything that is heavier than 10 lb (4.5 kg), or the limit that you are told, until your health care provider says that it is safe. This information is not intended to replace advice given to you by your health care provider. Make sure you discuss any questions you have with your health care provider. Document Revised: 03/30/2017 Document Reviewed: 03/30/2017 Elsevier Patient Education  2020 [employer].

## 2020-02-07 NOTE — Interval H&P Note (Signed)
History and Physical Interval Note:  02/07/2020 8:00 AM  Darin Waters  has presented today for surgery, with the diagnosis of LT lower extremity angio   ASO w claudication Covid  Nov 5.  The various methods of treatment have been discussed with the patient and family. After consideration of risks, benefits and other options for treatment, the patient has consented to  Procedure(s): LOWER EXTREMITY ANGIOGRAPHY (Left) as a surgical intervention.  The patient's history has been reviewed, patient examined, no change in status, stable for surgery.  I have reviewed the patient's chart and labs.  Questions were answered to the patient's satisfaction.     Levora Dredge

## 2020-02-09 ENCOUNTER — Telehealth (INDEPENDENT_AMBULATORY_CARE_PROVIDER_SITE_OTHER): Payer: Self-pay | Admitting: Vascular Surgery

## 2020-02-09 NOTE — Telephone Encounter (Signed)
In 4 weeks...abi and aorta illiac me or [facility]

## 2020-02-09 NOTE — Telephone Encounter (Signed)
Wife called to set up F/U visit but there is nothing in the discharge summary for me to schedule. Patient had and LE Angio ([facility]) 02-07-20. Please advise.

## 2020-02-14 NOTE — Progress Notes (Signed)
PROVIDER NOTE: Information contained herein reflects review and annotations entered in association with encounter. Interpretation of such information and data should be left to medically-trained personnel. Information provided to patient can be located elsewhere in the medical record under "Patient Instructions". Document created using STT-dictation technology, any transcriptional errors that may result from process are unintentional.    Patient: Darin Waters  Service Category: E/M  Provider: Gaspar Cola, MD  DOB: [birth year]  DOS: 02/15/2020  Specialty: Interventional Pain Management  MRN: [id number]  Setting: Ambulatory outpatient  PCP: Sallee Lange, NP  Type: Established Patient    Referring Provider: Sallee Lange, *  Location: Office  Delivery: Face-to-face     HPI  [name], a 59 y.o. year old male, is here today because of his Chronic pain syndrome [G89.4]. Darin Waters primary complain today is Back Pain (lumbar left ) Last encounter: My last encounter with him was on 01/25/2020. Pertinent problems: Darin Waters has Cervical herniated disc; Spondylolisthesis of lumbar region; Chronic pain syndrome; DDD (degenerative disc disease), lumbar; Lumbar radiculitis (S1 dermatome) (Bilateral) (L>R); Chronic low back pain (2ry area of Pain) (Bilateral) (L>R) w/ sciatica (Bilateral); Failed back surgical syndrome; Chronic lower extremity pain (1ry area of Pain) (Bilateral) (L>R); PAD (peripheral artery disease) ([place]); Lumbar facet syndrome (Left); and Chronic sacroiliac joint pain (Left) on their pertinent problem list. Pain Assessment: Severity of Chronic pain is reported as a 0-No pain/10. Location: Back Lower, Left/into hip and leg on the left. Onset: More than a month ago. Quality: Discomfort, Constant, Dull. Timing: Constant. Modifying factor(s): sitting down. Vitals:  height is _0  (1.676 m) and weight is 220 lb (99.8 kg). His temporal temperature is 97.2 F  (36.2 C) (abnormal). His blood pressure is 133/61 and his pulse is 67. His respiration is 16 and oxygen saturation is 95%.   Reason for encounter: post-procedure assessment.  The patient recently underwent a second caudal epidural steroid injection under fluoroscopic guidance and IV sedation.  This provided him with 90% relief of the low back and leg pain for the duration of the local anesthetic.  This 90% actually lasted for an additional week.  However, his back pain has returned.  However, the leg pain is completely gone on both legs.  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  He refers that the pain on the left leg is now also gone.  Since that recent surgery which occurred a couple weeks ago, he was also started on Plavix.  In order to do any type of interventional therapy will we will need to get clearance to stop the Plavix for at least 7 days prior to the procedure.  If any chance he cannot stop it for now, then we will need to wait until he can.  An alternative would be to have the prescribing physician to a Lovenox bridge in order to have a any other interventional therapy.  Since the patient indicated that the only pain that he is experiencing right now is that of the left lower back.  Today we have reevaluated this.  He has decreased range of motion of the lumbar spine because of his lumbar fusion, but hyperextension on rotation did reproduce some of the pain that he was experiencing on the left side.  Provocative testing also demonstrated bilateral SI joint arthralgia,  with the left side being significantly worse than the right side.  In view of this, we have decided to offer the patient a diagnostic left lumbar facet block and a diagnostic left SI joint block under fluoroscopic guidance and IV sedation to see if this is worse some of this pain is coming  from.  The plan was presented to the patient who understood and accepted.        Post-Procedure Evaluation  Procedure (01/25/2020): Diagnostic left caudal ESI #2 + diagnostic epidurogram #2 under fluoroscopic guidance and IV sedation Pre-procedure pain level: 0/10 Post-procedure: 0/10          Sedation: Sedation provided.  Effectiveness during initial hour after procedure(Ultra-Short Term Relief): 90 %.  Local anesthetic used: Long-acting (4-6 hours) Effectiveness: Defined as any analgesic benefit obtained secondary to the administration of local anesthetics. This carries significant diagnostic value as to the etiological location, or anatomical origin, of the pain. Duration of benefit is expected to coincide with the duration of the local anesthetic used.  Effectiveness during initial 4-6 hours after procedure(Short-Term Relief): 90 %.  Long-term benefit: Defined as any relief past the pharmacologic duration of the local anesthetics.  Effectiveness past the initial 6 hours after procedure(Long-Term Relief): 90 %.  Current benefits: Defined as benefit that persist at this time.   Analgesia:  100% better.  The patient indicates having 100% relief of the leg pain.  The only pain that he is having at this point is that of the low back on the left side. Function: Darin Waters reports improvement in function ROM: Darin Waters reports improvement in ROM  Pharmacotherapy Assessment   Analgesic: Hydrocodone/APAP 10/325 1 tablet p.o. twice daily (20 mg/day of hydrocodone) (20 MME/day)  MME/day: 20 mg/day   Monitoring: [place] PMP: PDMP reviewed during this encounter.       Pharmacotherapy: No side-effects or adverse reactions reported. Compliance: No problems identified. Effectiveness: Clinically acceptable.  Janett Billow, RN  02/15/2020  8:58 AM  Sign when Signing Visit Safety precautions to be maintained throughout the outpatient stay will include: orient to surroundings, keep bed in  low position, maintain call bell within reach at all times, provide assistance with transfer out of bed and ambulation.     UDS:  Summary  Date Value Ref Range Status  10/10/2019 Note  Final    Comment:    ==================================================================== Compliance Drug Analysis, Ur ==================================================================== Test                             Result       Flag       Units  Drug Present and Declared for Prescription Verification   Gabapentin                     PRESENT      EXPECTED   Citalopram                     PRESENT      EXPECTED   Desmethylcitalopram            PRESENT      EXPECTED    Desmethylcitalopram is an expected metabolite of citalopram or the    enantiomeric form, escitalopram.    Acetaminophen                  PRESENT      EXPECTED  Drug Present not Declared for Prescription Verification  Hydrocodone                    2963         UNEXPECTED ng/mg creat   Hydromorphone                  135          UNEXPECTED ng/mg creat   Dihydrocodeine                 393          UNEXPECTED ng/mg creat   Norhydrocodone                 1000         UNEXPECTED ng/mg creat    Sources of hydrocodone include scheduled prescription medications.    Hydromorphone, dihydrocodeine and norhydrocodone are expected    metabolites of hydrocodone. Hydromorphone and dihydrocodeine are    also available as scheduled prescription medications.    Butorphanol                    PRESENT      UNEXPECTED  Drug Absent but Declared for Prescription Verification   Oxycodone                      Not Detected UNEXPECTED ng/mg creat   Cyclobenzaprine                Not Detected UNEXPECTED   Salicylate                     Not Detected UNEXPECTED    Aspirin, as indicated in the declared medication list, is not always    detected even when used as directed.  ==================================================================== Test                       Result    Flag   Units      Ref Range   Creatinine              185              mg/dL      >=20 ==================================================================== Declared Medications:  The flagging and interpretation on this report are based on the  following declared medications.  Unexpected results may arise from  inaccuracies in the declared medications.   **Note: The testing scope of this panel includes these medications:   Citalopram (Celexa)  Cyclobenzaprine (Flexeril)  Gabapentin (Neurontin)  Oxycodone (Percocet)   **Note: The testing scope of this panel does not include small to  moderate amounts of these reported medications:   Acetaminophen (Percocet)  Aspirin   **Note: The testing scope of this panel does not include the  following reported medications:   Albuterol (Proair HFA)  Amlodipine (Lotrel)  Benazepril (Lotrel)  Budesonide (Symbicort)  Docusate (Colace)  Esomeprazole (Nexium)  Fluticasone (Trelegy)  Formoterol (Symbicort)  Furosemide (Lasix)  Glimepiride (Amaryl)  Insulin  Metformin (Glucophage)  Multivitamin  Quinine (Qualaquin)  Simvastatin (Zocor)  Umeclidinium (Trelegy)  Vilanterol (Trelegy) ==================================================================== For clinical consultation, please call [phone]. ====================================================================      ROS  Constitutional: Denies any fever or chills Gastrointestinal: No reported hemesis, hematochezia, vomiting, or acute GI distress Musculoskeletal: Denies any acute onset joint swelling, redness, loss of ROM, or weakness Neurological: No reported episodes of acute onset apraxia, aphasia, dysarthria, agnosia, amnesia, paralysis, loss of coordination, or loss of consciousness  Medication Review  Fluticasone-Umeclidin-Vilant, HYDROcodone-acetaminophen, albuterol, amLODipine-benazepril, aspirin EC, atorvastatin, citalopram, clopidogrel,  esomeprazole, furosemide, gabapentin, glimepiride, insulin glargine, metFORMIN, multivitamin with minerals, nabumetone, quiNINE, and spironolactone  History Review  Allergy: Darin Waters has No Known Allergies. Drug: Darin Waters  reports no history of drug use. Alcohol:  reports current alcohol use of about 3.0 standard drinks of alcohol per week. Tobacco:  reports that he has been smoking e-cigarettes. He has never used smokeless tobacco. Social: Darin Waters  reports that he has been smoking e-cigarettes. He has never used smokeless tobacco. He reports current alcohol use of about 3.0 standard drinks of alcohol per week. He reports that he does not use drugs. Medical:  has a past medical history of Arthritis, COPD (chronic obstructive pulmonary disease) ([place]), Depression, Diabetes mellitus without complication ([place]), GERD (gastroesophageal reflux disease), History of kidney stones (1990's ), Hypertension, and Sleep apnea (01/30/2016). Surgical: Darin Waters  has a past surgical history that includes Colonoscopy; Rectal polypectomy (4-5 yrs ago); Anterior cervical decomp/discectomy fusion (N/A, 07/18/2015); Esophagogastroduodenoscopy (egd) with propofol (N/A, 08/03/2019); Colonoscopy with propofol (N/A, 08/03/2019); and Lower Extremity Angiography (Left, 02/07/2020). Family: family history includes Asthma in his mother; Dementia in his father; Diabetes in his mother; Heart Problems in his father; Hyperlipidemia in his father and mother; Hypertension in his father and mother.  Laboratory Chemistry Profile   Renal Lab Results  Component Value Date   BUN 21 02/07/2020   CREATININE 1.23 02/07/2020   BCR 14 10/10/2019   GFRAA 61 10/10/2019   GFRNONAA >60 02/07/2020     Hepatic Lab Results  Component Value Date   AST 30 10/10/2019   ALBUMIN 4.6 10/10/2019   ALKPHOS 59 10/10/2019     Electrolytes Lab Results  Component Value Date   NA 136 10/10/2019   K 5.7 (H) 10/10/2019   CL 100 10/10/2019    CALCIUM 9.8 10/10/2019   MG 2.0 10/10/2019     Bone Lab Results  Component Value Date   25OHVITD1 33 10/10/2019   25OHVITD2 <1.0 10/10/2019   25OHVITD3 33 10/10/2019     Inflammation (CRP: Acute Phase) (ESR: Chronic Phase) Lab Results  Component Value Date   CRP 2 10/10/2019   ESRSEDRATE 16 10/10/2019       Note: Above Lab results reviewed.  Recent Imaging Review  PERIPHERAL VASCULAR CATHETERIZATION See op note Note: Reviewed        Physical Exam  General appearance: Well nourished, well developed, and well hydrated. In no apparent acute distress Mental status: Alert, oriented x 3 (person, place, & time)       Respiratory: No evidence of acute respiratory distress Eyes: PERLA Vitals: BP 133/61 (BP Location: Right Arm, Patient Position: Sitting, Cuff Size: Normal)   Pulse 67   Temp (!) 97.2 F (36.2 C) (Temporal)   Resp 16   Ht _0  (1.676 m)   Wt 220 lb (99.8 kg)   SpO2 95%   BMI 35.51 kg/m  BMI: Estimated body mass index is 35.51 kg/m as calculated from the following:   Height as of this encounter: _1  (1.676 m).   Weight as of this encounter: 220 lb (99.8 kg). Ideal: Ideal body weight: 63.8 kg (140 lb 10.5 oz) Adjusted ideal body weight: 78.2 kg (172 lb 6.3 oz)  Assessment   Status Diagnosis  Controlled Controlled Controlled 1. Chronic pain syndrome   2. Chronic lower extremity pain (1ry area of Pain) (Bilateral) (L>R)   3. Chronic low back pain (2ry area of Pain) (Bilateral) (L>R)  w/ sciatica (Bilateral)   4. Failed back surgical syndrome   5. Lumbar radiculitis (S1 dermatome) (Bilateral) (L>R)   6. Pharmacologic therapy   7. Chronic anticoagulation (Plavix)   8. Lumbar facet syndrome (Left)   9. Chronic sacroiliac joint pain (Left)      Updated Problems: Problem  Lumbar facet syndrome (Left)  Chronic sacroiliac joint pain (Left)  Ddd (Degenerative Disc Disease), Lumbar  Chronic anticoagulation (Plavix)  Aortic Atherosclerosis (Hcc)    Formatting of this note might be different from the original. Noted incidentally on chest CT for lung ca screening.     Plan of Care  Problem-specific:  No problem-specific Assessment & Plan notes found for this encounter.  Darin Waters has a current medication list which includes the following long-term medication(s): amlodipine-benazepril, atorvastatin, citalopram, esomeprazole, furosemide, gabapentin, glimepiride, insulin glargine, metformin, proair hfa, and spironolactone.  Pharmacotherapy (Medications Ordered): No orders of the defined types were placed in this encounter.  Orders:  Orders Placed This Encounter  Procedures  . LUMBAR FACET(MEDIAL BRANCH NERVE BLOCK) MBNB    Standing Status:   Future    Standing Expiration Date:   03/16/2020    Scheduling Instructions:     Procedure: Lumbar facet block (AKA.: Lumbosacral medial branch nerve block)     Side: Left-sided     Level: L3-4, L4-5, & L5-S1 Facets (L2, L3, L4, L5, & S1 Medial Branch Nerves)     Sedation: With Sedation.     Timeframe: ASAA    Order Specific Question:   Where will this procedure be performed?    Answer:   [facility]  . SACROILIAC JOINT INJECTION    Standing Status:   Future    Standing Expiration Date:   03/16/2020    Scheduling Instructions:     Side: Left-sided     Sedation: With Sedation.     Timeframe: ASAP    Order Specific Question:   Where will this procedure be performed?    Answer:   [facility]  . Blood Thinner Instructions to Nursing    If unable to stop, ask if Lovenox-bridge therapy may be possible, and if so, request their assistance in implementing it.    Scheduling Instructions:     Contact the physician prescribing the blood thinner and request clearance to stop it for time period stipulated below.     If approved by prescribing physician, stop Plavix (Clopidogrel) x 7-10 days prior to procedure or surgery.  . Blood Thinner Instructions to Nursing    Always  make sure patient has clearance from prescribing physician to stop blood thinners for interventional therapies. If the patient requires a Lovenox-bridge therapy, make sure arrangements are made to institute it with the assistance of the PCP.    Scheduling Instructions:     Have Darin Waters stop the Plavix (Clopidogrel) x 7-10 days prior to procedure or surgery.   Follow-up plan:   Return for Procedure (w/ sedation): (L) L-FCT BLK #1 + (L) SI BLK #1, (Blood Thinner Protocol).      Interventional management options: Planned, scheduled, and/or pending:    Diagnostic left lumbar facet block #1 + diagnostic left SI joint block #1 under fluoroscopic guidance and IV sedation   Considering:   NOTE: PLAVIX ANTICOAGULATION (Stop: 7-10 days  Restart: 2 hours) Diagnostic left lumbar facet block #1  Diagnostic left SI joint block #1  Therapeutic (ML) caudal ESI #2  Therapeutic Racz procedure #1  Diagnostic bilateral lumbar spinal cord stimulator  trial    PRN Procedures:   Palliative (ML) caudal ESI #2     Recent Visits Date Type Provider Dept  01/10/20 Procedure visit Milinda Pointer, MD [facility]  12/14/19 Telemedicine Milinda Pointer, MD [facility]  11/22/19 Procedure visit Milinda Pointer, MD [facility]  Showing recent visits within past 90 days and meeting all other requirements Today's Visits Date Type Provider Dept  02/15/20 Office Visit Milinda Pointer, MD [facility]  Showing today's visits and meeting all other requirements Future Appointments No visits were found meeting these conditions. Showing future appointments within next 90 days and meeting all other requirements  I discussed the assessment and treatment plan with the patient. The patient was provided an opportunity to ask questions and all were answered. The patient agreed with the plan and demonstrated an understanding of the instructions.  Patient advised to call  back or seek an in-person evaluation if the symptoms or condition worsens.  Duration of encounter: 50 minutes.  Note by: Gaspar Cola, MD Date: 02/15/2020; Time: 9:40 AM

## 2020-02-15 ENCOUNTER — Ambulatory Visit: Payer: Medicare HMO | Attending: Pain Medicine | Admitting: Pain Medicine

## 2020-02-15 ENCOUNTER — Other Ambulatory Visit: Payer: Self-pay

## 2020-02-15 ENCOUNTER — Encounter: Payer: Self-pay | Admitting: Pain Medicine

## 2020-02-15 VITALS — BP 133/61 | HR 67 | Temp 97.2°F | Resp 16 | Ht 66.0 in | Wt 220.0 lb

## 2020-02-15 DIAGNOSIS — M5416 Radiculopathy, lumbar region: Secondary | ICD-10-CM | POA: Insufficient documentation

## 2020-02-15 DIAGNOSIS — M47816 Spondylosis without myelopathy or radiculopathy, lumbar region: Secondary | ICD-10-CM | POA: Insufficient documentation

## 2020-02-15 DIAGNOSIS — M79604 Pain in right leg: Secondary | ICD-10-CM | POA: Diagnosis not present

## 2020-02-15 DIAGNOSIS — G894 Chronic pain syndrome: Secondary | ICD-10-CM | POA: Diagnosis not present

## 2020-02-15 DIAGNOSIS — M5442 Lumbago with sciatica, left side: Secondary | ICD-10-CM | POA: Diagnosis not present

## 2020-02-15 DIAGNOSIS — M533 Sacrococcygeal disorders, not elsewhere classified: Secondary | ICD-10-CM | POA: Diagnosis present

## 2020-02-15 DIAGNOSIS — Z7901 Long term (current) use of anticoagulants: Secondary | ICD-10-CM | POA: Diagnosis present

## 2020-02-15 DIAGNOSIS — Z79899 Other long term (current) drug therapy: Secondary | ICD-10-CM | POA: Insufficient documentation

## 2020-02-15 DIAGNOSIS — M961 Postlaminectomy syndrome, not elsewhere classified: Secondary | ICD-10-CM | POA: Diagnosis present

## 2020-02-15 DIAGNOSIS — M5441 Lumbago with sciatica, right side: Secondary | ICD-10-CM | POA: Diagnosis present

## 2020-02-15 DIAGNOSIS — M79605 Pain in left leg: Secondary | ICD-10-CM

## 2020-02-15 DIAGNOSIS — G8929 Other chronic pain: Secondary | ICD-10-CM | POA: Diagnosis present

## 2020-02-15 NOTE — Progress Notes (Signed)
Safety precautions to be maintained throughout the outpatient stay will include: orient to surroundings, keep bed in low position, maintain call bell within reach at all times, provide assistance with transfer out of bed and ambulation.  

## 2020-02-15 NOTE — Patient Instructions (Addendum)
____________________________________________________________________________________________  Preparing for Procedure with Sedation  Procedure appointments are limited to planned procedures:  No Prescription Refills.  No disability issues will be discussed.  No medication changes will be discussed.  Instructions:  Oral Intake: Do not eat or drink anything for at least 8 hours prior to your procedure. (Exception: Blood Pressure Medication. See below.)  Transportation: Unless otherwise stated by your physician, you may drive yourself after the procedure.  Blood Pressure Medicine: Do not forget to take your blood pressure medicine with a sip of water the morning of the procedure. If your Diastolic (lower reading)is above 100 mmHg, elective cases will be cancelled/rescheduled.  Blood thinners: These will need to be stopped for procedures. Notify our staff if you are taking any blood thinners. Depending on which one you take, there will be specific instructions on how and when to stop it.  Diabetics on insulin: Notify the staff so that you can be scheduled 1st case in the morning. If your diabetes requires high dose insulin, take only  of your normal insulin dose the morning of the procedure and notify the staff that you have done so.  Preventing infections: Shower with an antibacterial soap the morning of your procedure.  Build-up your immune system: Take 1000 mg of Vitamin C with every meal (3 times a day) the day prior to your procedure.  Antibiotics: Inform the staff if you have a condition or reason that requires you to take antibiotics before dental procedures.  Pregnancy: If you are pregnant, call and cancel the procedure.  Sickness: If you have a cold, fever, or any active infections, call and cancel the procedure.  Arrival: You must be in the facility at least 30 minutes prior to your scheduled procedure.  Children: Do not bring children with you.  Dress appropriately:  Bring dark clothing that you would not mind if they get stained.  Valuables: Do not bring any jewelry or valuables.  Reasons to call and reschedule or cancel your procedure: (Following these recommendations will minimize the risk of a serious complication.)  Surgeries: Avoid having procedures within 2 weeks of any surgery. (Avoid for 2 weeks before or after any surgery).  Flu Shots: Avoid having procedures within 2 weeks of a flu shots or . (Avoid for 2 weeks before or after immunizations).  Barium: Avoid having a procedure within 7-10 days after having had a radiological study involving the use of radiological contrast. (Myelograms, Barium swallow or enema study).  Heart attacks: Avoid any elective procedures or surgeries for the initial 6 months after a "Myocardial Infarction" (Heart Attack).  Blood thinners: It is imperative that you stop these medications before procedures. Let us know if you if you take any blood thinner.   Infection: Avoid procedures during or within two weeks of an infection (including chest colds or gastrointestinal problems). Symptoms associated with infections include: Localized redness, fever, chills, night sweats or profuse sweating, burning sensation when voiding, cough, congestion, stuffiness, runny nose, sore throat, diarrhea, nausea, vomiting, cold or Flu symptoms, recent or current infections. It is specially important if the infection is over the area that we intend to treat.  Heart and lung problems: Symptoms that may suggest an active cardiopulmonary problem include: cough, chest pain, breathing difficulties or shortness of breath, dizziness, ankle swelling, uncontrolled high or unusually low blood pressure, and/or palpitations. If you are experiencing any of these symptoms, cancel your procedure and contact your primary care physician for an evaluation.  Remember:  Regular Business hours are:  Monday to Thursday 8:00 AM to 4:00 PM  Provider's  Schedule: Delano Metz, MD:  Procedure days: Tuesday and Thursday 7:30 AM to 4:00 PM  Edward Jolly, MD:  Procedure days: Monday and Wednesday 7:30 AM to 4:00 PM ____________________________________________________________________________________________   ____________________________________________________________________________________________  Blood Thinners  IMPORTANT NOTICE:  If you take any of these, make sure to notify the nursing staff.  Failure to do so may result in injury.  Recommended time intervals to stop and restart blood-thinners, before & after invasive procedures  Generic Name Brand Name Stop Time. Must be stopped at least this long before procedures. After procedures, wait at least this long before re-starting.  Abciximab Reopro 15 days 2 hrs  Alteplase Activase 10 days 10 days  Anagrelide Agrylin    Apixaban Eliquis 3 days 6 hrs  Cilostazol Pletal 3 days 5 hrs  Clopidogrel Plavix 7-10 days 2 hrs  Dabigatran Pradaxa 5 days 6 hrs  Dalteparin Fragmin 24 hours 4 hrs  Dipyridamole Aggrenox 11days 2 hrs  Edoxaban Lixiana; Savaysa 3 days 2 hrs  Enoxaparin  Lovenox 24 hours 4 hrs  Eptifibatide Integrillin 8 hours 2 hrs  Fondaparinux  Arixtra 72 hours 12 hrs  Prasugrel Effient 7-10 days 6 hrs  Reteplase Retavase 10 days 10 days  Rivaroxaban Xarelto 3 days 6 hrs  Ticagrelor Brilinta 5-7 days 6 hrs  Ticlopidine Ticlid 10-14 days 2 hrs  Tinzaparin Innohep 24 hours 4 hrs  Tirofiban Aggrastat 8 hours 2 hrs  Warfarin Coumadin 5 days 2 hrs   Other medications with blood-thinning effects  Product indications Generic (Brand) names Note  Cholesterol Lipitor Stop 4 days before procedure  Blood thinner (injectable) Heparin (LMW or LMWH Heparin) Stop 24 hours before procedure  Cancer Ibrutinib (Imbruvica) Stop 7 days before procedure  Malaria/Rheumatoid Hydroxychloroquine (Plaquenil) Stop 11 days before procedure  Thrombolytics  10 days before or after procedures    Over-the-counter (OTC) Products with blood-thinning effects  Product Common names Stop Time  Aspirin > 325 mg Goody Powders, Excedrin, etc. 11 days  Aspirin ? 81 mg  7 days  Fish oil  4 days  Garlic supplements  7 days  Ginkgo biloba  36 hours  Ginseng  24 hours  NSAIDs Ibuprofen, Naprosyn, etc. 3 days  Vitamin E  4 days   ____________________________________________________________________________________________   Facet Blocks Patient Information  Description: The facets are joints in the spine between the vertebrae.  Like any joints in the body, facets can become irritated and painful.  Arthritis can also effect the facets.  By injecting steroids and local anesthetic in and around these joints, we can temporarily block the nerve supply to them.  Steroids act directly on irritated nerves and tissues to reduce selling and inflammation which often leads to decreased pain.  Facet blocks may be done anywhere along the spine from the neck to the low back depending upon the location of your pain.   After numbing the skin with local anesthetic (like Novocaine), a small needle is passed onto the facet joints under x-ray guidance.  You may experience a sensation of pressure while this is being done.  The entire block usually lasts about 15-25 minutes.   Conditions which may be treated by facet blocks:   Low back/buttock pain  Neck/shoulder pain  Certain types of headaches  Preparation for the injection:  1. Do not eat any solid food or dairy products within 8 hours of your appointment. 2. You may drink clear liquid up to 3 hours before  appointment.  Clear liquids include water, black coffee, juice or soda.  No milk or cream please. 3. You may take your regular medication, including pain medications, with a sip of water before your appointment.  Diabetics should hold regular insulin (if taken separately) and take 1/2 normal NPH dose the morning of the procedure.  Carry some sugar  containing items with you to your appointment. 4. A driver must accompany you and be prepared to drive you home after your procedure. 5. Bring all your current medications with you. 6. An IV may be inserted and sedation may be given at the discretion of the physician. 7. A blood pressure cuff, EKG and other monitors will often be applied during the procedure.  Some patients may need to have extra oxygen administered for a short period. 8. You will be asked to provide medical information, including your allergies and medications, prior to the procedure.  We must know immediately if you are taking blood thinners (like Coumadin/Warfarin) or if you are allergic to IV iodine contrast (dye).  We must know if you could possible be pregnant.  Possible side-effects:   Bleeding from needle site  Infection (rare, may require surgery)  Nerve injury (rare)  Numbness & tingling (temporary)  Difficulty urinating (rare, temporary)  Spinal headache (a headache worse with upright posture)  Light-headedness (temporary)  Pain at injection site (serveral days)  Decreased blood pressure (rare, temporary)  Weakness in arm/leg (temporary)  Pressure sensation in back/neck (temporary)   Call if you experience:   Fever/chills associated with headache or increased back/neck pain  Headache worsened by an upright position  New onset, weakness or numbness of an extremity below the injection site  Hives or difficulty breathing (go to the emergency room)  Inflammation or drainage at the injection site(s)  Severe back/neck pain greater than usual  New symptoms which are concerning to you  Please note:  Although the local anesthetic injected can often make your back or neck feel good for several hours after the injection, the pain will likely return. It takes 3-7 days for steroids to work.  You may not notice any pain relief for at least one week.  If effective, we will often do a series of 2-3  injections spaced 3-6 weeks apart to maximally decrease your pain.  After the initial series, you may be a candidate for a more permanent nerve block of the facets.  If you have any questions, please call [phone]  [facility] In[facility] Patient Information  Description: The sacroiliac joint connects the scrum (very low back and tailbone) to the ilium (a pelvic bone which also forms half of the hip joint).  Normally this joint experiences very little motion.  When this joint becomes inflamed or unstable low back and or hip and pelvis pain may result.  Injection of this joint with local anesthetics (numbing medicines) and steroids can provide diagnostic information and reduce pain.  This injection is performed with the aid of x-ray guidance into the tailbone area while you are lying on your stomach.   You may experience an electrical sensation down the leg while this is being done.  You may also experience numbness.  We also may ask if we are reproducing your normal pain during the injection.  Conditions which may be treated SI injection:   Low back, buttock, hip or leg pain  Preparation for the Injection:  1. Do not eat any solid food or dairy products within  8 hours of your appointment.  2. You may drink clear liquids up to 3 hours before appointment.  Clear liquids include water, black coffee, juice or soda.  No milk or cream please. 3. You may take your regular medications, including pain medications with a sip of water before your appointment.  Diabetics should hold regular insulin (if take separately) and take 1/2 normal NPH dose the morning of the procedure.  Carry some sugar containing items with you to your appointment. 4. A driver must accompany you and be prepared to drive you home after your procedure. 5. Bring all of your current medications with you. 6. An IV may be inserted and sedation may be given at the discretion of the  physician. 7. A blood pressure cuff, EKG and other monitors will often be applied during the procedure.  Some patients may need to have extra oxygen administered for a short period.  8. You will be asked to provide medical information, including your allergies, prior to the procedure.  We must know immediately if you are taking blood thinners (like Coumadin/Warfarin) or if you are allergic to IV iodine contrast (dye).  We must know if you could possible be pregnant.  Possible side effects:   Bleeding from needle site  Infection (rare, may require surgery)  Nerve injury (rare)  Numbness & tingling (temporary)  A brief convulsion or seizure  Light-headedness (temporary)  Pain at injection site (several days)  Decreased blood pressure (temporary)  Weakness in the leg (temporary)   Call if you experience:   New onset weakness or numbness of an extremity below the injection site that last more than 8 hours.  Hives or difficulty breathing ( go to the emergency room)  Inflammation or drainage at the injection site  Any new symptoms which are concerning to you  Please note:  Although the local anesthetic injected can often make your back/ hip/ buttock/ leg feel good for several hours after the injections, the pain will likely return.  It takes 3-7 days for steroids to work in the sacroiliac area.  You may not notice any pain relief for at least that one week.  If effective, we will often do a series of three injections spaced 3-6 weeks apart to maximally decrease your pain.  After the initial series, we generally will wait some months before a repeat injection of the same type.  If you have any questions, please call [phone] [facility]

## 2020-02-16 ENCOUNTER — Telehealth: Payer: Self-pay

## 2020-02-16 NOTE — Telephone Encounter (Signed)
Called patient to let him know that his vascular surgeon has denied our request to stop his PLAVIX due to his recent stent placement. He will be eligible to stop it in 3 months per vascular. There was no answer when I called, so if he returns call, please let him know this. Thanks- Victorino Dike

## 2020-02-16 NOTE — Telephone Encounter (Signed)
I have the authorization for the lumbar facets.  Please let me know when you get clearance for him to stop his Plavix. Thanks.

## 2020-02-20 NOTE — Telephone Encounter (Signed)
Left patient a vm informing him of this.

## 2020-03-02 ENCOUNTER — Other Ambulatory Visit (INDEPENDENT_AMBULATORY_CARE_PROVIDER_SITE_OTHER): Payer: Self-pay | Admitting: Nurse Practitioner

## 2020-03-02 DIAGNOSIS — Z95820 Peripheral vascular angioplasty status with implants and grafts: Secondary | ICD-10-CM

## 2020-03-02 DIAGNOSIS — I739 Peripheral vascular disease, unspecified: Secondary | ICD-10-CM

## 2020-03-08 ENCOUNTER — Ambulatory Visit (INDEPENDENT_AMBULATORY_CARE_PROVIDER_SITE_OTHER): Payer: Medicare HMO

## 2020-03-08 ENCOUNTER — Ambulatory Visit (INDEPENDENT_AMBULATORY_CARE_PROVIDER_SITE_OTHER): Payer: Medicare HMO | Admitting: Vascular Surgery

## 2020-03-08 ENCOUNTER — Other Ambulatory Visit: Payer: Self-pay

## 2020-03-08 ENCOUNTER — Encounter (INDEPENDENT_AMBULATORY_CARE_PROVIDER_SITE_OTHER): Payer: Self-pay | Admitting: Vascular Surgery

## 2020-03-08 VITALS — BP 121/64 | HR 60 | Resp 16 | Ht 66.0 in | Wt 226.0 lb

## 2020-03-08 DIAGNOSIS — Z95820 Peripheral vascular angioplasty status with implants and grafts: Secondary | ICD-10-CM

## 2020-03-08 DIAGNOSIS — I739 Peripheral vascular disease, unspecified: Secondary | ICD-10-CM

## 2020-03-08 DIAGNOSIS — E785 Hyperlipidemia, unspecified: Secondary | ICD-10-CM

## 2020-03-08 DIAGNOSIS — I2583 Coronary atherosclerosis due to lipid rich plaque: Secondary | ICD-10-CM

## 2020-03-08 DIAGNOSIS — I10 Essential (primary) hypertension: Secondary | ICD-10-CM | POA: Diagnosis not present

## 2020-03-08 DIAGNOSIS — J449 Chronic obstructive pulmonary disease, unspecified: Secondary | ICD-10-CM | POA: Diagnosis not present

## 2020-03-08 DIAGNOSIS — I2510 Atherosclerotic heart disease of native coronary artery without angina pectoris: Secondary | ICD-10-CM

## 2020-03-08 NOTE — Progress Notes (Signed)
MRN : [id number]  Darin Waters is a 59 y.o. (1960/06/16) male who presents with chief complaint of  Chief Complaint  Patient presents with  . Follow-up    4 wk [facility] post LE angio. Aorta iliac. abi  .  History of Present Illness:   The patient returns to the office for followup and review status post angiogram with intervention on 02/07/2020.   Angiography:  Percutaneous transluminal angioplasty and stent placement left common iliac artery with a 12 x 40 life star stent  The patient notes improvement in the lower extremity symptoms. No interval shortening of the patient's claudication distance or rest pain symptoms. Previous wounds have now healed.  No new ulcers or wounds have occurred since the last visit.  There have been no significant changes to the patient's overall health care.  The patient denies amaurosis fugax or recent TIA symptoms. There are no recent neurological changes noted. The patient denies history of DVT, PE or superficial thrombophlebitis. The patient denies recent episodes of angina or shortness of breath.   ABI's Rt=1.16 and Lt=1.16  (previous ABI's Rt=1.15 and Lt=0.67) Duplex US of the aorto iliac arterial system shows widely patent iliac stent   Current Meds  Medication Sig  . amLODipine-benazepril (LOTREL) 10-40 MG capsule Take 1 capsule by mouth daily before breakfast.   . aspirin EC 81 MG tablet Take 1 tablet (81 mg total) by mouth daily. Swallow whole.  Marland Kitchen atorvastatin (LIPITOR) 40 MG tablet Take 40 mg by mouth daily.   . citalopram (CELEXA) 10 MG tablet Take 10 mg by mouth daily before breakfast.   . clopidogrel (PLAVIX) 75 MG tablet Take 1 tablet (75 mg total) by mouth daily.  Marland Kitchen esomeprazole (NEXIUM) 20 MG capsule Take 20 mg by mouth daily before breakfast.  . Fluticasone-Umeclidin-Vilant (TRELEGY ELLIPTA) 100-62.5-25 MCG/INH AEPB Inhale 1 puff into the lungs daily. (Patient taking differently: Inhale 1 puff into the lungs daily as needed (COPD).)   . furosemide (LASIX) 20 MG tablet Take 20 mg by mouth daily.  Marland Kitchen gabapentin (NEURONTIN) 300 MG capsule Take 300 mg by mouth 2 (two) times daily.   Marland Kitchen glimepiride (AMARYL) 2 MG tablet Take 2 mg by mouth in the morning and at bedtime.   Marland Kitchen HYDROcodone-acetaminophen (NORCO) 10-325 MG tablet Take 2 tablets by mouth in the morning and at bedtime.   . insulin glargine (LANTUS) 100 UNIT/ML Solostar Pen Inject 65 Units into the skin at bedtime.   . metFORMIN (GLUCOPHAGE) 500 MG tablet Take 1,000 mg by mouth 2 (two) times daily with a meal.   . Multiple Vitamins-Minerals (MULTIVITAMIN WITH MINERALS) tablet Take 1 tablet by mouth daily.  . nabumetone (RELAFEN) 750 MG tablet Take 750 mg by mouth 2 (two) times daily.  Marland Kitchen PROAIR HFA 108 (90 Base) MCG/ACT inhaler Take 2 puffs by mouth every 6 (six) hours as needed for wheezing or shortness of breath.   . quiNINE (QUALAQUIN) 324 MG capsule Take 1 capsule by mouth daily.   Marland Kitchen spironolactone (ALDACTONE) 50 MG tablet Take 50 mg by mouth daily.    Past Medical History:  Diagnosis Date  . Arthritis    DDD- lumbar , thumbs - both hands   . COPD (chronic obstructive pulmonary disease) (HCC)   . Depression   . Diabetes mellitus without complication (HCC)   . GERD (gastroesophageal reflux disease)   . History of kidney stones 1990's    lithotripsy done   . Hypertension   . Sleep apnea 01/30/2016  [facility] in [place], results not avail to pt. yet    Past Surgical History:  Procedure Laterality Date  . ANTERIOR CERVICAL DECOMP/DISCECTOMY FUSION N/A 07/18/2015   Procedure: CERVICALFIVE-SIX, CERVICAL SIX-SEVEN ANTERIOR CERVICAL DECOMPRESSION/DISCECTOMY FUSION;  Surgeon: Tressie Stalker, MD;  Location: [facility];  Service: Neurosurgery;  Laterality: N/A;  C56 C67 anterior cervical decompression with fusion interbody prosthesis plating and bonegraft  . COLONOSCOPY    . COLONOSCOPY WITH PROPOFOL N/A 08/03/2019   Procedure: COLONOSCOPY WITH PROPOFOL;   Surgeon: Toledo, Boykin Nearing, MD;  Location: [facility] ENDOSCOPY;  Service: Gastroenterology;  Laterality: N/A;  . ESOPHAGOGASTRODUODENOSCOPY (EGD) WITH PROPOFOL N/A 08/03/2019   Procedure: ESOPHAGOGASTRODUODENOSCOPY (EGD) WITH PROPOFOL;  Surgeon: Toledo, Boykin Nearing, MD;  Location: [facility] ENDOSCOPY;  Service: Gastroenterology;  Laterality: N/A;  . LOWER EXTREMITY ANGIOGRAPHY Left 02/07/2020   Procedure: LOWER EXTREMITY ANGIOGRAPHY;  Surgeon: Renford Dills, MD;  Location: [facility]IVE [facility] LAB;  Service: Cardiovascular;  Laterality: Left;  . RECTAL POLYPECTOMY  4-5 yrs ago    Social History Social History   Tobacco Use  . Smoking status: Current Every Day Smoker    Types: E-cigarettes    Last attempt to quit: 05/2016    Years since quitting: 3.8  . Smokeless tobacco: Never Used  . Tobacco comment: pt in the middle of wanting to stop  Vaping Use  . Vaping Use: Every day  . Substances: Nicotine  Substance Use Topics  . Alcohol use: Yes    Alcohol/week: 3.0 standard drinks    Types: 3 Cans of beer per week    Comment: daily   . Drug use: No    Family History Family History  Problem Relation Age of Onset  . Diabetes Mother   . Asthma Mother   . Hypertension Mother   . Hyperlipidemia Mother   . Hyperlipidemia Father   . Dementia Father   . Hypertension Father   . Heart Problems Father        pace maker    No Known Allergies   REVIEW OF SYSTEMS (Negative unless checked)  Constitutional: [] Weight loss  [] Fever  [] Chills Cardiac: [] Chest pain   [] Chest pressure   [] Palpitations   [] Shortness of breath when laying flat   [] Shortness of breath with exertion. Vascular:  [] Pain in legs with walking   [] Pain in legs at rest  [] History of DVT   [] Phlebitis   [] Swelling in legs   [] Varicose veins   [] Non-healing ulcers Pulmonary:   [] Uses home oxygen   [] Productive cough   [] Hemoptysis   [] Wheeze  [] COPD   [] Asthma Neurologic:  [] Dizziness   [] Seizures   [] History of stroke   [] History of  TIA  [] Aphasia   [] Vissual changes   [] Weakness or numbness in arm   [] Weakness or numbness in leg Musculoskeletal:   [] Joint swelling   [x] Joint pain   [x] Low back pain Hematologic:  [] Easy bruising  [] Easy bleeding   [] Hypercoagulable state   [] Anemic Gastrointestinal:  [] Diarrhea   [] Vomiting  [] Gastroesophageal reflux/heartburn   [] Difficulty swallowing. Genitourinary:  [] Chronic kidney disease   [] Difficult urination  [] Frequent urination   [] Blood in urine Skin:  [] Rashes   [] Ulcers  Psychological:  [] History of anxiety   []  History of major depression.  Physical Examination  Vitals:   03/08/20 1042  BP: 121/64  Pulse: 60  Resp: 16  Weight: 226 lb (102.5 kg)  Height: 5\' 6"  (1.676 m)   Body mass index is 36.48 kg/m. Gen: WD/WN, NAD Head:  [place]/AT, No temporalis wasting.  Ear/Nose/Throat: Hearing grossly intact, nares w/o erythema or drainage Eyes: PER, EOMI, sclera nonicteric.  Neck: Supple, no large masses.   Pulmonary:  Good air movement, no audible wheezing bilaterally, no use of accessory muscles.  Cardiac: RRR, no JVD Vascular:  Vessel Right Left  Radial Palpable Palpable  PT Palpable Palpable  DP Palpable Palpable  Gastrointestinal: Non-distended. No guarding/no peritoneal signs.  Musculoskeletal: M/S 5/5 throughout.  No deformity or atrophy.  Neurologic: CN 2-12 intact. Symmetrical.  Speech is fluent. Motor exam as listed above. Psychiatric: Judgment intact, Mood & affect appropriate for pt's clinical situation. Dermatologic: No rashes or ulcers noted.  No changes consistent with cellulitis.   CBC Lab Results  Component Value Date   WBC 20.2 (H) 02/14/2016   HGB 14.0 02/14/2016   HCT 40.8 02/14/2016   MCV 96.7 02/14/2016   PLT 219 02/14/2016    BMET    Component Value Date/Time   NA 136 10/10/2019 1642   NA 140 04/11/2011 0738   K 5.7 (H) 10/10/2019 1642   K 3.7 04/11/2011 0738   CL 100 10/10/2019 1642   CL 105 04/11/2011 0738   CO2 26 02/14/2016  0220   CO2 28 04/11/2011 0738   GLUCOSE 165 (H) 10/10/2019 1642   GLUCOSE 212 (H) 02/14/2016 0220   GLUCOSE 155 (H) 04/11/2011 0738   BUN 21 02/07/2020 0730   BUN 20 10/10/2019 1642   BUN 14 04/11/2011 0738   CREATININE 1.23 02/07/2020 0730   CREATININE 0.85 04/11/2011 0738   CALCIUM 9.8 10/10/2019 1642   CALCIUM 9.3 04/11/2011 0738   GFRNONAA >60 02/07/2020 0730   GFRNONAA >60 04/11/2011 0738   GFRAA 61 10/10/2019 1642   GFRAA >60 04/11/2011 0738   CrCl cannot be calculated (Patient's most recent lab result is older than the maximum 21 days allowed.).  COAG No results found for: INR, PROTIME  Radiology No results found.   Assessment/Plan 1. PAD (peripheral artery disease) (HCC) Recommend:  The patient is status post successful angiogram with intervention.  The patient reports that the claudication symptoms and leg pain is essentially gone.   The patient denies lifestyle limiting changes at this point in time.  No further invasive studies, angiography or surgery at this time The patient should continue walking and begin a more formal exercise program.  The patient should continue antiplatelet therapy and aggressive treatment of the lipid abnormalities  Smoking cessation was again discussed  The patient should continue wearing graduated compression socks 10-15 mmHg strength to control the mild edema.  Patient should undergo noninvasive studies as ordered. The patient will follow up with me after the studies.   - VAS US AORTA/IVC/ILIACS; Future - VAS [place] ABI WITH/WO TBI; Future  2. Hypertension, unspecified type Continue antihypertensive medications as already ordered, these medications have been reviewed and there are no changes at this time.   3. Coronary artery disease due to lipid rich plaque Continue cardiac and antihypertensive medications as already ordered and reviewed, no changes at this time.  Continue statin as ordered and reviewed, no changes at this  time  Nitrates PRN for chest pain   4. Chronic obstructive pulmonary disease, unspecified COPD type (HCC) Continue pulmonary medications and aerosols as already ordered, these medications have been reviewed and there are no changes at this time.    5. Hyperlipidemia, unspecified hyperlipidemia type Continue statin as ordered and reviewed, no changes at this time     Levora Dredge, MD  03/08/2020  11:12 AM

## 2020-03-15 ENCOUNTER — Encounter: Payer: Self-pay | Admitting: Internal Medicine

## 2020-03-15 ENCOUNTER — Other Ambulatory Visit: Payer: Self-pay

## 2020-03-15 ENCOUNTER — Ambulatory Visit (INDEPENDENT_AMBULATORY_CARE_PROVIDER_SITE_OTHER): Payer: Medicare HMO | Admitting: Internal Medicine

## 2020-03-15 VITALS — BP 130/64 | HR 69 | Temp 98.3°F | Resp 16 | Ht 66.0 in | Wt 228.0 lb

## 2020-03-15 DIAGNOSIS — G894 Chronic pain syndrome: Secondary | ICD-10-CM | POA: Diagnosis not present

## 2020-03-15 DIAGNOSIS — F17201 Nicotine dependence, unspecified, in remission: Secondary | ICD-10-CM

## 2020-03-15 DIAGNOSIS — R0602 Shortness of breath: Secondary | ICD-10-CM

## 2020-03-15 NOTE — Patient Instructions (Signed)
Coping with Quitting Smoking  Quitting smoking is a physical and mental challenge. You will face cravings, withdrawal symptoms, and temptation. Before quitting, work with your health care provider to make a plan that can help you cope. Preparation can help you quit and keep you from giving in. How can I cope with cravings? Cravings usually last for 5-10 minutes. If you get through it, the craving will pass. Consider taking the following actions to help you cope with cravings:  Keep your mouth busy: ? Chew sugar-free gum. ? Suck on hard candies or a straw. ? Brush your teeth.  Keep your hands and body busy: ? Immediately change to a different activity when you feel a craving. ? Squeeze or play with a ball. ? Do an activity or a hobby, like making bead jewelry, practicing needlepoint, or working with wood. ? Mix up your normal routine. ? Take a short exercise break. Go for a quick walk or run up and down stairs. ? Spend time in public places where smoking is not allowed.  Focus on doing something kind or helpful for someone else.  Call a friend or family member to talk during a craving.  Join a support group.  Call a quit line, such as 1-800-QUIT-NOW.  Talk with your health care provider about medicines that might help you cope with cravings and make quitting easier for you. How can I deal with withdrawal symptoms? Your body may experience negative effects as it tries to get used to not having nicotine in the system. These effects are called withdrawal symptoms. They may include:  Feeling hungrier than normal.  Trouble concentrating.  Irritability.  Trouble sleeping.  Feeling depressed.  Restlessness and agitation.  Craving a cigarette. To manage withdrawal symptoms:  Avoid places, people, and activities that trigger your cravings.  Remember why you want to quit.  Get plenty of sleep.  Avoid coffee and other caffeinated drinks. These may worsen some of your  symptoms. How can I handle social situations? Social situations can be difficult when you are quitting smoking, especially in the first few weeks. To manage this, you can:  Avoid parties, bars, and other social situations where people might be smoking.  Avoid alcohol.  Leave right away if you have the urge to smoke.  Explain to your family and friends that you are quitting smoking. Ask for understanding and support.  Plan activities with friends or family where smoking is not an option. What are some ways I can cope with stress? Wanting to smoke may cause stress, and stress can make you want to smoke. Find ways to manage your stress. Relaxation techniques can help. For example:  Breathe slowly and deeply, in through your nose and out through your mouth.  Listen to soothing, relaxing music.  Talk with a family member or friend about your stress.  Light a candle.  Soak in a bath or take a shower.  Think about a peaceful place. What are some ways I can prevent weight gain? Be aware that many people gain weight after they quit smoking. However, not everyone does. To keep from gaining weight, have a plan in place before you quit and stick to the plan after you quit. Your plan should include:  Having healthy snacks. When you have a craving, it may help to: ? Eat plain popcorn, crunchy carrots, celery, or other cut vegetables. ? Chew sugar-free gum.  Changing how you eat: ? Eat small portion sizes at meals. ? Eat 4-6 small meals   throughout the day instead of 1-2 large meals a day. ? Be mindful when you eat. Do not watch television or do other things that might distract you as you eat.  Exercising regularly: ? Make time to exercise each day. If you do not have time for a long workout, do short bouts of exercise for 5-10 minutes several times a day. ? Do some form of strengthening exercise, like weight lifting, and some form of aerobic exercise, like running or swimming.  Drinking  plenty of water or other low-calorie or no-calorie drinks. Drink 6-8 glasses of water daily, or as much as instructed by your health care provider. Summary  Quitting smoking is a physical and mental challenge. You will face cravings, withdrawal symptoms, and temptation to smoke again. Preparation can help you as you go through these challenges.  You can cope with cravings by keeping your mouth busy (such as by chewing gum), keeping your body and hands busy, and making calls to family, friends, or a helpline for people who want to quit smoking.  You can cope with withdrawal symptoms by avoiding places where people smoke, avoiding drinks with caffeine, and getting plenty of rest.  Ask your health care provider about the different ways to prevent weight gain, avoid stress, and handle social situations. This information is not intended to replace advice given to you by your health care provider. Make sure you discuss any questions you have with your health care provider. Document Revised: 02/27/2017 Document Reviewed: 03/14/2016 Elsevier Patient Education  2020 Elsevier Inc.  

## 2020-03-15 NOTE — Progress Notes (Signed)
[facility] [address] [place], [place] [address]  Pulmonary Sleep Medicine   Office Visit Note  Patient Name: Darin Waters DOB: Mar 16, 1961 MRN [id number]  Date of Service: 03/15/2020  Complaints/HPI: CT scan followup today. He did not have any nodules noted on the CT scan. Patient states that he has some SOB with exertion. He quit smoking but is vaping. States he vapes daily. He has had a spirometry but nor recent PFT has been done  ROS  General: (-) fever, (-) chills, (-) night sweats, (-) weakness Skin: (-) rashes, (-) itching,. Eyes: (-) visual changes, (-) redness, (-) itching. Nose and Sinuses: (-) nasal stuffiness or itchiness, (-) postnasal drip, (-) nosebleeds, (-) sinus trouble. Mouth and Throat: (-) sore throat, (-) hoarseness. Neck: (-) swollen glands, (-) enlarged thyroid, (-) neck pain. Respiratory: - cough, (-) bloody sputum, + shortness of breath, - wheezing. Cardiovascular: - ankle swelling, (-) chest pain. Lymphatic: (-) lymph node enlargement. Neurologic: (-) numbness, (-) tingling. Psychiatric: (-) anxiety, (-) depression   Current Medication: Outpatient Encounter Medications as of 03/15/2020  Medication Sig   amLODipine-benazepril (LOTREL) 10-40 MG capsule Take 1 capsule by mouth daily before breakfast.    aspirin EC 81 MG tablet Take 1 tablet (81 mg total) by mouth daily. Swallow whole.   atorvastatin (LIPITOR) 40 MG tablet Take 40 mg by mouth daily.    citalopram (CELEXA) 10 MG tablet Take 10 mg by mouth daily before breakfast.    clopidogrel (PLAVIX) 75 MG tablet Take 1 tablet (75 mg total) by mouth daily.   esomeprazole (NEXIUM) 20 MG capsule Take 20 mg by mouth daily before breakfast.   Fluticasone-Umeclidin-Vilant (TRELEGY ELLIPTA) 100-62.5-25 MCG/INH AEPB Inhale 1 puff into the lungs daily. (Patient taking differently: Inhale 1 puff into the lungs daily as needed (COPD).)   furosemide (LASIX) 20 MG tablet Take 20 mg by  mouth daily.   gabapentin (NEURONTIN) 300 MG capsule Take 300 mg by mouth 2 (two) times daily.    glimepiride (AMARYL) 2 MG tablet Take 2 mg by mouth in the morning and at bedtime.    HYDROcodone-acetaminophen (NORCO) 10-325 MG tablet Take 2 tablets by mouth in the morning and at bedtime.    insulin glargine (LANTUS) 100 UNIT/ML Solostar Pen Inject 65 Units into the skin at bedtime.    metFORMIN (GLUCOPHAGE) 500 MG tablet Take 1,000 mg by mouth 2 (two) times daily with a meal.    Multiple Vitamins-Minerals (MULTIVITAMIN WITH MINERALS) tablet Take 1 tablet by mouth daily.   nabumetone (RELAFEN) 750 MG tablet Take 750 mg by mouth 2 (two) times daily.   PROAIR HFA 108 (90 Base) MCG/ACT inhaler Take 2 puffs by mouth every 6 (six) hours as needed for wheezing or shortness of breath.    quiNINE (QUALAQUIN) 324 MG capsule Take 1 capsule by mouth daily.    spironolactone (ALDACTONE) 50 MG tablet Take 50 mg by mouth daily.   ONETOUCH VERIO test strip SMARTSIG:Via Meter   No facility-administered encounter medications on file as of 03/15/2020.    Surgical History: Past Surgical History:  Procedure Laterality Date   ANTERIOR CERVICAL DECOMP/DISCECTOMY FUSION N/A 07/18/2015   Procedure: CERVICALFIVE-SIX, CERVICAL SIX-SEVEN ANTERIOR CERVICAL DECOMPRESSION/DISCECTOMY FUSION;  Surgeon: Tressie Stalker, MD;  Location: [facility];  Service: Neurosurgery;  Laterality: N/A;  C56 C67 anterior cervical decompression with fusion interbody prosthesis plating and bonegraft   COLONOSCOPY     COLONOSCOPY WITH PROPOFOL N/A 08/03/2019   Procedure: COLONOSCOPY WITH PROPOFOL;  Surgeon: Toledo, Boykin Nearing, MD;  Location: [facility];  Service: Gastroenterology;  Laterality: N/A;   ESOPHAGOGASTRODUODENOSCOPY (EGD) WITH PROPOFOL N/A 08/03/2019   Procedure: ESOPHAGOGASTRODUODENOSCOPY (EGD) WITH PROPOFOL;  Surgeon: Toledo, Boykin Nearing, MD;  Location: [facility] ENDOSCOPY;  Service: Gastroenterology;  Laterality: N/A;    leg stint     nov 2021   LOWER EXTREMITY ANGIOGRAPHY Left 02/07/2020   Procedure: LOWER EXTREMITY ANGIOGRAPHY;  Surgeon: Renford Dills, MD;  Location: [facility];  Service: Cardiovascular;  Laterality: Left;   RECTAL POLYPECTOMY  4-5 yrs ago    Medical History: Past Medical History:  Diagnosis Date   Arthritis    DDD- lumbar , thumbs - both hands    COPD (chronic obstructive pulmonary disease) (HCC)    Depression    Diabetes mellitus without complication (HCC)    GERD (gastroesophageal reflux disease)    History of kidney stones 1990's    lithotripsy done    Hypertension    Sleep apnea 01/30/2016   [facility] in [place], results not avail to pt. yet    Family History: Family History  Problem Relation Age of Onset   Diabetes Mother    Asthma Mother    Hypertension Mother    Hyperlipidemia Mother    Hyperlipidemia Father    Dementia Father    Hypertension Father    Heart Problems Father        Visual merchandiser    Social History: Social History   Socioeconomic History   Marital status: Married    Spouse name: Not on file   Number of children: Not on file   Years of education: Not on file   Highest education level: Not on file  Occupational History   Not on file  Tobacco Use   Smoking status: Current Every Day Smoker    Types: E-cigarettes    Last attempt to quit: 05/2016    Years since quitting: 3.8   Smokeless tobacco: Never Used   Tobacco comment: pt in the middle of wanting to stop  Vaping Use   Vaping Use: Every day   Substances: Nicotine  Substance and Sexual Activity   Alcohol use: Yes    Alcohol/week: 3.0 standard drinks    Types: 3 Cans of beer per week    Comment: daily    Drug use: No   Sexual activity: Not on file  Other Topics Concern   Not on file  Social History Narrative   Not on file   Social Determinants of Health   Financial Resource Strain: Not on file  Food Insecurity: Not on  file  Transportation Needs: Not on file  Physical Activity: Not on file  Stress: Not on file  Social Connections: Not on file  Intimate Partner Violence: Not on file    Vital Signs: Blood pressure 130/64, pulse 69, temperature 98.3 F (36.8 C), resp. rate 16, height 5\' 6"  (1.676 m), weight 228 lb (103.4 kg), SpO2 95 %.  Examination: General Appearance: The patient is well-developed, well-nourished, and in no distress. Skin: Gross inspection of skin unremarkable. Head: normocephalic, no gross deformities. Eyes: no gross deformities noted. ENT: ears appear grossly normal no exudates. Neck: Supple. No thyromegaly. No LAD. Respiratory: no rhonchi noted. Cardiovascular: Normal S1 and S2 without murmur or rub. Extremities: No cyanosis. pulses are equal. Neurologic: Alert and oriented. No involuntary movements.  LABS: Recent Results (from the past 2160 hour(s))  SARS CORONAVIRUS 2 (TAT 6-24 HRS) Nasopharyngeal Nasopharyngeal Swab  Status: None   Collection Time: 02/03/20  1:22 PM   Specimen: Nasopharyngeal Swab  Result Value Ref Range   SARS Coronavirus 2 NEGATIVE NEGATIVE    Comment: (NOTE) SARS-CoV-2 target nucleic acids are NOT DETECTED.  The SARS-CoV-2 RNA is generally detectable in upper and lower respiratory specimens during the acute phase of infection. Negative results do not preclude SARS-CoV-2 infection, do not rule out co-infections with other pathogens, and should not be used as the sole basis for treatment or other patient management decisions. Negative results must be combined with clinical observations, patient history, and epidemiological information. The expected result is Negative.  Fact Sheet for Patients: HairSlick.no  Fact Sheet for Healthcare Providers: quierodirigir.com  This test is not yet approved or cleared by the [place] FDA and  has been authorized for detection and/or diagnosis of  SARS-CoV-2 by FDA under an Emergency Use Authorization (EUA). This EUA will remain  in effect (meaning this test can be used) for the duration of the COVID-19 declaration under Se ction 564(b)(1) of the Act, 21 U.S.C. section 360bbb-3(b)(1), unless the authorization is terminated or revoked sooner.  Performed at [facility], [address], [place], [place] [address]   BUN     Status: None   Collection Time: 02/07/20  7:30 AM  Result Value Ref Range   BUN 21 8 - 23 mg/dL    Comment: Performed at [facility], [address] [place], [place] [address]  Creatinine, serum     Status: None   Collection Time: 02/07/20  7:30 AM  Result Value Ref Range   Creatinine, Ser 1.23 0.61 - 1.24 mg/dL   GFR, Estimated >62 >69 mL/min    Comment: (NOTE) Calculated using the CKD-EPI Creatinine Equation (2021) Performed at [facility], [address] [place], [place] [address]   Glucose, capillary     Status: None   Collection Time: 02/07/20  7:35 AM  Result Value Ref Range   Glucose-Capillary 86 70 - 99 mg/dL    Comment: Glucose reference range applies only to samples taken after fasting for at least 8 hours.  Glucose, capillary     Status: Abnormal   Collection Time: 02/07/20  9:39 AM  Result Value Ref Range   Glucose-Capillary 52 (L) 70 - 99 mg/dL    Comment: Glucose reference range applies only to samples taken after fasting for at least 8 hours.  Glucose, capillary     Status: Abnormal   Collection Time: 02/07/20 10:16 AM  Result Value Ref Range   Glucose-Capillary 135 (H) 70 - 99 mg/dL    Comment: Glucose reference range applies only to samples taken after fasting for at least 8 hours.  Glucose, capillary     Status: Abnormal   Collection Time: 02/07/20 11:35 AM  Result Value Ref Range   Glucose-Capillary 139 (H) 70 - 99 mg/dL    Comment: Glucose reference range applies only to samples taken after fasting for at least 8 hours.    Radiology: No results  found.  No results found.  VAS [place] ABI WITH/WO TBI  Result Date: 03/08/2020 LOWER EXTREMITY DOPPLER STUDY Indications: Claudication, and left.  Vascular Interventions: 11/9/21Left PTA CIA stent. Comparison Study: 01/12/20 Performing Technologist: Salvadore Farber RVT  Examination Guidelines: A complete evaluation includes at minimum, Doppler waveform signals and systolic blood pressure reading at the level of bilateral brachial, anterior tibial, and posterior tibial arteries, when vessel segments are accessible. Bilateral testing is considered an integral part of  a complete examination. Photoelectric Plethysmograph (PPG) waveforms and toe systolic pressure readings are included as required and additional duplex testing as needed. Limited examinations for reoccurring indications may be performed as noted.  ABI Findings: +---------+------------------+-----+---------+--------+  Right     Rt Pressure (mmHg) Index Waveform  Comment   +---------+------------------+-----+---------+--------+  Brachial  134                                          +---------+------------------+-----+---------+--------+  ATA       156                1.16  triphasic           +---------+------------------+-----+---------+--------+  PTA       151                1.13  triphasic           +---------+------------------+-----+---------+--------+  Great Toe 136                1.01  Normal              +---------+------------------+-----+---------+--------+ +---------+------------------+-----+---------+-------+  Left      Lt Pressure (mmHg) Index Waveform  Comment  +---------+------------------+-----+---------+-------+  ATA       156                1.16  biphasic           +---------+------------------+-----+---------+-------+  PTA       153                1.14  triphasic          +---------+------------------+-----+---------+-------+  Great Toe 136                1.01  Normal             +---------+------------------+-----+---------+-------+  +-------+-----------+-----------+------------+------------+  ABI/TBI Today's ABI Today's TBI Previous ABI Previous TBI  +-------+-----------+-----------+------------+------------+  Right   1.16        1.01        1.15         .97           +-------+-----------+-----------+------------+------------+  Left    1.16        1.01        .67          .56           +-------+-----------+-----------+------------+------------+ Left ABIs and TBIs appear increased compared to prior study on 01/12/20.  Summary: Right: Resting right ankle-brachial index is within normal range. No evidence of significant right lower extremity arterial disease. The right toe-brachial index is normal. Left: Resting left ankle-brachial index is within normal range. No evidence of significant left lower extremity arterial disease. The left toe-brachial index is normal.  *See table(s) above for measurements and observations.  Electronically signed by Levora Dredge MD on 03/08/2020 at 5:47:32 PM.   Final    VAS US AORTA/IVC/ILIACS  Result Date: 03/08/2020 ABDOMINAL AORTA STUDY Vascular Interventions: 02/07/20 left pta stent in CIA. Limitations: Air/bowel gas and obesity.  Performing Technologist: Salvadore Farber RVT  Examination Guidelines: A complete evaluation includes B-mode imaging, spectral Doppler, color Doppler, and power Doppler as needed of all accessible portions of each vessel. Bilateral testing is considered an integral part of a complete examination. Limited examinations for reoccurring indications may be performed as noted.  Abdominal  Aorta Findings: +-------------+-------+----------+----------+---------+--------+--------+  Location      AP (cm) Trans (cm) PSV (cm/s) Waveform  Thrombus Comments  +-------------+-------+----------+----------+---------+--------+--------+  Proximal                         63                                      +-------------+-------+----------+----------+---------+--------+--------+  Mid                               67                                      +-------------+-------+----------+----------+---------+--------+--------+  Distal                           62                                      +-------------+-------+----------+----------+---------+--------+--------+  RT CIA Prox                      222        triphasic                    +-------------+-------+----------+----------+---------+--------+--------+  RT CIA Mid                       189        triphasic                    +-------------+-------+----------+----------+---------+--------+--------+  RT CIA Distal                    186        triphasic                    +-------------+-------+----------+----------+---------+--------+--------+  RT EIA Prox                      176        triphasic                    +-------------+-------+----------+----------+---------+--------+--------+  RT EIA Mid                       170        triphasic                    +-------------+-------+----------+----------+---------+--------+--------+  RT EIA Distal                    198        triphasic                    +-------------+-------+----------+----------+---------+--------+--------+  LT CIA Prox                      193        triphasic                    +-------------+-------+----------+----------+---------+--------+--------+  LT CIA Mid                       201        triphasic                    +-------------+-------+----------+----------+---------+--------+--------+  LT CIA Distal                    156        triphasic                    +-------------+-------+----------+----------+---------+--------+--------+  LT EIA Prox                      189        triphasic                    +-------------+-------+----------+----------+---------+--------+--------+  LT EIA Mid                       227        triphasic                    +-------------+-------+----------+----------+---------+--------+--------+  LT EIA Distal                    201         biphasic                     +-------------+-------+----------+----------+---------+--------+--------+  Summary: Abdominal Aorta: Limited views of abdominal aorta due to body habitus. No aneurysm identified. Mild atherosclerosis throughout the [place], CIA's, and EIA"s. Evidence of patent left CIA stent. Velocities suggest moderate stenosis throughout the CIA's and EIA's.  *See table(s) above for measurements and observations.  Electronically signed by Levora Dredge MD on 03/08/2020 at 5:47:34 PM.    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   Chronic lower extremity pain (1ry area of Pain) (Bilateral) (L>R) 10/10/2019   COPD (chronic obstructive pulmonary disease) (HCC) 10/09/2019   CAD (coronary artery disease) 10/09/2019   Disorder of skeletal system 10/09/2019   Pharmacologic therapy 10/09/2019   Problems influencing health status 10/09/2019   Nicotine dependence with current use 06/23/2017   Morbid obesity (HCC) 06/23/2017   Coronary artery disease due to lipid rich plaque 05/14/2017   Chronic, continuous use of opioids 05/14/2017   Fatty liver disease, nonalcoholic 05/14/2017   Obesity (BMI 30-39.9) 05/14/2017   Spondylolisthesis of lumbar region 02/13/2016   Cervical herniated disc 07/18/2015   SOB (shortness of breath) on exertion 07/13/2014   DDD (degenerative disc disease), lumbar 02/28/2014   Lumbar radiculitis (S1 dermatome) (Bilateral) (L>R) 01/25/2014   Obstructive chronic bronchitis without exacerbation (HCC) 06/14/2013   Chronic pain  syndrome 06/14/2013   Depression (emotion) 06/14/2013   Hyperlipidemia, unspecified 06/14/2013   Hypertension 06/14/2013   Microalbuminuric diabetic nephropathy (HCC) 06/14/2013   Tobacco abuse 06/14/2013   Diabetes type 2, uncontrolled (HCC) 03/31/2006    1. SOB he has a smoking  history and now states that he does vape had a lengthy discussion with him about the dangers of vaping and suggest that he strongly work on discontinuing all nicotine dependence.  In addition I have ordered pulmonary function to be studied 2. Chronic pain syndrome he is on narcotic medication fo rhis back he had a sleep study done back in 2017 which showed an AHI of 4.0 3. Nicotine dependence he needs to stop vaping counseling has been provided on nicotine withdrawal nicotine cessation  General Counseling: I have discussed the findings of the evaluation and examination with Aidan.  I have also discussed any further diagnostic evaluation thatmay be needed or ordered today. Datron verbalizes understanding of the findings of todays visit. We also reviewed his medications today and discussed drug interactions and side effects including but not limited excessive drowsiness and altered mental states. We also discussed that there is always a risk not just to him but also people around him. he has been encouraged to call the office with any questions or concerns that should arise related to todays visit.  No orders of the defined types were placed in this encounter.    Time spent: 50  I have personally obtained a history, examined the patient, evaluated laboratory and imaging results, formulated the assessment and plan and placed orders.    Yevonne Pax, MD [facility] Pulmonary and Critical Care Sleep medicine

## 2020-03-22 ENCOUNTER — Ambulatory Visit
Admission: EM | Admit: 2020-03-22 | Discharge: 2020-03-22 | Disposition: A | Discharge status: Home / Self Care | Payer: Medicare HMO | Attending: Emergency Medicine | Admitting: Emergency Medicine

## 2020-03-22 ENCOUNTER — Other Ambulatory Visit: Payer: Self-pay

## 2020-03-22 DIAGNOSIS — Z20822 Contact with and (suspected) exposure to covid-19: Secondary | ICD-10-CM | POA: Diagnosis not present

## 2020-03-22 DIAGNOSIS — J029 Acute pharyngitis, unspecified: Secondary | ICD-10-CM | POA: Diagnosis present

## 2020-03-22 LAB — GROUP A STREP BY PCR: Group A Strep by PCR: NOT DETECTED

## 2020-03-22 LAB — RESP PANEL BY RT-PCR (FLU A&B, COVID) ARPGX2
Influenza A by PCR: NEGATIVE
Influenza B by PCR: NEGATIVE
SARS Coronavirus 2 by RT PCR: NEGATIVE

## 2020-03-22 MED ORDER — FLUTICASONE PROPIONATE 50 MCG/ACT NA SUSP: 2.0000 | Freq: Every day | NASAL | 0 refills | Status: DC

## 2020-03-22 NOTE — ED Triage Notes (Signed)
Patient states that he has been having a sore throat for 2-3 days. Reports that he is concerned that he has strep throat.

## 2020-03-22 NOTE — Discharge Instructions (Signed)
I will contact you only if your strep or Covid test come back positive.  If your strep is positive I will call in antibiotics, if your Covid is positive I will arrange for a monoclonal antibody infusion.  Continue Chloraseptic, Benadryl/Maalox, start Flonase.  1 gram of Tylenol  3-4 times a day as needed for pain.  Make sure you drink plenty of extra fluids.  Some people find salt water gargles and  Traditional Medic[employer]'s "Throat Coat" tea helpful. Take 5 mL of liquid Benadryl and 5 mL of Maalox. Mix it together, and then hold it in your mouth for as long as you can and then swallow. You may do this 4 times a day.    Go to www.goodrx.com to look up your medications. This will give you a list of where you can find your prescriptions at the most affordable prices. Or ask the pharmacist what the cash price is, or if they have any other discount programs available to help make your medication more affordable. This can be less expensive than what you would pay with insurance.

## 2020-03-22 NOTE — ED Provider Notes (Signed)
HPI  SUBJECTIVE:  Patient reports sore throat starting 2 days ago. Sx worse with swallowing and at night.  Sx better with Chloraseptic. Has been taking NyQuil w/ o relief.  No fever   No neck stiffness  No Cough No nasal congestion, rhinorrhea No Myalgias No Headache No Rash  No loss of taste or smell No shortness of breath or difficulty breathing No nausea, vomiting No diarrhea No abdominal pain     No Recent Strep, mono, COVID exposure Got both the flu and Covid vaccine.  Moderna booster in November 21. No reflux sxs No Allergy sxs  No Breathing difficulty, sensation of throat swelling shut + Raspy but not muffled voice No Drooling No Trismus No abx in past month.  No antipyretic in past 4-6 hrs  Past medical history of diabetes, hypertension, COPD, peripheral arterial disease on Plavix, no history of chronic kidney disease, frequent strep.   ZYS:[email or web address], Hermenia Fiscal, NP    Past Medical History:  Diagnosis Date  . Arthritis    DDD- lumbar , thumbs - both hands   . COPD (chronic obstructive pulmonary disease) (HCC)   . Depression   . Diabetes mellitus without complication (HCC)   . GERD (gastroesophageal reflux disease)   . History of kidney stones 1990's    lithotripsy done   . Hypertension   . Sleep apnea 01/30/2016   [facility] in [place], results not avail to pt. yet    Past Surgical History:  Procedure Laterality Date  . ANTERIOR CERVICAL DECOMP/DISCECTOMY FUSION N/A 07/18/2015   Procedure: CERVICALFIVE-SIX, CERVICAL SIX-SEVEN ANTERIOR CERVICAL DECOMPRESSION/DISCECTOMY FUSION;  Surgeon: Tressie Stalker, MD;  Location: [facility];  Service: Neurosurgery;  Laterality: N/A;  C56 C67 anterior cervical decompression with fusion interbody prosthesis plating and bonegraft  . COLONOSCOPY    . COLONOSCOPY WITH PROPOFOL N/A 08/03/2019   Procedure: COLONOSCOPY WITH PROPOFOL;  Surgeon: Toledo, Boykin Nearing, MD;  Location: [facility];  Service:  Gastroenterology;  Laterality: N/A;  . ESOPHAGOGASTRODUODENOSCOPY (EGD) WITH PROPOFOL N/A 08/03/2019   Procedure: ESOPHAGOGASTRODUODENOSCOPY (EGD) WITH PROPOFOL;  Surgeon: Toledo, Boykin Nearing, MD;  Location: [facility];  Service: Gastroenterology;  Laterality: N/A;  . leg stint     nov 2021  . LOWER EXTREMITY ANGIOGRAPHY Left 02/07/2020   Procedure: LOWER EXTREMITY ANGIOGRAPHY;  Surgeon: Renford Dills, MD;  Location: [facility];  Service: Cardiovascular;  Laterality: Left;  . RECTAL POLYPECTOMY  4-5 yrs ago    Family History  Problem Relation Age of Onset  . Diabetes Mother   . Asthma Mother   . Hypertension Mother   . Hyperlipidemia Mother   . Hyperlipidemia Father   . Dementia Father   . Hypertension Father   . Heart Problems Father        pace maker    Social History   Tobacco Use  . Smoking status: Current Every Day Smoker    Types: E-cigarettes    Last attempt to quit: 05/2016    Years since quitting: 3.8  . Smokeless tobacco: Never Used  . Tobacco comment: pt in the middle of wanting to stop  Vaping Use  . Vaping Use: Every day  . Substances: Nicotine  Substance Use Topics  . Alcohol use: Yes    Alcohol/week: 3.0 standard drinks    Types: 3 Cans of beer per week    Comment: daily   . Drug use: No    No current facility-administered medications for this encounter.  Current Outpatient Medications:  .  amLODipine-benazepril (LOTREL) 10-40 MG capsule, Take 1 capsule by mouth daily before breakfast. , Disp: , Rfl:  .  aspirin EC 81 MG tablet, Take 1 tablet (81 mg total) by mouth daily. Swallow whole., Disp: 150 tablet, Rfl: 2 .  atorvastatin (LIPITOR) 40 MG tablet, Take 40 mg by mouth daily. , Disp: , Rfl:  .  citalopram (CELEXA) 10 MG tablet, Take 10 mg by mouth daily before breakfast. , Disp: , Rfl:  .  clopidogrel (PLAVIX) 75 MG tablet, Take 1 tablet (75 mg total) by mouth daily., Disp: 30 tablet, Rfl: 5 .  esomeprazole (NEXIUM) 20 MG capsule, Take  20 mg by mouth daily before breakfast., Disp: , Rfl:  .  Fluticasone-Umeclidin-Vilant (TRELEGY ELLIPTA) 100-62.5-25 MCG/INH AEPB, Inhale 1 puff into the lungs daily. (Patient taking differently: Inhale 1 puff into the lungs daily as needed (COPD).), Disp: 60 each, Rfl: 2 .  furosemide (LASIX) 20 MG tablet, Take 20 mg by mouth daily., Disp: , Rfl: 1 .  gabapentin (NEURONTIN) 300 MG capsule, Take 300 mg by mouth 2 (two) times daily. , Disp: , Rfl:  .  glimepiride (AMARYL) 2 MG tablet, Take 2 mg by mouth in the morning and at bedtime. , Disp: , Rfl:  .  HYDROcodone-acetaminophen (NORCO) 10-325 MG tablet, Take 2 tablets by mouth in the morning and at bedtime. , Disp: , Rfl:  .  insulin glargine (LANTUS) 100 UNIT/ML Solostar Pen, Inject 65 Units into the skin at bedtime. , Disp: , Rfl:  .  metFORMIN (GLUCOPHAGE) 500 MG tablet, Take 1,000 mg by mouth 2 (two) times daily with a meal. , Disp: , Rfl:  .  Multiple Vitamins-Minerals (MULTIVITAMIN WITH MINERALS) tablet, Take 1 tablet by mouth daily., Disp: , Rfl:  .  nabumetone (RELAFEN) 750 MG tablet, Take 750 mg by mouth 2 (two) times daily., Disp: , Rfl:  .  ONETOUCH VERIO test strip, SMARTSIG:Via Meter, Disp: , Rfl:  .  PROAIR HFA 108 (90 Base) MCG/ACT inhaler, Take 2 puffs by mouth every 6 (six) hours as needed for wheezing or shortness of breath. , Disp: , Rfl: 2 .  quiNINE (QUALAQUIN) 324 MG capsule, Take 1 capsule by mouth daily. , Disp: , Rfl: 1 .  spironolactone (ALDACTONE) 50 MG tablet, Take 50 mg by mouth daily., Disp: , Rfl:  .  fluticasone (FLONASE) 50 MCG/ACT nasal spray, Place 2 sprays into both nostrils daily., Disp: 16 g, Rfl: 0  No Known Allergies   ROS  As noted in HPI.   Physical Exam  BP 135/64 (BP Location: Right Arm)   Pulse 61   Temp 98.4 F (36.9 C) (Oral)   Resp 14   Ht 5\' 6"  (1.676 m)   Wt 102.1 kg   SpO2 96%   BMI 36.32 kg/m   Constitutional: Well developed, well nourished, no acute distress Eyes:  EOMI,  conjunctiva normal bilaterally HENT: Normocephalic, atraumatic,mucus membranes moist. +nasal congestion +  erythematous oropharynx - enlarged tonsils  - exudates. Uvula midline.  Respiratory: Normal inspiratory effort Cardiovascular: Normal rate, no murmurs, rubs, gallops GI: nondistended, nontender. No appreciable splenomegaly skin: No rash, skin intact Lymph: - Anterior cervical LN.  No posterior cervical lymphadenopathy Musculoskeletal: no deformities Neurologic: Alert & oriented x 3, no focal neuro deficits Psychiatric: Speech and behavior appropriate.    Medications - No data to display  Orders Placed This Encounter  Procedures  . Group A Strep by PCR    Standing Status:   Standing  Number of Occurrences:   1    Order Specific Question:   Patient immune status    Answer:   Normal  . Resp Panel by RT-PCR (Flu A&B, Covid) Nasopharyngeal Swab    Standing Status:   Standing    Number of Occurrences:   1    Order Specific Question:   Is this test for diagnosis or screening    Answer:   Diagnosis of ill patient    Order Specific Question:   Symptomatic for COVID-19 as defined by CDC    Answer:   Yes    Order Specific Question:   Date of Symptom Onset    Answer:   03/20/2020    Order Specific Question:   Hospitalized for COVID-19    Answer:   No    Order Specific Question:   Admitted to ICU for COVID-19    Answer:   No    Order Specific Question:   Previously tested for COVID-19    Answer:   Yes    Order Specific Question:   Resident in a congregate (group) care setting    Answer:   No    Order Specific Question:   Employed in healthcare setting    Answer:   No    Order Specific Question:   Has patient completed COVID vaccination(s) (2 doses of Pfizer/Moderna 1 dose of [employer])    Answer:   No  . Airborne precautions    Standing Status:   Standing    Number of Occurrences:   1    Results for orders placed or performed during the hospital encounter of 03/22/20  (from the past 24 hour(s))  Group A Strep by PCR     Status: None   Collection Time: 03/22/20  8:23 AM   Specimen: Throat; Sterile Swab  Result Value Ref Range   Group A Strep by PCR NOT DETECTED NOT DETECTED  Resp Panel by RT-PCR (Flu A&B, Covid) Nasopharyngeal Swab     Status: None   Collection Time: 03/22/20  8:50 AM   Specimen: Nasopharyngeal Swab; Nasopharyngeal(NP) swabs in vial transport medium  Result Value Ref Range   SARS Coronavirus 2 by RT PCR NEGATIVE NEGATIVE   Influenza A by PCR NEGATIVE NEGATIVE   Influenza B by PCR NEGATIVE NEGATIVE   No results found.  ED Clinical Impression  1. Pharyngitis, unspecified etiology   2. Encounter for laboratory testing for COVID-19 virus      ED Assessment/Plan  Strep, Covid PCR negative.  Patient with a viral pharyngitis.  continue Chloraseptic, start Benadryl/Maalox mixture, Flonase, Tylenol.  Follow-up with PMD as needed.  To the ER if he gets worse  Discussed labs,  MDM, plan and followup with patient. Discussed sn/sx that should prompt return to the ED. patient agrees with plan.   Meds ordered this encounter  Medications  . fluticasone (FLONASE) 50 MCG/ACT nasal spray    Sig: Place 2 sprays into both nostrils daily.    Dispense:  16 g    Refill:  0     *This clinic note was created using Scientist, clinical (histocompatibility and immunogenetics). Therefore, there may be occasional mistakes despite careful proofreading.    Domenick Gong, MD 03/22/20 [phone]

## 2020-05-27 NOTE — Progress Notes (Signed)
Patient: Darin Waters  Service Category: E/M  Provider: Gaspar Cola, MD  DOB: 1960-06-23  DOS: 05/28/2020  Location: Office  MRN: [id number]  Setting: Ambulatory outpatient  Referring Provider: Sallee Lange, *  Type: Established Patient  Specialty: Interventional Pain Management  PCP: Sallee Lange, NP  Location: Remote location  Delivery: TeleHealth     Virtual Encounter - Pain Management PROVIDER NOTE: Information contained herein reflects review and annotations entered in association with encounter. Interpretation of such information and data should be left to medically-trained personnel. Information provided to patient can be located elsewhere in the medical record under "Patient Instructions". Document created using STT-dictation technology, any transcriptional errors that may result from process are unintentional.    Contact & Pharmacy Preferred: [phone] Home: [phone] (home) Mobile: There is no such number on file (mobile). E-mail: [email or web address]  [id number] [id number]: [phone]: [phone] [place] [facility]Phone: [phone]: [phone]  Pre-screening  Mr. BAnguianooffered "in-person" vs "virtual" encounter. He indicated preferring virtual for this encounter.   Reason COVID-19*  Social distancing based on CDC and AMA recommendations.   I contacted DRaykwon HobbsBarrett on 05/28/2020 via telephone.      I clearly identified myself as FGaspar Cola MD. I verified that I was speaking with the correct person using two identifiers (Name: DHEINZ ECKERT and date of birth: [birth year].  Consent I sought verbal advanced consent from DRockford Bayfor virtual visit interactions. I informed Mr. BTiftof possible security and privacy concerns,  risks, and limitations associated with providing "not-in-person" medical evaluation and management services. I also informed Mr. BNasserof the availability of "in-person" appointments. Finally, I informed him that there would be a charge for the virtual visit and that he could be  personally, fully or partially, financially responsible for it. Mr. BHeslinexpressed understanding and agreed to proceed.   Historic Elements   Mr. DMABLE LASHLEYis a 60y.o. year old, male patient evaluated today after our last contact on 02/15/2020. Mr. BOehlert has a past medical history of Arthritis, COPD (chronic obstructive pulmonary disease) ([place], Depression, Diabetes mellitus without complication ([place], GERD (gastroesophageal reflux disease), History of kidney stones (1990's ), Hypertension, and Sleep apnea (01/30/2016). He also  has a past surgical history that includes Colonoscopy; Rectal polypectomy (4-5 yrs ago); Anterior cervical decomp/discectomy fusion (N/A, 07/18/2015); Esophagogastroduodenoscopy (egd) with propofol (N/A, 08/03/2019); Colonoscopy with propofol (N/A, 08/03/2019); Lower Extremity Angiography (Left, 02/07/2020); and leg stint. Mr. BSpanglerhas a current medication list which includes the following prescription(s): amlodipine-benazepril, aspirin ec, atorvastatin, citalopram, clopidogrel, esomeprazole, fluticasone, trelegy ellipta, furosemide, gabapentin, glimepiride, hydrocodone-acetaminophen, insulin glargine, metformin, multivitamin with minerals, nabumetone, onetouch verio, proair hfa, quinine, and spironolactone. He  reports that he has been smoking e-cigarettes. He has never used smokeless tobacco. He reports current alcohol use of about 3.0 standard drinks of alcohol per week. He reports that he does not use drugs. Mr. BOliverahas No Known Allergies.   HPI  Today, he is being contacted for a post-procedure assessment.  Post-Procedure Evaluation  Procedure (02/15/2020): Diagnostic midline to left  caudal ESI #2 + diagnostic epidurogram #2 under fluoroscopic guidance and IV sedation Pre-procedure pain level: 0/10 Post-procedure: 0/10          Sedation: Sedation  provided.  Effectiveness during initial hour after procedure(Ultra-Short Term Relief):  90%.  Local anesthetic used: Long-acting (4-6 hours) Effectiveness: Defined as any analgesic benefit obtained secondary to the administration of local anesthetics. This carries significant diagnostic value as to the etiological location, or anatomical origin, of the pain. Duration of benefit is expected to coincide with the duration of the local anesthetic used.  Effectiveness during initial 4-6 hours after procedure(Short-Term Relief):  90%.  Long-term benefit: Defined as any relief past the pharmacologic duration of the local anesthetics.  Effectiveness past the initial 6 hours after procedure(Long-Term Relief):  90%.  Current benefits: Defined as benefit that persist at this time.   Analgesia:  90-100% better Function: Mr. Lagrange reports improvement in function ROM: Mr. Rothlisberger reports improvement in ROM  Pharmacotherapy Assessment  Analgesic: Hydrocodone/APAP 10/325 1 tablet p.o. twice daily (20 mg/day of hydrocodone) (20 MME/day)  MME/day: 20 mg/day   Monitoring: [place] PMP: PDMP reviewed during this encounter.       Pharmacotherapy: No side-effects or adverse reactions reported. Compliance: No problems identified. Effectiveness: Clinically acceptable. Plan: Refer to "POC".  UDS:  Summary  Date Value Ref Range Status  10/10/2019 Note  Final    Comment:    ==================================================================== Compliance Drug Analysis, Ur ==================================================================== Test                             Result       Flag       Units  Drug Present and Declared for Prescription Verification   Gabapentin                     PRESENT      EXPECTED   Citalopram                      PRESENT      EXPECTED   Desmethylcitalopram            PRESENT      EXPECTED    Desmethylcitalopram is an expected metabolite of citalopram or the    enantiomeric form, escitalopram.    Acetaminophen                  PRESENT      EXPECTED  Drug Present not Declared for Prescription Verification   Hydrocodone                    2963         UNEXPECTED ng/mg creat   Hydromorphone                  135          UNEXPECTED ng/mg creat   Dihydrocodeine                 393          UNEXPECTED ng/mg creat   Norhydrocodone                 1000         UNEXPECTED ng/mg creat    Sources of hydrocodone include scheduled prescription medications.    Hydromorphone, dihydrocodeine and norhydrocodone are expected    metabolites of hydrocodone. Hydromorphone and dihydrocodeine are    also available as scheduled prescription medications.    Butorphanol                    PRESENT  UNEXPECTED  Drug Absent but Declared for Prescription Verification   Oxycodone                      Not Detected UNEXPECTED ng/mg creat   Cyclobenzaprine                Not Detected UNEXPECTED   Salicylate                     Not Detected UNEXPECTED    Aspirin, as indicated in the declared medication list, is not always    detected even when used as directed.  ==================================================================== Test                      Result    Flag   Units      Ref Range   Creatinine              185              mg/dL      >=20 ==================================================================== Declared Medications:  The flagging and interpretation on this report are based on the  following declared medications.  Unexpected results may arise from  inaccuracies in the declared medications.   **Note: The testing scope of this panel includes these medications:   Citalopram (Celexa)  Cyclobenzaprine (Flexeril)  Gabapentin (Neurontin)  Oxycodone (Percocet)   **Note: The testing scope of this  panel does not include small to  moderate amounts of these reported medications:   Acetaminophen (Percocet)  Aspirin   **Note: The testing scope of this panel does not include the  following reported medications:   Albuterol (Proair HFA)  Amlodipine (Lotrel)  Benazepril (Lotrel)  Budesonide (Symbicort)  Docusate (Colace)  Esomeprazole (Nexium)  Fluticasone (Trelegy)  Formoterol (Symbicort)  Furosemide (Lasix)  Glimepiride (Amaryl)  Insulin  Metformin (Glucophage)  Multivitamin  Quinine (Qualaquin)  Simvastatin (Zocor)  Umeclidinium (Trelegy)  Vilanterol (Trelegy) ==================================================================== For clinical consultation, please call [phone]. ====================================================================     Laboratory Chemistry Profile   Renal Lab Results  Component Value Date   BUN 21 02/07/2020   CREATININE 1.23 02/07/2020   BCR 14 10/10/2019   GFRAA 61 10/10/2019   GFRNONAA >60 02/07/2020     Hepatic Lab Results  Component Value Date   AST 30 10/10/2019   ALBUMIN 4.6 10/10/2019   ALKPHOS 59 10/10/2019     Electrolytes Lab Results  Component Value Date   NA 136 10/10/2019   K 5.7 (H) 10/10/2019   CL 100 10/10/2019   CALCIUM 9.8 10/10/2019   MG 2.0 10/10/2019     Bone Lab Results  Component Value Date   25OHVITD1 33 10/10/2019   25OHVITD2 <1.0 10/10/2019   25OHVITD3 33 10/10/2019     Inflammation (CRP: Acute Phase) (ESR: Chronic Phase) Lab Results  Component Value Date   CRP 2 10/10/2019   ESRSEDRATE 16 10/10/2019       Note: Above Lab results reviewed.  Imaging  VAS US AORTA/IVC/ILIACS ABDOMINAL AORTA STUDY  Vascular Interventions: 02/07/20 left pta stent in CIA.  Limitations: Air/bowel gas and obesity.    Performing Technologist: Concha Norway RVT    Examination Guidelines: A complete evaluation includes B-mode imaging, spectral Doppler, color Doppler, and power Doppler as  needed of all accessible portions of each vessel. Bilateral testing is considered an integral part of a complete examination. Limited examinations for reoccurring indications may be performed as noted.    Abdominal Aorta Findings: +-------------+-------+----------+----------+---------+--------+--------+  Location     AP (cm)Trans (cm)PSV (cm/s)Waveform ThrombusComments +-------------+-------+----------+----------+---------+--------+--------+ Proximal                      63                                  +-------------+-------+----------+----------+---------+--------+--------+ Mid                           67                                  +-------------+-------+----------+----------+---------+--------+--------+ Distal                        62                                  +-------------+-------+----------+----------+---------+--------+--------+ RT CIA Prox                   222       triphasic                 +-------------+-------+----------+----------+---------+--------+--------+ RT CIA Mid                    189       triphasic                 +-------------+-------+----------+----------+---------+--------+--------+ RT CIA Distal                 186       triphasic                 +-------------+-------+----------+----------+---------+--------+--------+ RT EIA Prox                   176       triphasic                 +-------------+-------+----------+----------+---------+--------+--------+ RT EIA Mid                    170       triphasic                 +-------------+-------+----------+----------+---------+--------+--------+ RT EIA Distal                 198       triphasic                 +-------------+-------+----------+----------+---------+--------+--------+ LT CIA Prox                   193       triphasic                  +-------------+-------+----------+----------+---------+--------+--------+ LT CIA Mid                    201       triphasic                 +-------------+-------+----------+----------+---------+--------+--------+ LT CIA Distal                 156       triphasic                 +-------------+-------+----------+----------+---------+--------+--------+  LT EIA Prox                   189       triphasic                 +-------------+-------+----------+----------+---------+--------+--------+ LT EIA Mid                    227       triphasic                 +-------------+-------+----------+----------+---------+--------+--------+ LT EIA Distal                 201       biphasic                  +-------------+-------+----------+----------+---------+--------+--------+     Summary: Abdominal Aorta: Limited views of abdominal aorta due to body habitus. No aneurysm identified. Mild atherosclerosis throughout the [place], CIA's, and EIA"s. Evidence of patent left CIA stent. Velocities suggest moderate stenosis throughout the CIA's and  EIA's.   *See table(s) above for measurements and observations.   Electronically signed by Hortencia Pilar MD on 03/08/2020 at 5:47:34 PM.       Final   VAS [place] ABI WITH/WO TBI LOWER EXTREMITY DOPPLER STUDY  Indications: Claudication, and left.   Vascular Interventions: 11/9/21Left PTA CIA stent.  Comparison Study: 01/12/20  Performing Technologist: Concha Norway RVT    Examination Guidelines: A complete evaluation includes at minimum, Doppler waveform signals and systolic blood pressure reading at the level of bilateral brachial, anterior tibial, and posterior tibial arteries, when vessel segments are accessible. Bilateral testing is considered an integral part of a complete examination. Photoelectric Plethysmograph (PPG) waveforms and toe systolic pressure readings are included as required and additional duplex  testing as needed. Limited examinations for reoccurring indications may be performed as noted.    ABI Findings: +---------+------------------+-----+---------+--------+ Right    Rt Pressure (mmHg)IndexWaveform Comment  +---------+------------------+-----+---------+--------+ Brachial 134                                      +---------+------------------+-----+---------+--------+ ATA      156               1.16 triphasic         +---------+------------------+-----+---------+--------+ PTA      151               1.13 triphasic         +---------+------------------+-----+---------+--------+ Great Toe136               1.01 Normal            +---------+------------------+-----+---------+--------+  +---------+------------------+-----+---------+-------+ Left     Lt Pressure (mmHg)IndexWaveform Comment +---------+------------------+-----+---------+-------+ ATA      156               1.16 biphasic         +---------+------------------+-----+---------+-------+ PTA      153               1.14 triphasic        +---------+------------------+-----+---------+-------+ Great Toe136               1.01 Normal           +---------+------------------+-----+---------+-------+  +-------+-----------+-----------+------------+------------+ ABI/TBIToday's ABIToday's TBIPrevious ABIPrevious TBI +-------+-----------+-----------+------------+------------+ Right  1.16       1.01       1.15        .  97          +-------+-----------+-----------+------------+------------+ Left   1.16       1.01       .67         .56          +-------+-----------+-----------+------------+------------+  Left ABIs and TBIs appear increased compared to prior study on 01/12/20.   Summary: Right: Resting right ankle-brachial index is within normal range. No evidence of significant right lower extremity arterial disease. The right toe-brachial index is  normal.  Left: Resting left ankle-brachial index is within normal range. No evidence of significant left lower extremity arterial disease. The left toe-brachial index is normal.    *See table(s) above for measurements and observations.    Electronically signed by Hortencia Pilar MD on 03/08/2020 at 5:47:32 PM.    Final    Assessment  The primary encounter diagnosis was Chronic pain syndrome. Diagnoses of Chronic low back pain (Left) w/o sciatica, Lumbar facet syndrome (Left), Chronic sacroiliac joint pain (Left), Pharmacologic therapy, and Chronic anticoagulation (Plavix) were also pertinent to this visit.  Plan of Care  Problem-specific:  No problem-specific Assessment & Plan notes found for this encounter.  Mr. RYKIN ROUTE has a current medication list which includes the following long-term medication(s): amlodipine-benazepril, atorvastatin, citalopram, esomeprazole, fluticasone, furosemide, gabapentin, glimepiride, insulin glargine, metformin, proair hfa, and spironolactone.  Pharmacotherapy (Medications Ordered): No orders of the defined types were placed in this encounter.  Orders:  Orders Placed This Encounter  Procedures  . LUMBAR FACET(MEDIAL BRANCH NERVE BLOCK) MBNB    Standing Status:   Future    Standing Expiration Date:   06/25/2020    Scheduling Instructions:     Procedure: Lumbar facet block (AKA.: Lumbosacral medial branch nerve block)     Side: Left-sided     Level: L3-4, L4-5, & L5-S1 Facets (L2, L3, L4, L5, & S1 Medial Branch Nerves)     Sedation: With Sedation.     Timeframe: ASAA    Order Specific Question:   Where will this procedure be performed?    Answer:   [facility]  . SACROILIAC JOINT INJECTION    Standing Status:   Future    Standing Expiration Date:   06/25/2020    Scheduling Instructions:     Side: Left-sided     Sedation: With Sedation.     Timeframe: ASAA    Order Specific Question:   Where will this procedure be performed?     Answer:   [facility]  . Blood Thinner Instructions to Nursing    Always make sure patient has clearance from prescribing physician to stop blood thinners for interventional therapies. If the patient requires a Lovenox-bridge therapy, make sure arrangements are made to institute it with the assistance of the PCP.    Scheduling Instructions:     Have Mr. Staron stop the Plavix (Clopidogrel) x 7-10 days prior to procedure or surgery.   Follow-up plan:   Return for Procedure (w/ sedation): (L) L-FCT BLK ##1 + (L) SI BLK #1, (Blood Thinner Protocol).      Interventional management options: Planned, scheduled, and/or pending:    Diagnostic left lumbar facet block #1 + diagnostic left SI joint block #1 under fluoroscopic guidance and IV sedation   Considering:   NOTE: PLAVIX ANTICOAGULATION (Stop: 7-10 days  Restart: 2 hours) Diagnostic left lumbar facet block #1  Diagnostic left SI joint block #1  Therapeutic (ML) caudal ESI #2  Therapeutic Racz procedure #1  Diagnostic bilateral lumbar spinal cord stimulator trial    PRN Procedures:   Palliative (ML) caudal ESI #2     Recent Visits No visits were found meeting these conditions. Showing recent visits within past 90 days and meeting all other requirements Today's Visits Date Type Provider Dept  05/28/20 Telemedicine Milinda Pointer, MD [facility]  Showing today's visits and meeting all other requirements Future Appointments No visits were found meeting these conditions. Showing future appointments within next 90 days and meeting all other requirements  I discussed the assessment and treatment plan with the patient. The patient was provided an opportunity to ask questions and all were answered. The patient agreed with the plan and demonstrated an understanding of the instructions.  Patient advised to call back or seek an in-person evaluation if the symptoms or condition worsens.  Duration of encounter: 12  minutes.  Note by: Gaspar Cola, MD Date: 05/28/2020; Time: 5:34 PM

## 2020-05-28 ENCOUNTER — Other Ambulatory Visit: Payer: Self-pay

## 2020-05-28 ENCOUNTER — Ambulatory Visit: Payer: Medicare HMO | Attending: Pain Medicine | Admitting: Pain Medicine

## 2020-05-28 ENCOUNTER — Encounter: Payer: Self-pay | Admitting: Pain Medicine

## 2020-05-28 DIAGNOSIS — M533 Sacrococcygeal disorders, not elsewhere classified: Secondary | ICD-10-CM | POA: Diagnosis not present

## 2020-05-28 DIAGNOSIS — G894 Chronic pain syndrome: Secondary | ICD-10-CM

## 2020-05-28 DIAGNOSIS — M47816 Spondylosis without myelopathy or radiculopathy, lumbar region: Secondary | ICD-10-CM | POA: Diagnosis not present

## 2020-05-28 DIAGNOSIS — G8929 Other chronic pain: Secondary | ICD-10-CM

## 2020-05-28 DIAGNOSIS — M545 Low back pain, unspecified: Secondary | ICD-10-CM | POA: Diagnosis not present

## 2020-05-28 DIAGNOSIS — Z79899 Other long term (current) drug therapy: Secondary | ICD-10-CM

## 2020-05-28 DIAGNOSIS — Z7901 Long term (current) use of anticoagulants: Secondary | ICD-10-CM

## 2020-05-28 NOTE — Patient Instructions (Signed)
____________________________________________________________________________________________  Blood Thinners  IMPORTANT NOTICE:  If you take any of these, make sure to notify the nursing staff.  Failure to do so may result in injury.  Recommended time intervals to stop and restart blood-thinners, before & after invasive procedures  Generic Name Brand Name Stop Time. Must be stopped at least this long before procedures. After procedures, wait at least this long before re-starting.  Abciximab Reopro 15 days 2 hrs  Alteplase Activase 10 days 10 days  Anagrelide Agrylin    Apixaban Eliquis 3 days 6 hrs  Cilostazol Pletal 3 days 5 hrs  Clopidogrel Plavix 7-10 days 2 hrs  Dabigatran Pradaxa 5 days 6 hrs  Dalteparin Fragmin 24 hours 4 hrs  Dipyridamole Aggrenox 11days 2 hrs  Edoxaban Lixiana; Savaysa 3 days 2 hrs  Enoxaparin  Lovenox 24 hours 4 hrs  Eptifibatide Integrillin 8 hours 2 hrs  Fondaparinux  Arixtra 72 hours 12 hrs  Hydroxychloroquine Plaquenil 11 days   Prasugrel Effient 7-10 days 6 hrs  Reteplase Retavase 10 days 10 days  Rivaroxaban Xarelto 3 days 6 hrs  Ticagrelor Brilinta 5-7 days 6 hrs  Ticlopidine Ticlid 10-14 days 2 hrs  Tinzaparin Innohep 24 hours 4 hrs  Tirofiban Aggrastat 8 hours 2 hrs  Warfarin Coumadin 5 days 2 hrs   Other medications with blood-thinning effects  Product indications Generic (Brand) names Note  Cholesterol Lipitor Stop 4 days before procedure  Blood thinner (injectable) Heparin (LMW or LMWH Heparin) Stop 24 hours before procedure  Cancer Ibrutinib (Imbruvica) Stop 7 days before procedure  Malaria/Rheumatoid Hydroxychloroquine (Plaquenil) Stop 11 days before procedure  Thrombolytics  10 days before or after procedures   Over-the-counter (OTC) Products with blood-thinning effects  Product Common names Stop Time  Aspirin > 325 mg Goody Powders, Excedrin, etc. 11 days  Aspirin ? 81 mg  7 days  Fish oil  4 days  Garlic supplements  7 days   Ginkgo biloba  36 hours  Ginseng  24 hours  NSAIDs Ibuprofen, Naprosyn, etc. 3 days  Vitamin E  4 days   ____________________________________________________________________________________________  ____________________________________________________________________________________________  Preparing for Procedure with Sedation  Procedure appointments are limited to planned procedures: . No Prescription Refills. . No disability issues will be discussed. . No medication changes will be discussed.  Instructions: . Oral Intake: Do not eat or drink anything for at least 8 hours prior to your procedure. (Exception: Blood Pressure Medication. See below.) . Transportation: Unless otherwise stated by your physician, you may drive yourself after the procedure. . Blood Pressure Medicine: Do not forget to take your blood pressure medicine with a sip of water the morning of the procedure. If your Diastolic (lower reading)is above 100 mmHg, elective cases will be cancelled/rescheduled. . Blood thinners: These will need to be stopped for procedures. Notify our staff if you are taking any blood thinners. Depending on which one you take, there will be specific instructions on how and when to stop it. . Diabetics on insulin: Notify the staff so that you can be scheduled 1st case in the morning. If your diabetes requires high dose insulin, take only  of your normal insulin dose the morning of the procedure and notify the staff that you have done so. . Preventing infections: Shower with an antibacterial soap the morning of your procedure. . Build-up your immune system: Take 1000 mg of Vitamin C with every meal (3 times a day) the day prior to your procedure. . Antibiotics: Inform the staff if you have   a condition or reason that requires you to take antibiotics before dental procedures. . Pregnancy: If you are pregnant, call and cancel the procedure. . Sickness: If you have a cold, fever, or any active  infections, call and cancel the procedure. . Arrival: You must be in the facility at least 30 minutes prior to your scheduled procedure. . Children: Do not bring children with you. . Dress appropriately: Bring dark clothing that you would not mind if they get stained. . Valuables: Do not bring any jewelry or valuables.  Reasons to call and reschedule or cancel your procedure: (Following these recommendations will minimize the risk of a serious complication.) . Surgeries: Avoid having procedures within 2 weeks of any surgery. (Avoid for 2 weeks before or after any surgery). . Flu Shots: Avoid having procedures within 2 weeks of a flu shots or . (Avoid for 2 weeks before or after immunizations). . Barium: Avoid having a procedure within 7-10 days after having had a radiological study involving the use of radiological contrast. (Myelograms, Barium swallow or enema study). . Heart attacks: Avoid any elective procedures or surgeries for the initial 6 months after a "Myocardial Infarction" (Heart Attack). . Blood thinners: It is imperative that you stop these medications before procedures. Let us know if you if you take any blood thinner.  . Infection: Avoid procedures during or within two weeks of an infection (including chest colds or gastrointestinal problems). Symptoms associated with infections include: Localized redness, fever, chills, night sweats or profuse sweating, burning sensation when voiding, cough, congestion, stuffiness, runny nose, sore throat, diarrhea, nausea, vomiting, cold or Flu symptoms, recent or current infections. It is specially important if the infection is over the area that we intend to treat. . Heart and lung problems: Symptoms that may suggest an active cardiopulmonary problem include: cough, chest pain, breathing difficulties or shortness of breath, dizziness, ankle swelling, uncontrolled high or unusually low blood pressure, and/or palpitations. If you are experiencing any of  these symptoms, cancel your procedure and contact your primary care physician for an evaluation.  Remember:  Regular Business hours are:  Monday to Thursday 8:00 AM to 4:00 PM  Provider's Schedule: Cathren Sween, MD:  Procedure days: Tuesday and Thursday 7:30 AM to 4:00 PM  Bilal Lateef, MD:  Procedure days: Monday and Wednesday 7:30 AM to 4:00 PM ____________________________________________________________________________________________    

## 2020-06-09 NOTE — Progress Notes (Signed)
PROVIDER NOTE: Information contained herein reflects review and annotations entered in association with encounter. Interpretation of such information and data should be left to medically-trained personnel. Information provided to patient can be located elsewhere in the medical record under "Patient Instructions". Document created using STT-dictation technology, any transcriptional errors that may result from process are unintentional.    Patient: Darin Waters  Service Category: Procedure  Provider: Oswaldo Done, MD  DOB: May 23, 1960  DOS: 06/14/2020  Location: [facility]  MRN: [id number]  Setting: Ambulatory - outpatient  Referring Provider: Myrene Waters, *  Type: Established Patient  Specialty: Interventional Pain Management  PCP: Darin Buddy, NP   Primary Reason for Visit: Interventional Pain Management Treatment. CC: Back Pain  Procedure:          Anesthesia, Analgesia, Anxiolysis:  Type: Lumbar Facet, Medial Branch Block(s) #1  Primary Purpose: Diagnostic Region: Posterolateral Lumbosacral Spine Level: L2, L3, L4, L5, & S1 Medial Branch Level(s). Injecting these levels blocks the L3-4, L4-5, and L5-S1 lumbar facet joints. Laterality: Left  Type: Moderate (Conscious) Sedation combined with Local Anesthesia Indication(s): Analgesia and Anxiety Route: Intravenous (IV) IV Access: Secured Sedation: Meaningful verbal contact was maintained at all times during the procedure  Local Anesthetic: Lidocaine 1-2%  Position: Prone   Indications: 1. Lumbar facet syndrome (Left)   2. Spondylosis without myelopathy or radiculopathy, lumbosacral region   3. Chronic low back pain (Left) w/o sciatica   4. DDD (degenerative disc disease), lumbar   5. 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 Compression fracture of L1 lumbar vertebra, sequela   7. Abnormal MRI, lumbar spine (09/13/2018)   8. Epidural fibrosis   9. History  of lumbar spinal fusion   10. Chronic anticoagulation (Plavix)    Pain Score: Pre-procedure: 3 /10 Post-procedure: 0-No pain/10   Pre-op H&P Assessment:  [name] is a 60 y.o. (year old), male patient, seen today for interventional treatment. He  has a past surgical history that includes Colonoscopy; Rectal polypectomy (4-5 yrs ago); Anterior cervical decomp/discectomy fusion (N/A, 07/18/2015); Esophagogastroduodenoscopy (egd) with propofol (N/A, 08/03/2019); Colonoscopy with propofol (N/A, 08/03/2019); Lower Extremity Angiography (Left, 02/07/2020); and leg stint. [name] has a current medication list which includes the following prescription(s): amlodipine-benazepril, aspirin ec, atorvastatin, citalopram, clopidogrel, esomeprazole, fluticasone, trelegy ellipta, furosemide, gabapentin, glimepiride, hydrocodone-acetaminophen, insulin glargine, metformin, multivitamin with minerals, nabumetone, onetouch verio, proair hfa, quinine, and spironolactone, and the following Facility-Administered Medications: fentanyl and midazolam. His primarily concern today is the Back Pain  Initial Vital Signs:  Pulse/HCG Rate: 64ECG Heart Rate: 61 Temp: (!) 96.9 F (36.1 C) Resp: 18 BP: (!) 114/51 SpO2: 97 %  BMI: Estimated body mass index is 35.83 kg/m as calculated from the following:   Height as of this encounter:  (1.676 m).   Weight as of this encounter: 222 lb (100.7 kg).  Risk Assessment: Allergies: Reviewed. He has No Known Allergies.  Allergy Precautions: None required Coagulopathies: Reviewed. None identified.  Blood-thinner therapy: None at this time Active Infection(s): Reviewed. None identified. [name] is afebrile  Site Confirmation: [name] was asked to confirm the procedure and laterality before marking the site Procedure checklist: Completed Consent: Before the procedure and under the influence of no sedative(s), amnesic(s), or anxiolytics, the patient was informed of the  treatment options, risks and possible complications. To fulfill our ethical and legal obligations, as recommended by the American Medical Association's Code of Ethics, I have informed the patient  of my clinical impression; the nature and purpose of the treatment or procedure; the risks, benefits, and possible complications of the intervention; the alternatives, including doing nothing; the risk(s) and benefit(s) of the alternative treatment(s) or procedure(s); and the risk(s) and benefit(s) of doing nothing. The patient was provided information about the general risks and possible complications associated with the procedure. These may include, but are not limited to: failure to achieve desired goals, infection, bleeding, organ or nerve damage, allergic reactions, paralysis, and death. In addition, the patient was informed of those risks and complications associated to Spine-related procedures, such as failure to decrease pain; infection (i.e.: Meningitis, epidural or intraspinal abscess); bleeding (i.e.: epidural hematoma, subarachnoid hemorrhage, or any other type of intraspinal or peri-dural bleeding); organ or nerve damage (i.e.: Any type of peripheral nerve, nerve root, or spinal cord injury) with subsequent damage to sensory, motor, and/or autonomic systems, resulting in permanent pain, numbness, and/or weakness of one or several areas of the body; allergic reactions; (i.e.: anaphylactic reaction); and/or death. Furthermore, the patient was informed of those risks and complications associated with the medications. These include, but are not limited to: allergic reactions (i.e.: anaphylactic or anaphylactoid reaction(s)); adrenal axis suppression; blood sugar elevation that in diabetics may result in ketoacidosis or comma; water retention that in patients with history of congestive heart failure may result in shortness of breath, pulmonary edema, and decompensation with resultant heart failure; weight gain;  swelling or edema; medication-induced neural toxicity; particulate matter embolism and blood vessel occlusion with resultant organ, and/or nervous system infarction; and/or aseptic necrosis of one or more joints. Finally, the patient was informed that Medicine is not an exact science; therefore, there is also the possibility of unforeseen or unpredictable risks and/or possible complications that may result in a catastrophic outcome. The patient indicated having understood very clearly. We have given the patient no guarantees and we have made no promises. Enough time was given to the patient to ask questions, all of which were answered to the patient's satisfaction. Mr. Wittmeyer has indicated that he wanted to continue with the procedure. Attestation: I, the ordering provider, attest that I have discussed with the patient the benefits, risks, side-effects, alternatives, likelihood of achieving goals, and potential problems during recovery for the procedure that I have provided informed consent. Date   Time: 06/14/2020 10:51 AM  Pre-Procedure Preparation:  Monitoring: As per clinic protocol. Respiration, ETCO2, SpO2, BP, heart rate and rhythm monitor placed and checked for adequate function Safety Precautions: Patient was assessed for positional comfort and pressure points before starting the procedure. Time-out: I initiated and conducted the "Time-out" before starting the procedure, as per protocol. The patient was asked to participate by confirming the accuracy of the "Time Out" information. Verification of the correct person, site, and procedure were performed and confirmed by me, the nursing staff, and the patient. "Time-out" conducted as per Joint Commission's Universal Protocol (UP.01.01.01). Time: 1112  Description of Procedure:          Laterality: Left Levels:  L2, L3, L4, L5, & S1 Medial Branch Level(s) Area Prepped: Posterior Lumbosacral Region DuraPrep (Iodine Povacrylex [0.7% available  iodine] and Isopropyl Alcohol, 74% w/w) Safety Precautions: Aspiration looking for blood return was conducted prior to all injections. At no point did we inject any substances, as a needle was being advanced. Before injecting, the patient was told to immediately notify me if he was experiencing any new onset of "ringing in the ears, or metallic taste in the mouth". No attempts  were made at seeking any paresthesias. Safe injection practices and needle disposal techniques used. Medications properly checked for expiration dates. SDV (single dose vial) medications used. After the completion of the procedure, all disposable equipment used was discarded in the proper designated medical waste containers. Local Anesthesia: Protocol guidelines were followed. The patient was positioned over the fluoroscopy table. The area was prepped in the usual manner. The time-out was completed. The target area was identified using fluoroscopy. A 12-in long, straight, sterile hemostat was used with fluoroscopic guidance to locate the targets for each level blocked. Once located, the skin was marked with an approved surgical skin marker. Once all sites were marked, the skin (epidermis, dermis, and hypodermis), as well as deeper tissues (fat, connective tissue and muscle) were infiltrated with a small amount of a short-acting local anesthetic, loaded on a 10cc syringe with a 25G, 1.5-in  Needle. An appropriate amount of time was allowed for local anesthetics to take effect before proceeding to the next step. Local Anesthetic: Lidocaine 2.0% The unused portion of the local anesthetic was discarded in the proper designated containers. Technical explanation of process:  L2 Medial Branch Nerve Block (MBB): The target area for the L2 medial branch is at the junction of the postero-lateral aspect of the superior articular process and the superior, posterior, and medial edge of the transverse process of L3. Under fluoroscopic guidance, a  Quincke needle was inserted until contact was made with os over the superior postero-lateral aspect of the pedicular shadow (target area). After negative aspiration for blood, 0.5 mL of the nerve block solution was injected without difficulty or complication. The needle was removed intact. L3 Medial Branch Nerve Block (MBB): The target area for the L3 medial branch is at the junction of the postero-lateral aspect of the superior articular process and the superior, posterior, and medial edge of the transverse process of L4. Under fluoroscopic guidance, a Quincke needle was inserted until contact was made with os over the superior postero-lateral aspect of the pedicular shadow (target area). After negative aspiration for blood, 0.5 mL of the nerve block solution was injected without difficulty or complication. The needle was removed intact. L4 Medial Branch Nerve Block (MBB): The target area for the L4 medial branch is at the junction of the postero-lateral aspect of the superior articular process and the superior, posterior, and medial edge of the transverse process of L5. Under fluoroscopic guidance, a Quincke needle was inserted until contact was made with os over the superior postero-lateral aspect of the pedicular shadow (target area). After negative aspiration for blood, 0.5 mL of the nerve block solution was injected without difficulty or complication. The needle was removed intact. L5 Medial Branch Nerve Block (MBB): The target area for the L5 medial branch is at the junction of the postero-lateral aspect of the superior articular process and the superior, posterior, and medial edge of the sacral ala. Under fluoroscopic guidance, a Quincke needle was inserted until contact was made with os over the superior postero-lateral aspect of the pedicular shadow (target area). After negative aspiration for blood, 0.5 mL of the nerve block solution was injected without difficulty or complication. The needle was  removed intact. S1 Medial Branch Nerve Block (MBB): The target area for the S1 medial branch is at the posterior and inferior 6 o'clock position of the L5-S1 facet joint. Under fluoroscopic guidance, the Quincke needle inserted for the L5 MBB was redirected until contact was made with os over the inferior and postero aspect of  the sacrum, at the 6 o' clock position under the L5-S1 facet joint (Target area). After negative aspiration for blood, 0.5 mL of the nerve block solution was injected without difficulty or complication. The needle was removed intact.  Nerve block solution: 0.2% PF-Ropivacaine + Triamcinolone (40 mg/mL) diluted to a final concentration of 4 mg of Triamcinolone/mL of Ropivacaine The unused portion of the solution was discarded in the proper designated containers. Procedural Needles: 22-gauge, 3.5-inch, Quincke needles used for all levels.  Once the entire procedure was completed, the treated area was cleaned, making sure to leave some of the prepping solution back to take advantage of its long term bactericidal properties.   Illustration of the posterior view of the lumbar spine and the posterior neural structures. Laminae of L2 through S1 are labeled. DPRL5, dorsal primary ramus of L5; DPRS1, dorsal primary ramus of S1; DPR3, dorsal primary ramus of L3; FJ, facet (zygapophyseal) joint L3-L4; I, inferior articular process of L4; LB1, lateral branch of dorsal primary ramus of L1; IAB, inferior articular branches from L3 medial branch (supplies L4-L5 facet joint); IBP, intermediate branch plexus; MB3, medial branch of dorsal primary ramus of L3; NR3, third lumbar nerve root; S, superior articular process of L5; SAB, superior articular branches from L4 (supplies L4-5 facet joint also); TP3, transverse process of L3.  Vitals:   06/14/20 1118 06/14/20 1128 06/14/20 1138 06/14/20 1147  BP: 125/73 (!) 108/59 (!) 116/58 (!) 105/55  Pulse:      Resp: Temp:  (!) 97.1 F (36.2  C)  (!) 97.1 F (36.2 C)  TempSrc:  Tympanic  Temporal  SpO2: 95% 92% 96% 97%  Weight:      Height:         Start Time: 1112 hrs. End Time: 1118 hrs.  Imaging Guidance (Spinal):          Type of Imaging Technique: Fluoroscopy Guidance (Spinal) Indication(s): Assistance in needle guidance and placement for procedures requiring needle placement in or near specific anatomical locations not easily accessible without such assistance. Exposure Time: Please see nurses notes. Contrast: None used. Fluoroscopic Guidance: I was personally present during the use of fluoroscopy. "Tunnel Vision Technique" used to obtain the best possible view of the target area. Parallax error corrected before commencing the procedure. "Direction-depth-direction" technique used to introduce the needle under continuous pulsed fluoroscopy. Once target was reached, antero-posterior, oblique, and lateral fluoroscopic projection used confirm needle placement in all planes. Images permanently stored in EMR. Interpretation: No contrast injected. I personally interpreted the imaging intraoperatively. Adequate needle placement confirmed in multiple planes. Permanent images saved into the patient's record.  Antibiotic Prophylaxis:   Anti-infectives (From admission, onward)   None     Indication(s): None identified  Post-operative Assessment:  Post-procedure Vital Signs:  Pulse/HCG Rate: 6460 Temp: (!) 97.1 F (36.2 C) Resp: 16 BP: (!) 105/55 SpO2: 97 %  EBL: None  Complications: No immediate post-treatment complications observed by team, or reported by patient.  Note: The patient tolerated the entire procedure well. A repeat set of vitals were taken after the procedure and the patient was kept under observation following institutional policy, for this type of procedure. Post-procedural neurological assessment was performed, showing return to baseline, prior to discharge. The patient was provided with post-procedure  discharge instructions, including a section on how to identify potential problems. Should any problems arise concerning this procedure, the patient was given instructions to immediately contact us, at any time, without hesitation. In any  case, we plan to contact the patient by telephone for a follow-up status report regarding this interventional procedure.  Comments:  No additional relevant information.  Plan of Care  Orders:  Orders Placed This Encounter  Procedures   LUMBAR FACET(MEDIAL BRANCH NERVE BLOCK) MBNB    Scheduling Instructions:     Procedure: Lumbar facet block (AKA.: Lumbosacral medial branch nerve block)     Side: Left-sided     Level: L3-4, L4-5, & L5-S1 Facets (L2, L3, L4, L5, & S1 Medial Branch Nerves)     Sedation: Patient's choice.     Timeframe: Today    Order Specific Question:   Where will this procedure be performed?    Answer:   [facility]   DG [facility] C-ARM 1-60 MIN NO REPORT    Intraoperative interpretation by procedural physician at [facility].    Standing Status:   Standing    Number of Occurrences:   1    Order Specific Question:   Reason for exam:    Answer:   Assistance in needle guidance and placement for procedures requiring needle placement in or near specific anatomical locations not easily accessible without such assistance.   Informed Consent Details: Physician/Practitioner Attestation; Transcribe to consent form and obtain patient signature    Nursing Order: Transcribe to consent form and obtain patient signature. Note: Always confirm laterality of pain with Mr. Notz, before procedure.    Order Specific Question:   Physician/Practitioner attestation of informed consent for procedure/surgical case    Answer:   I, the physician/practitioner, attest that I have discussed with the patient the benefits, risks, side effects, alternatives, likelihood of achieving goals and potential problems during recovery for the procedure  that I have provided informed consent.    Order Specific Question:   Procedure    Answer:   Lumbar Facet Block  under fluoroscopic guidance    Order Specific Question:   Physician/Practitioner performing the procedure    Answer:   Jammal Sarr A. Laban Emperor MD    Order Specific Question:   Indication/Reason    Answer:   Low Back Pain, with our without leg pain, due to Facet Joint Arthralgia (Joint Pain) Spondylosis (Arthritis of the Spine), without myelopathy or radiculopathy (Nerve Damage).   Care order/instruction: Please confirm that the patient has stopped the Plavix (Clopidogrel) x 7-10 days prior to procedure or surgery.    Please confirm that the patient has stopped the Plavix (Clopidogrel) x 7-10 days prior to procedure or surgery.    Standing Status:   Standing    Number of Occurrences:   1   Provide equipment / supplies at bedside    "Block Tray" (Disposable   single use) Needle type: SpinalSpinal Amount/quantity:  4  Size: Regular (3.5-inch) Gauge: 22G    Standing Status:   Standing    Number of Occurrences:   1    Order Specific Question:   Specify    Answer:   Block Tray   Bleeding precautions    Standing Status:   Standing    Number of Occurrences:   1   Chronic Opioid Analgesic:  Hydrocodone/APAP 10/325 1 tablet p.o. twice daily (20 mg/day of hydrocodone) (20 MME/day)  MME/day: 20 mg/day   Medications ordered for procedure: Meds ordered this encounter  Medications   lidocaine (XYLOCAINE) 2 % (with pres) injection 400 mg   lactated ringers infusion 1,000 mL   midazolam (VERSED) 5 MG/5ML injection 1-2 mg    Make sure Flumazenil  is available in the pyxis when using this medication. If oversedation occurs, administer 0.2 mg IV over 15 sec. If after 45 sec no response, administer 0.2 mg again over 1 min; may repeat at 1 min intervals; not to exceed 4 doses (1 mg)   fentaNYL (SUBLIMAZE) injection 25-50 mcg    Make sure Narcan is available in the pyxis when using this  medication. In the event of respiratory depression (RR< 8/min): Titrate NARCAN (naloxone) in increments of 0.1 to 0.2 mg IV at 2-3 minute intervals, until desired degree of reversal.   ropivacaine (PF) 2 mg/mL (0.2%) (NAROPIN) injection 9 mL   triamcinolone acetonide (KENALOG-40) injection 40 mg   Medications administered: We administered lidocaine, lactated ringers, midazolam, fentaNYL, ropivacaine (PF) 2 mg/mL (0.2%), and triamcinolone acetonide.  See the medical record for exact dosing, route, and time of administration.  Follow-up plan:   Return in about 2 weeks (around 06/28/2020) for on afternoon of procedure day, (F2F), (PPE).      Interventional Therapies  Risk   Complexity Considerations:   NOTE: Plavix Anticoagulation (Stop: 7-10 days   Restart: 2 hours) Prior patient of Dr. Yves Dill  Uncontrolled type 2 diabetes with hyperglycemia  Nicotine dependence   tobacco abuse  COPD   SOB on exertion  CAD   hypertension   Planned   Pending:   Diagnostic left lumbar facet block #1    Under consideration:   Diagnostic left lumbar facet block #1  Diagnostic left SI joint block #1  Therapeutic Racz procedure #1  Diagnostic bilateral lumbar spinal cord stimulator trial    Completed:   Diagnostic left caudal ESI + epidurogram x2 (01/10/2020) (100/100/1 100 x 2 weeks/50) (90/90/90/100)   Therapeutic   Palliative (PRN) options:   Therapeutic (ML) caudal ESI #3     Recent Visits Date Type Provider Dept  05/28/20 Telemedicine Delano Metz, MD [facility]  Showing recent visits within past 90 days and meeting all other requirements Today's Visits Date Type Provider Dept  06/14/20 Procedure visit Delano Metz, MD [facility]  Showing today's visits and meeting all other requirements Future Appointments Date Type Provider Dept  06/28/20 Appointment Delano Metz, MD [facility]  Showing future appointments within next 90 days and  meeting all other requirements  Disposition: Discharge home  Discharge (Date   Time): 06/14/2020; 1150 hrs.   Primary Care Physician: Darin Buddy, NP Location: [facility] Note by: Darin Done, MD Date: 06/14/2020; Time: 11:59 AM  Disclaimer:  Medicine is not an Visual merchandiser. The only guarantee in medicine is that nothing is guaranteed. It is important to note that the decision to proceed with this intervention was based on the information collected from the patient. The Data and conclusions were drawn from the patient's questionnaire, the interview, and the physical examination. Because the information was provided in large part by the patient, it cannot be guaranteed that it has not been purposely or unconsciously manipulated. Every effort has been made to obtain as much relevant data as possible for this evaluation. It is important to note that the conclusions that lead to this procedure are derived in large part from the available data. Always take into account that the treatment will also be dependent on availability of resources and existing treatment guidelines, considered by other Pain Management Practitioners as being common knowledge and practice, at the time of the intervention. For Medico-Legal purposes, it is also important to point out that variation in  procedural techniques and pharmacological choices are the acceptable norm. The indications, contraindications, technique, and results of the above procedure should only be interpreted and judged by a Board-Certified Interventional Pain Specialist with extensive familiarity and expertise in the same exact procedure and technique.

## 2020-06-14 ENCOUNTER — Other Ambulatory Visit: Payer: Self-pay

## 2020-06-14 ENCOUNTER — Ambulatory Visit (HOSPITAL_BASED_OUTPATIENT_CLINIC_OR_DEPARTMENT_OTHER): Payer: Medicare HMO | Admitting: Pain Medicine

## 2020-06-14 ENCOUNTER — Ambulatory Visit
Admission: RE | Admit: 2020-06-14 | Discharge: 2020-06-14 | Disposition: A | Discharge status: Home / Self Care | Payer: Medicare HMO | Source: Ambulatory Visit | Attending: Pain Medicine | Admitting: Pain Medicine

## 2020-06-14 VITALS — BP 105/55 | HR 64 | Temp 97.1°F | Resp 16 | Ht 66.0 in | Wt 222.0 lb

## 2020-06-14 DIAGNOSIS — M47817 Spondylosis without myelopathy or radiculopathy, lumbosacral region: Secondary | ICD-10-CM | POA: Insufficient documentation

## 2020-06-14 DIAGNOSIS — S32010S Wedge compression fracture of first lumbar vertebra, sequela: Secondary | ICD-10-CM | POA: Insufficient documentation

## 2020-06-14 DIAGNOSIS — Z981 Arthrodesis status: Secondary | ICD-10-CM

## 2020-06-14 DIAGNOSIS — M545 Low back pain, unspecified: Secondary | ICD-10-CM | POA: Diagnosis present

## 2020-06-14 DIAGNOSIS — R937 Abnormal findings on diagnostic imaging of other parts of musculoskeletal system: Secondary | ICD-10-CM | POA: Insufficient documentation

## 2020-06-14 DIAGNOSIS — Z7901 Long term (current) use of anticoagulants: Secondary | ICD-10-CM | POA: Diagnosis present

## 2020-06-14 DIAGNOSIS — M5136 Other intervertebral disc degeneration, lumbar region: Secondary | ICD-10-CM | POA: Diagnosis present

## 2020-06-14 DIAGNOSIS — G96198 Other disorders of meninges, not elsewhere classified: Secondary | ICD-10-CM | POA: Insufficient documentation

## 2020-06-14 DIAGNOSIS — G8929 Other chronic pain: Secondary | ICD-10-CM | POA: Diagnosis present

## 2020-06-14 DIAGNOSIS — M47816 Spondylosis without myelopathy or radiculopathy, lumbar region: Secondary | ICD-10-CM | POA: Diagnosis not present

## 2020-06-14 DIAGNOSIS — M51369 Other intervertebral disc degeneration, lumbar region without mention of lumbar back pain or lower extremity pain: Secondary | ICD-10-CM

## 2020-06-14 MED ORDER — LACTATED RINGERS IV SOLN
1000.0000 mL | Freq: Once | INTRAVENOUS | Status: AC
  Administered 2020-06-14: 1000 mL via INTRAVENOUS

## 2020-06-14 MED ORDER — LIDOCAINE HCL 2 % IJ SOLN
20.0000 mL | Freq: Once | INTRAMUSCULAR | Status: AC
  Administered 2020-06-14: 400 mg
  Filled 2020-06-14: qty 40

## 2020-06-14 MED ORDER — ROPIVACAINE HCL 2 MG/ML IJ SOLN
9.0000 mL | Freq: Once | INTRAMUSCULAR | Status: AC
  Administered 2020-06-14: 9 mL via PERINEURAL
  Filled 2020-06-14: qty 10

## 2020-06-14 MED ORDER — TRIAMCINOLONE ACETONIDE 40 MG/ML IJ SUSP
40.0000 mg | Freq: Once | INTRAMUSCULAR | Status: AC
  Administered 2020-06-14: 40 mg
  Filled 2020-06-14: qty 1

## 2020-06-14 MED ORDER — MIDAZOLAM HCL 5 MG/5ML IJ SOLN
1.0000 mg | INTRAMUSCULAR | Status: DC | PRN
  Administered 2020-06-14: 2 mg via INTRAVENOUS
  Filled 2020-06-14: qty 5

## 2020-06-14 MED ORDER — FENTANYL CITRATE (PF) 100 MCG/2ML IJ SOLN
25.0000 ug | INTRAMUSCULAR | Status: DC | PRN
  Administered 2020-06-14: 50 ug via INTRAVENOUS
  Filled 2020-06-14: qty 2

## 2020-06-14 NOTE — Patient Instructions (Signed)

## 2020-06-14 NOTE — Progress Notes (Signed)
Safety precautions to be maintained throughout the outpatient stay will include: orient to surroundings, keep bed in low position, maintain call bell within reach at all times, provide assistance with transfer out of bed and ambulation.  

## 2020-06-15 ENCOUNTER — Telehealth: Payer: Self-pay

## 2020-06-15 NOTE — Telephone Encounter (Signed)
Post procedure phone call.  LM 

## 2020-06-27 NOTE — Progress Notes (Signed)
PROVIDER NOTE: Information contained herein reflects review and annotations entered in association with encounter. Interpretation of such information and data should be left to medically-trained personnel. Information provided to patient can be located elsewhere in the medical record under "Patient Instructions". Document created using STT-dictation technology, any transcriptional errors that may result from process are unintentional.    Patient: Darin Waters  Service Category: E/M  Provider: Gaspar Cola, MD  DOB: 1960-09-22  DOS: 06/28/2020  Specialty: Interventional Pain Management  MRN: [id number]  Setting: Ambulatory outpatient  PCP: Sallee Lange, NP  Type: Established Patient    Referring Provider: Sallee Lange, *  Location: Office  Delivery: Face-to-face     HPI  Darin Waters, a 60 y.o. year old male, is here today because of his Lumbar facet joint syndrome [M47.816]. Darin Waters primary complain today is Back Pain (Left lumbar ) Last encounter: My last encounter with him was on 06/14/2020. Pertinent problems: Darin Waters has Cervical herniated disc; Spondylolisthesis of lumbar region; Chronic pain syndrome; DDD (degenerative disc disease), lumbar; Lumbar radiculitis (S1 dermatome) (Bilateral) (L>R); Chronic low back pain (2ry area of Pain) (Bilateral) (L>R) w/ sciatica (Bilateral); Failed back surgical syndrome; Chronic lower extremity pain (1ry area of Pain) (Bilateral) (L>R); PAD (peripheral artery disease) ([place]); Lumbar facet syndrome (Left); Chronic sacroiliac joint pain (Left); Chronic low back pain (Left) w/o sciatica; Spondylosis without myelopathy or radiculopathy, lumbosacral region; 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 Pain Assessment: Severity of Chronic pain is reported as a 3 /10. Location: Back Lower,Left/no longer going down the left hip and leg. Onset: More than a month ago. Quality: Discomfort,Constant. Timing: Constant. Modifying factor(s): procedure. Vitals:  height is '5\' 6"'  (1.676 m) and weight is 222 lb (100.7 kg). His temporal temperature is 97 F (36.1 C) (abnormal). His blood pressure is 112/51 (abnormal) and his pulse is 58 (abnormal). His respiration is 16 and oxygen saturation is 99%.   Reason for encounter: post-procedure assessment.  According to the patient, he attained 100% relief of the pain for the initial 4 hours after the procedure.  He indicates that while he was numb he was not having any pain.  He refers having gone home and having taken a nap of approximately 4 hours after which the numbness was gone.  Once the numbness wore off, he was uncomfortable until the next day when he woke up with 75% relief of his low back pain.  He refers that after that, he was able to go out and work on his yard.  Currently he describes having still an ongoing 75% improvement of his pain.  Today we talked about several options including repeating the procedure and he indicated that he will give [place] a call when the pain returns so that we can do that.  For now, he wants to hold.  Post-Procedure Evaluation  Procedure (06/14/2020): Diagnostic left lumbar facet block #1 under fluoroscopic guidance and IV sedation Pre-procedure pain level: 3/10 Post-procedure: 0/10 (100% relief)  Sedation: Sedation provided.  Effectiveness during initial hour after procedure(Ultra-Short Term Relief): 100 %.  Local anesthetic used: Long-acting (4-6 hours) Effectiveness: Defined as any analgesic benefit obtained secondary to the  administration of local anesthetics. This carries significant diagnostic value as to the etiological location, or anatomical origin, of the pain. Duration of benefit is expected to coincide with the duration of the local anesthetic used.  Effectiveness during initial 4-6 hours after procedure(Short-Term Relief): 100 %. X 4 hrs.  Long-term benefit: Defined as any relief past the pharmacologic duration of the local anesthetics.  Effectiveness past the initial 6 hours after procedure(Long-Term Relief): 75 % (reports that he still has improvement in back pain.).  Current benefits: Defined as benefit that persist at this time.   Analgesia:  Ongoing 75% relief of the low back pain. Function: Darin Waters reports improvement in function ROM: Darin Waters reports improvement in ROM  Pharmacotherapy Assessment   Analgesic: Hydrocodone/APAP 10/325 1 tablet p.o. twice daily (20 mg/day of hydrocodone) (20 MME/day)  MME/day: 20 mg/day   Monitoring: [place] PMP: PDMP reviewed during this encounter.       Pharmacotherapy: No side-effects or adverse reactions reported. Compliance: No problems identified. Effectiveness: Clinically acceptable.  Janett Billow, RN  06/28/2020  2:25 PM  Sign when Signing Visit Safety precautions to be maintained throughout the outpatient stay will include: orient to surroundings, keep bed in low position, maintain call Waters within reach at all times, provide assistance with transfer out of bed and ambulation.     UDS:  Summary  Date Value Ref Range Status  10/10/2019 Note  Final    Comment:    ==================================================================== Compliance Drug Analysis, Ur ==================================================================== Test                             Result       Flag       Units  Drug Present and Declared for Prescription Verification   Gabapentin                     PRESENT      EXPECTED   Citalopram                      PRESENT      EXPECTED   Desmethylcitalopram            PRESENT      EXPECTED    Desmethylcitalopram is an expected metabolite of citalopram or the    enantiomeric form, escitalopram.    Acetaminophen                  PRESENT      EXPECTED  Drug Present not Declared for Prescription Verification   Hydrocodone                    2963         UNEXPECTED ng/mg creat   Hydromorphone                  135          UNEXPECTED ng/mg creat   Dihydrocodeine                 393          UNEXPECTED ng/mg creat   Norhydrocodone                 1000         UNEXPECTED ng/mg creat    Sources of hydrocodone include scheduled prescription medications.    Hydromorphone, dihydrocodeine and norhydrocodone are expected    metabolites of hydrocodone.  Hydromorphone and dihydrocodeine are    also available as scheduled prescription medications.    Butorphanol                    PRESENT      UNEXPECTED  Drug Absent but Declared for Prescription Verification   Oxycodone                      Not Detected UNEXPECTED ng/mg creat   Cyclobenzaprine                Not Detected UNEXPECTED   Salicylate                     Not Detected UNEXPECTED    Aspirin, as indicated in the declared medication list, is not always    detected even when used as directed.  ==================================================================== Test                      Result    Flag   Units      Ref Range   Creatinine              185              mg/dL      >=20 ==================================================================== Declared Medications:  The flagging and interpretation on this report are based on the  following declared medications.  Unexpected results may arise from  inaccuracies in the declared medications.   **Note: The testing scope of this panel includes these medications:   Citalopram (Celexa)  Cyclobenzaprine (Flexeril)  Gabapentin (Neurontin)  Oxycodone (Percocet)   **Note: The testing scope of this  panel does not include small to  moderate amounts of these reported medications:   Acetaminophen (Percocet)  Aspirin   **Note: The testing scope of this panel does not include the  following reported medications:   Albuterol (Proair HFA)  Amlodipine (Lotrel)  Benazepril (Lotrel)  Budesonide (Symbicort)  Docusate (Colace)  Esomeprazole (Nexium)  Fluticasone (Trelegy)  Formoterol (Symbicort)  Furosemide (Lasix)  Glimepiride (Amaryl)  Insulin  Metformin (Glucophage)  Multivitamin  Quinine (Qualaquin)  Simvastatin (Zocor)  Umeclidinium (Trelegy)  Vilanterol (Trelegy) ==================================================================== For clinical consultation, please call [phone]. ====================================================================      ROS  Constitutional: Denies any fever or chills Gastrointestinal: No reported hemesis, hematochezia, vomiting, or acute GI distress Musculoskeletal: Denies any acute onset joint swelling, redness, loss of ROM, or weakness Neurological: No reported episodes of acute onset apraxia, aphasia, dysarthria, agnosia, amnesia, paralysis, loss of coordination, or loss of consciousness  Medication Review  Fluticasone-Umeclidin-Vilant, HYDROcodone-acetaminophen, Semaglutide(0.25 or 0.5MG/DOS), albuterol, amLODipine-benazepril, aspirin EC, atorvastatin, citalopram, clopidogrel, esomeprazole, fluticasone, furosemide, gabapentin, glimepiride, glucose blood, insulin glargine, metFORMIN, multivitamin with minerals, nabumetone, quiNINE, and spironolactone  History Review  Allergy: Darin Waters has No Known Allergies. Drug: Darin Waters  reports no history of drug use. Alcohol:  reports current alcohol use of about 3.0 standard drinks of alcohol per week. Tobacco:  reports that he has been smoking e-cigarettes. He has never used smokeless tobacco. Social: Darin Waters  reports that he has been smoking e-cigarettes. He has never used  smokeless tobacco. He reports current alcohol use of about 3.0 standard drinks of alcohol per week. He reports that he does not use drugs. Medical:  has a past medical history of Arthritis, COPD (chronic obstructive pulmonary disease) ([place]), Depression, Diabetes mellitus without complication ([place]), GERD (gastroesophageal reflux disease), History of kidney stones (1990's ), Hypertension, and  Sleep apnea (01/30/2016). Surgical: Darin Waters  has a past surgical history that includes Colonoscopy; Rectal polypectomy (4-5 yrs ago); Anterior cervical decomp/discectomy fusion (N/A, 07/18/2015); Esophagogastroduodenoscopy (egd) with propofol (N/A, 08/03/2019); Colonoscopy with propofol (N/A, 08/03/2019); Lower Extremity Angiography (Left, 02/07/2020); and leg stint. Family: family history includes Asthma in his mother; Dementia in his father; Diabetes in his mother; Heart Problems in his father; Hyperlipidemia in his father and mother; Hypertension in his father and mother.  Laboratory Chemistry Profile   Renal Lab Results  Component Value Date   BUN 21 02/07/2020   CREATININE 1.23 02/07/2020   BCR 14 10/10/2019   GFRAA 61 10/10/2019   GFRNONAA >60 02/07/2020     Hepatic Lab Results  Component Value Date   AST 30 10/10/2019   ALBUMIN 4.6 10/10/2019   ALKPHOS 59 10/10/2019     Electrolytes Lab Results  Component Value Date   NA 136 10/10/2019   K 5.7 (H) 10/10/2019   CL 100 10/10/2019   CALCIUM 9.8 10/10/2019   MG 2.0 10/10/2019     Bone Lab Results  Component Value Date   25OHVITD1 33 10/10/2019   25OHVITD2 <1.0 10/10/2019   25OHVITD3 33 10/10/2019     Inflammation (CRP: Acute Phase) (ESR: Chronic Phase) Lab Results  Component Value Date   CRP 2 10/10/2019   ESRSEDRATE 16 10/10/2019       Note: Above Lab results reviewed.  Recent Imaging Review  DG PAIN CLINIC C-ARM 1-60 MIN NO REPORT Fluoro was used, but no Radiologist interpretation will be provided.  Please refer to "NOTES"  tab for provider progress note. Note: Reviewed        Physical Exam  General appearance: Well nourished, well developed, and well hydrated. In no apparent acute distress Mental status: Alert, oriented x 3 (person, place, & time)       Respiratory: No evidence of acute respiratory distress Eyes: PERLA Vitals: BP (!) 112/51 (BP Location: Right Arm, Patient Position: Sitting, Cuff Size: Normal)   Pulse (!) 58   Temp (!) 97 F (36.1 C) (Temporal)   Resp 16   Ht '5\' 6"'  (1.676 m)   Wt 222 lb (100.7 kg)   SpO2 99%   BMI 35.83 kg/m  BMI: Estimated body mass index is 35.83 kg/m as calculated from the following:   Height as of this encounter: '5\' 6"'  (1.676 m).   Weight as of this encounter: 222 lb (100.7 kg). Ideal: Ideal body weight: 63.8 kg (140 lb 10.5 oz) Adjusted ideal body weight: 78.6 kg (173 lb 3.1 oz)  Assessment   Status Diagnosis  Controlled Improved Stable 1. Lumbar facet syndrome (Left)   2. Chronic low back pain (Left) w/o sciatica   3. DDD (degenerative disc disease), lumbar   4. Chronic pain syndrome      Updated Problems: No problems updated.  Plan of Care  Problem-specific:  No problem-specific Assessment & Plan notes found for this encounter.  Darin Waters has a current medication list which includes the following long-term medication(s): amlodipine-benazepril, atorvastatin, citalopram, esomeprazole, fluticasone, furosemide, gabapentin, glimepiride, insulin glargine, metformin, proair hfa, and spironolactone.  Pharmacotherapy (Medications Ordered): No orders of the defined types were placed in this encounter.  Orders:  Orders Placed This Encounter  Procedures  . LUMBAR FACET(MEDIAL BRANCH NERVE BLOCK) MBNB    Standing Status:   Standing    Number of Occurrences:   1    Standing Expiration Date:   12/28/2020    Scheduling Instructions:  Procedure: Lumbar facet block (AKA.: Lumbosacral medial branch nerve block)     Side: Left-sided     Level:  L3-4, L4-5, & L5-S1 Facets (L2, L3, L4, L5, & S1 Medial Branch Nerves)     Sedation: With Sedation.     Timeframe: PRN    Order Specific Question:   Where will this procedure be performed?    Answer:   [facility]   Follow-up plan:   Return for PRN Procedure (w/ sedation): (L) L-FCT BLK #2.      Interventional Therapies  Risk  Complexity Considerations:   NOTE: Plavix Anticoagulation (Stop: 7-10 days  Restart: 2 hours) Prior patient of Dr. Sharlet Salina  Uncontrolled type 2 diabetes with hyperglycemia  Nicotine dependence  tobacco abuse  COPD  SOB on exertion  CAD  hypertension   Planned  Pending:   Diagnostic left lumbar facet block #1    Under consideration:   Diagnostic left lumbar facet block #2  Diagnostic left SI joint block #1  Therapeutic Racz procedure #1  Diagnostic bilateral lumbar spinal cord stimulator trial    Completed:   Diagnostic left lumbar facet block x1 (06/14/2020) (100/100/75/75)  Diagnostic left caudal ESI + epidurogram x2 (01/10/2020) (100/100/1 100 x 2 weeks/50) (90/90/90/100)   Therapeutic  Palliative (PRN) options:   Therapeutic (ML) caudal ESI #3  Diagnostic/therapeutic left lumbar facet block #2     Recent Visits Date Type Provider Dept  06/14/20 Procedure visit Milinda Pointer, MD [facility]  05/28/20 Telemedicine Milinda Pointer, MD [facility]  Showing recent visits within past 90 days and meeting all other requirements Today's Visits Date Type Provider Dept  06/28/20 Office Visit Milinda Pointer, MD [facility]  Showing today's visits and meeting all other requirements Future Appointments No visits were found meeting these conditions. Showing future appointments within next 90 days and meeting all other requirements  I discussed the assessment and treatment plan with the patient. The patient was provided an opportunity to ask questions and all were answered. The patient agreed with the  plan and demonstrated an understanding of the instructions.  Patient advised to call back or seek an in-person evaluation if the symptoms or condition worsens.  Duration of encounter: 30 minutes.  Note by: Gaspar Cola, MD Date: 06/28/2020; Time: 3:06 PM

## 2020-06-28 ENCOUNTER — Other Ambulatory Visit: Payer: Self-pay

## 2020-06-28 ENCOUNTER — Ambulatory Visit: Payer: Medicare HMO | Attending: Pain Medicine | Admitting: Pain Medicine

## 2020-06-28 ENCOUNTER — Encounter: Payer: Self-pay | Admitting: Pain Medicine

## 2020-06-28 VITALS — BP 112/51 | HR 58 | Temp 97.0°F | Resp 16 | Ht 66.0 in | Wt 222.0 lb

## 2020-06-28 DIAGNOSIS — G894 Chronic pain syndrome: Secondary | ICD-10-CM | POA: Diagnosis not present

## 2020-06-28 DIAGNOSIS — G8929 Other chronic pain: Secondary | ICD-10-CM

## 2020-06-28 DIAGNOSIS — M47816 Spondylosis without myelopathy or radiculopathy, lumbar region: Secondary | ICD-10-CM

## 2020-06-28 DIAGNOSIS — M545 Low back pain, unspecified: Secondary | ICD-10-CM

## 2020-06-28 DIAGNOSIS — M5136 Other intervertebral disc degeneration, lumbar region: Secondary | ICD-10-CM

## 2020-06-28 NOTE — Patient Instructions (Addendum)
___Stop plavix for 7 days prior to procedure  _________________________________________________________________________________________  Preparing for Procedure with Sedation  Procedure appointments are limited to planned procedures: . No Prescription Refills. . No disability issues will be discussed. . No medication changes will be discussed.  Instructions: . Oral Intake: Do not eat or drink anything for at least 8 hours prior to your procedure. (Exception: Blood Pressure Medication. See below.) . Transportation: Unless otherwise stated by your physician, you may drive yourself after the procedure. . Blood Pressure Medicine: Do not forget to take your blood pressure medicine with a sip of water the morning of the procedure. If your Diastolic (lower reading)is above 100 mmHg, elective cases will be cancelled/rescheduled. . Blood thinners: These will need to be stopped for procedures. Notify our staff if you are taking any blood thinners. Depending on which one you take, there will be specific instructions on how and when to stop it. . Diabetics on insulin: Notify the staff so that you can be scheduled 1st case in the morning. If your diabetes requires high dose insulin, take only  of your normal insulin dose the morning of the procedure and notify the staff that you have done so. . Preventing infections: Shower with an antibacterial soap the morning of your procedure. . Build-up your immune system: Take 1000 mg of Vitamin C with every meal (3 times a day) the day prior to your procedure. Marland Kitchen Antibiotics: Inform the staff if you have a condition or reason that requires you to take antibiotics before dental procedures. . Pregnancy: If you are pregnant, call and cancel the procedure. . Sickness: If you have a cold, fever, or any active infections, call and cancel the procedure. . Arrival: You must be in the facility at least 30 minutes prior to your scheduled procedure. . Children: Do not bring  children with you. . Dress appropriately: Bring dark clothing that you would not mind if they get stained. . Valuables: Do not bring any jewelry or valuables.  Reasons to call and reschedule or cancel your procedure: (Following these recommendations will minimize the risk of a serious complication.) . Surgeries: Avoid having procedures within 2 weeks of any surgery. (Avoid for 2 weeks before or after any surgery). . Flu Shots: Avoid having procedures within 2 weeks of a flu shots or . (Avoid for 2 weeks before or after immunizations). . Barium: Avoid having a procedure within 7-10 days after having had a radiological study involving the use of radiological contrast. (Myelograms, Barium swallow or enema study). . Heart attacks: Avoid any elective procedures or surgeries for the initial 6 months after a "Myocardial Infarction" (Heart Attack). . Blood thinners: It is imperative that you stop these medications before procedures. Let us know if you if you take any blood thinner.  . Infection: Avoid procedures during or within two weeks of an infection (including chest colds or gastrointestinal problems). Symptoms associated with infections include: Localized redness, fever, chills, night sweats or profuse sweating, burning sensation when voiding, cough, congestion, stuffiness, runny nose, sore throat, diarrhea, nausea, vomiting, cold or Flu symptoms, recent or current infections. It is specially important if the infection is over the area that we intend to treat. Marland Kitchen Heart and lung problems: Symptoms that may suggest an active cardiopulmonary problem include: cough, chest pain, breathing difficulties or shortness of breath, dizziness, ankle swelling, uncontrolled high or unusually low blood pressure, and/or palpitations. If you are experiencing any of these symptoms, cancel your procedure and contact your primary care physician for  an evaluation.  Remember:  Regular Business hours are:  Monday to Thursday  8:00 AM to 4:00 PM  Provider's Schedule: Darin Metz, MD:  Procedure days: Tuesday and Thursday 7:30 AM to 4:00 PM  Darin Jolly, MD:  Procedure days: Monday and Wednesday 7:30 AM to 4:00 PM ____________________________________________________________________________________________   Preparing for Procedure with Sedation Instructions: . Oral Intake: Do not eat or drink anything for at least 8 hours prior to your procedure. . Transportation: Public transportation is not allowed. Bring an adult driver. The driver must be physically present in our waiting room before any procedure can be started. Marland Kitchen Physical Assistance: Bring an adult capable of physically assisting you, in the event you need help. . Blood Pressure Medicine: Take your blood pressure medicine with a sip of water the morning of the procedure. . Insulin: Take only  of your normal insulin dose. . Preventing infections: Shower with an antibacterial soap the morning of your procedure. . Build-up your immune system: Take 1000 mg of Vitamin C with every meal (3 times a day) the day prior to your procedure. . Pregnancy: If you are pregnant, call and cancel the procedure. . Sickness: If you have a cold, fever, or any active infections, call and cancel the procedure. . Arrival: You must be in the facility at least 30 minutes prior to your scheduled procedure. . Children: Do not bring children with you. . Dress appropriately: Bring dark clothing that you would not mind if they get stained. . Valuables: Do not bring any jewelry or valuables. Procedure appointments are reserved for interventional treatments only. Marland Kitchen No Prescription Refills. . No medication changes will be discussed during procedure appointments. . No disability issues will be discussed. Marland Kitchen

## 2020-06-28 NOTE — Progress Notes (Signed)
Safety precautions to be maintained throughout the outpatient stay will include: orient to surroundings, keep bed in low position, maintain call bell within reach at all times, provide assistance with transfer out of bed and ambulation.  

## 2020-07-24 ENCOUNTER — Other Ambulatory Visit (INDEPENDENT_AMBULATORY_CARE_PROVIDER_SITE_OTHER): Payer: Self-pay | Admitting: Vascular Surgery

## 2020-08-29 ENCOUNTER — Telehealth: Payer: Self-pay

## 2020-08-29 NOTE — Telephone Encounter (Signed)
LMOM for updated insurance info ID number

## 2020-09-06 ENCOUNTER — Encounter (INDEPENDENT_AMBULATORY_CARE_PROVIDER_SITE_OTHER): Payer: Self-pay | Admitting: Vascular Surgery

## 2020-09-06 ENCOUNTER — Other Ambulatory Visit: Payer: Self-pay

## 2020-09-06 ENCOUNTER — Ambulatory Visit (INDEPENDENT_AMBULATORY_CARE_PROVIDER_SITE_OTHER): Payer: Medicare HMO | Admitting: Vascular Surgery

## 2020-09-06 ENCOUNTER — Ambulatory Visit (INDEPENDENT_AMBULATORY_CARE_PROVIDER_SITE_OTHER): Payer: Medicare HMO

## 2020-09-06 VITALS — BP 94/85 | HR 65 | Ht 66.0 in | Wt 215.0 lb

## 2020-09-06 DIAGNOSIS — J449 Chronic obstructive pulmonary disease, unspecified: Secondary | ICD-10-CM | POA: Diagnosis not present

## 2020-09-06 DIAGNOSIS — I70219 Atherosclerosis of native arteries of extremities with intermittent claudication, unspecified extremity: Secondary | ICD-10-CM | POA: Insufficient documentation

## 2020-09-06 DIAGNOSIS — I10 Essential (primary) hypertension: Secondary | ICD-10-CM

## 2020-09-06 DIAGNOSIS — I739 Peripheral vascular disease, unspecified: Secondary | ICD-10-CM | POA: Diagnosis not present

## 2020-09-06 DIAGNOSIS — I25118 Atherosclerotic heart disease of native coronary artery with other forms of angina pectoris: Secondary | ICD-10-CM

## 2020-09-06 DIAGNOSIS — I70213 Atherosclerosis of native arteries of extremities with intermittent claudication, bilateral legs: Secondary | ICD-10-CM | POA: Diagnosis not present

## 2020-09-06 DIAGNOSIS — E1165 Type 2 diabetes mellitus with hyperglycemia: Secondary | ICD-10-CM

## 2020-09-06 NOTE — Progress Notes (Signed)
MRN : [id number]  Darin Waters is a 60 y.o. (1960-05-03) male who presents with chief complaint of  Chief Complaint  Patient presents with   Follow-up    Follow aorta illiac U/S  .  History of Present Illness:   The patient returns to the office for followup and review status post angiogram with intervention on 02/07/2020.    Procedure:  Percutaneous transluminal angioplasty and stent placement left common iliac artery with a 12 x 40 life star stent   The patient notes his walking remains stable , no decrease in his walking distance or increase in leg pain.  No interval shortening of the patient's claudication distance or rest pain symptoms. Previous wounds have now healed.  No new ulcers or wounds have occurred since the last visit.   There have been no significant changes to the patient's overall health care.   The patient denies amaurosis fugax or recent TIA symptoms. There are no recent neurological changes noted. The patient denies history of DVT, PE or superficial thrombophlebitis. The patient denies recent episodes of angina or shortness of breath.   ABI's Rt=1.25 and Lt=1.19  (previous ABI's Rt=1.16 and Lt=1.16) Duplex US of the aorto iliac arterial system shows widely patent iliac stent  Current Meds  Medication Sig   amLODipine-benazepril (LOTREL) 10-40 MG capsule Take 1 capsule by mouth daily before breakfast.    aspirin EC 81 MG tablet Take 1 tablet (81 mg total) by mouth daily. Swallow whole.   atorvastatin (LIPITOR) 40 MG tablet Take 40 mg by mouth daily.    citalopram (CELEXA) 10 MG tablet Take 10 mg by mouth daily before breakfast.    esomeprazole (NEXIUM) 20 MG capsule Take 20 mg by mouth daily before breakfast.   fluticasone (FLONASE) 50 MCG/ACT nasal spray Place 2 sprays into both nostrils daily.   Fluticasone-Umeclidin-Vilant (TRELEGY ELLIPTA) 100-62.5-25 MCG/INH AEPB Inhale 1 puff into the lungs daily. (Patient taking differently: Inhale 1 puff into the  lungs daily as needed (COPD).)   furosemide (LASIX) 20 MG tablet Take 20 mg by mouth daily.   gabapentin (NEURONTIN) 300 MG capsule Take 300 mg by mouth 2 (two) times daily.    glimepiride (AMARYL) 2 MG tablet Take 2 mg by mouth in the morning and at bedtime.    HYDROcodone-acetaminophen (NORCO) 10-325 MG tablet Take 2 tablets by mouth in the morning and at bedtime.    insulin glargine (LANTUS) 100 UNIT/ML Solostar Pen Inject 65 Units into the skin at bedtime.    metFORMIN (GLUCOPHAGE) 500 MG tablet Take 1,000 mg by mouth 2 (two) times daily with a meal.    Multiple Vitamins-Minerals (MULTIVITAMIN WITH MINERALS) tablet Take 1 tablet by mouth daily.   nabumetone (RELAFEN) 750 MG tablet Take 750 mg by mouth 2 (two) times daily.   nabumetone (RELAFEN) 750 MG tablet Take 2 tablets by mouth daily.   ONETOUCH VERIO test strip SMARTSIG:Via Meter   PROAIR HFA 108 (90 Base) MCG/ACT inhaler Take 2 puffs by mouth every 6 (six) hours as needed for wheezing or shortness of breath.    quiNINE (QUALAQUIN) 324 MG capsule Take 1 capsule by mouth daily.    Semaglutide,0.25 or 0.5MG /DOS, (OZEMPIC, 0.25 OR 0.5 MG/DOSE,) 2 MG/1.5ML SOPN Inject into the skin.   spironolactone (ALDACTONE) 50 MG tablet Take 50 mg by mouth daily.   [DISCONTINUED] clopidogrel (PLAVIX) 75 MG tablet TAKE 1 TABLET BY MOUTH DAILY    Past Medical History:  Diagnosis Date   Arthritis  DDD- lumbar , thumbs - both hands    COPD (chronic obstructive pulmonary disease) (HCC)    Depression    Diabetes mellitus without complication (HCC)    GERD (gastroesophageal reflux disease)    History of kidney stones 1990's    lithotripsy done    Hypertension    Sleep apnea 01/30/2016   [facility] [place], results not avail to pt. yet    Past Surgical History:  Procedure Laterality Date   ANTERIOR CERVICAL DECOMP/DISCECTOMY FUSION N/A 07/18/2015   Procedure: CERVICALFIVE-SIX, CERVICAL SIX-SEVEN ANTERIOR CERVICAL  DECOMPRESSION/DISCECTOMY FUSION;  Surgeon: Tressie StalkerJeffrey Jenkins, MD;  Location: [facility];  Service: Neurosurgery;  Laterality: N/A;  C56 C67 anterior cervical decompression with fusion interbody prosthesis plating and bonegraft   COLONOSCOPY     COLONOSCOPY WITH PROPOFOL N/A 08/03/2019   Procedure: COLONOSCOPY WITH PROPOFOL;  Surgeon: Toledo, Boykin Nearingeodoro K, MD;  Location: [facility];  Service: Gastroenterology;  Laterality: N/A;   ESOPHAGOGASTRODUODENOSCOPY (EGD) WITH PROPOFOL N/A 08/03/2019   Procedure: ESOPHAGOGASTRODUODENOSCOPY (EGD) WITH PROPOFOL;  Surgeon: Toledo, Boykin Nearingeodoro K, MD;  Location: [facility];  Service: Gastroenterology;  Laterality: N/A;   leg stint     nov 2021   LOWER EXTREMITY ANGIOGRAPHY Left 02/07/2020   Procedure: LOWER EXTREMITY ANGIOGRAPHY;  Surgeon: Renford DillsSchnier, Shayne Diguglielmo G, MD;  Location: [facility];  Service: Cardiovascular;  Laterality: Left;   RECTAL POLYPECTOMY  4-5 yrs ago    Social History Social History   Tobacco Use   Smoking status: Every Day    Pack years: 0.00    Types: E-cigarettes    Last attempt to quit: 05/2016    Years since quitting: 4.3   Smokeless tobacco: Never   Tobacco comments:    pt in the middle of wanting to stop  Vaping Use   Vaping Use: Every day   Substances: Nicotine  Substance Use Topics   Alcohol use: Yes    Alcohol/week: 3.0 standard drinks    Types: 3 Cans of beer per week    Comment: daily    Drug use: No    Family History Family History  Problem Relation Age of Onset   Diabetes Mother    Asthma Mother    Hypertension Mother    Hyperlipidemia Mother    Hyperlipidemia Father    Dementia Father    Hypertension Father    Heart Problems Father        pace maker    No Known Allergies   REVIEW OF SYSTEMS (Negative unless checked)  Constitutional: [] Weight loss  [] Fever  [] Chills Cardiac: [] Chest pain   [] Chest pressure   [] Palpitations   [] Shortness of breath when laying flat   [] Shortness of breath with  exertion. Vascular:  [] Pain in legs with walking   [] Pain in legs at rest  [] History of DVT   [] Phlebitis   [] Swelling in legs   [] Varicose veins   [] Non-healing ulcers Pulmonary:   [] Uses home oxygen   [] Productive cough   [] Hemoptysis   [] Wheeze  [x] COPD   [] Asthma Neurologic:  [] Dizziness   [] Seizures   [] History of stroke   [] History of TIA  [] Aphasia   [] Vissual changes   [] Weakness or numbness in arm   [] Weakness or numbness in leg Musculoskeletal:   [] Joint swelling   [] Joint pain   [] Low back pain Hematologic:  [] Easy bruising  [] Easy bleeding   [] Hypercoagulable state   [] Anemic Gastrointestinal:  [] Diarrhea   [] Vomiting  [x] Gastroesophageal reflux/heartburn   [] Difficulty swallowing. Genitourinary:  []   Chronic kidney disease   [] Difficult urination  [] Frequent urination   [] Blood in urine Skin:  [] Rashes   [] Ulcers  Psychological:  [] History of anxiety   []  History of major depression.  Physical Examination  Vitals:   09/06/20 1057  BP: 94/85  Pulse: 65  Weight: 215 lb (97.5 kg)  Height: 5\' 6"  (1.676 m)   Body mass index is 34.7 kg/m. Gen: WD/WN, NAD Head: [place]/AT, No temporalis wasting.  Ear/Nose/Throat: Hearing grossly intact, nares w/o erythema or drainage Eyes: PER, EOMI, sclera nonicteric.  Neck: Supple, no large masses.   Pulmonary:  Good air movement, no audible wheezing bilaterally, no use of accessory muscles.  Cardiac: RRR, no JVD Vascular:  Vessel Right Left  Radial Palpable Palpable  PT Palpable Palpable  DP Trace Palpable Palpable  Gastrointestinal: Non-distended. No guarding/no peritoneal signs.  Musculoskeletal: M/S 5/5 throughout.  No deformity or atrophy.  Neurologic: CN 2-12 intact. Symmetrical.  Speech is fluent. Motor exam as listed above. Psychiatric: Judgment intact, Mood & affect appropriate for pt's clinical situation. Dermatologic: No rashes or ulcers noted.  No changes consistent with cellulitis.   CBC Lab Results  Component Value Date    WBC 20.2 (H) 02/14/2016   HGB 14.0 02/14/2016   HCT 40.8 02/14/2016   MCV 96.7 02/14/2016   PLT 219 02/14/2016    BMET    Component Value Date/Time   NA 136 10/10/2019 1642   NA 140 04/11/2011 0738   K 5.7 (H) 10/10/2019 1642   K 3.7 04/11/2011 0738   CL 100 10/10/2019 1642   CL 105 04/11/2011 0738   CO2 26 02/14/2016 0220   CO2 28 04/11/2011 0738   GLUCOSE 165 (H) 10/10/2019 1642   GLUCOSE 212 (H) 02/14/2016 0220   GLUCOSE 155 (H) 04/11/2011 0738   BUN 21 02/07/2020 0730   BUN 20 10/10/2019 1642   BUN 14 04/11/2011 0738   CREATININE 1.23 02/07/2020 0730   CREATININE 0.85 04/11/2011 0738   CALCIUM 9.8 10/10/2019 1642   CALCIUM 9.3 04/11/2011 0738   GFRNONAA >60 02/07/2020 0730   GFRNONAA >60 04/11/2011 0738   GFRAA 61 10/10/2019 1642   GFRAA >60 04/11/2011 0738   CrCl cannot be calculated (Patient's most recent lab result is older than the maximum 21 days allowed.).  COAG No results found for: INR, PROTIME  Radiology No results found.    Assessment/Plan 1. Atherosclerosis of native artery of both lower extremities with intermittent claudication (HCC) Recommend:   The patient is status post successful angiogram with intervention.  The patient reports that the claudication symptoms and leg pain is essentially gone.   The patient denies lifestyle limiting changes at this point in time.   No further invasive studies, angiography or surgery at this time The patient should continue walking and begin a more formal exercise program. The patient should continue antiplatelet therapy and aggressive treatment of the lipid abnormalities   The patient should continue wearing graduated compression socks 10-15 mmHg strength to control the mild edema.   Patient should undergo noninvasive studies as ordered. The patient will follow up with me after the studies.  - VAS 13/11/2019 ABI WITH/WO TBI; Future  2. Hypertension, unspecified type Continue antihypertensive medications as  already ordered, these medications have been reviewed and there are no changes at this time.   3. Coronary artery disease of native artery of native heart with stable angina pectoris (HCC) Continue cardiac and antihypertensive medications as already ordered and reviewed, no changes at this  time.  Continue statin as ordered and reviewed, no changes at this time  Nitrates PRN for chest pain   4. Chronic obstructive pulmonary disease, unspecified COPD type (HCC) Continue pulmonary medications and aerosols as already ordered, these medications have been reviewed and there are no changes at this time.    5. Uncontrolled type 2 diabetes mellitus with hyperglycemia (HCC) Continue hypoglycemic medications as already ordered, these medications have been reviewed and there are no changes at this time.  Hgb A1C to be monitored as already arranged by primary service     Levora Dredge, MD  09/06/2020 11:30 AM

## 2020-09-09 ENCOUNTER — Encounter (INDEPENDENT_AMBULATORY_CARE_PROVIDER_SITE_OTHER): Payer: Self-pay | Admitting: Vascular Surgery

## 2020-09-12 ENCOUNTER — Other Ambulatory Visit: Payer: Self-pay

## 2020-09-12 ENCOUNTER — Ambulatory Visit: Payer: Medicare HMO | Admitting: Internal Medicine

## 2020-09-12 DIAGNOSIS — R0602 Shortness of breath: Secondary | ICD-10-CM | POA: Diagnosis not present

## 2020-09-12 LAB — PULMONARY FUNCTION TEST

## 2020-09-16 NOTE — Procedures (Signed)
[facility] [address] [place], [address]    Complete Pulmonary Function Testing Interpretation:  FINDINGS:  Forced vital capacity is normal the FEV1 is normal F1 FVC ratio was mildly decreased.  Total lung capacity is mildly decreased.  Residual volume is decreased residual internal capacity ratio was decreased.  FRC was decreased.  DLCO was within normal limits.  Postbronchodilator disease does not appear to be significant response.  IMPRESSION:  This pulmonary function study is suggestive of mild restrictive lung disease clinical correlation is recommended  Yevonne Pax, MD [facility] Critical [facility] Medicine Sleep Medicine

## 2020-10-15 NOTE — Progress Notes (Signed)
PROVIDER NOTE: Information contained herein reflects review and annotations entered in association with encounter. Interpretation of such information and data should be left to medically-trained personnel. Information provided to patient can be located elsewhere in the medical record under "Patient Instructions". Document created using STT-dictation technology, any transcriptional errors that may result from process are unintentional.    Patient: Darin Waters  Service Category: Procedure  Provider: Oswaldo Done, MD  DOB: [birth year]  DOS: 10/16/2020  Location: [facility]  MRN: [id number]  Setting: Ambulatory - outpatient  Referring Provider: Delano Metz, MD  Type: Established Patient  Specialty: Interventional Pain Management  PCP: Myrene Buddy, NP   Primary Reason for Visit: Interventional Pain Management Treatment. CC: Back Pain (lower)  Procedure:          Anesthesia, Analgesia, Anxiolysis:  Type: Lumbar Facet, Medial Branch Block(s)          Primary Purpose: Diagnostic Region: Posterolateral Lumbosacral Spine Level: L2, L3, L4, L5, & S1 Medial Branch Level(s). Injecting these levels blocks the L3-4, L4-5, and L5-S1 lumbar facet joints. Laterality: Left  Type: Minimal (Conscious) Anxiolysis combined with Local Anesthesia Indication(s): Analgesia and Anxiety Route: Oral (PO) IV Access: Secured Sedation: Meaningful verbal contact was maintained at all times during the procedure  Local Anesthetic: Lidocaine 1-2%  Position: Prone   Indications: 1. Lumbar facet syndrome (Left)   2. DDD (degenerative disc disease), lumbar   3. Spondylosis without myelopathy or radiculopathy, lumbosacral region   4. 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 Spondylolisthesis of lumbar region   6. Chronic anticoagulation (Plavix)    Pain Score: Pre-procedure: 6 /10 Post-procedure: 2 /10   Pre-op H&P Assessment:  Mr. Pasquarello is  a 60 y.o. (year old), male patient, seen today for interventional treatment. He  has a past surgical history that includes Colonoscopy; Rectal polypectomy (4-5 yrs ago); Anterior cervical decomp/discectomy fusion (N/A, 07/18/2015); Esophagogastroduodenoscopy (egd) with propofol (N/A, 08/03/2019); Colonoscopy with propofol (N/A, 08/03/2019); Lower Extremity Angiography (Left, 02/07/2020); and leg stint. Mr. Cumbie has a current medication list which includes the following prescription(s): amlodipine-benazepril, aspirin ec, atorvastatin, citalopram, esomeprazole, fluticasone, trelegy ellipta, furosemide, gabapentin, glimepiride, hydrocodone-acetaminophen, insulin glargine, metformin, multivitamin with minerals, nabumetone, nabumetone, onetouch verio, proair hfa, quinine, and spironolactone. His primarily concern today is the Back Pain (lower)  Initial Vital Signs:  Pulse/HCG Rate: (!) 56ECG Heart Rate: (!) 54 Temp: 98.1 F (36.7 C) Resp: 16 BP: 122/64 SpO2: 96 %  BMI: Estimated body mass index is 34.7 kg/m as calculated from the following:   Height as of this encounter:  (1.676 m).   Weight as of this encounter: 215 lb (97.5 kg).  Risk Assessment: Allergies: Reviewed. He has No Known Allergies.  Allergy Precautions: None required Coagulopathies: Reviewed. None identified.  Blood-thinner therapy: None at this time Active Infection(s): Reviewed. None identified. Mr. Arrona is afebrile  Site Confirmation: Mr. Marte was asked to confirm the procedure and laterality before marking the site Procedure checklist: Completed Consent: Before the procedure and under the influence of no sedative(s), amnesic(s), or anxiolytics, the patient was informed of the treatment options, risks and possible complications. To fulfill our ethical and legal obligations, as recommended by the American Medical Association's Code of Ethics, I have informed the patient of my clinical impression; the nature and purpose of  the treatment or procedure; the risks, benefits, and possible complications of the intervention; the alternatives, including doing nothing; the risk(s) and benefit(s) of the alternative  treatment(s) or procedure(s); and the risk(s) and benefit(s) of doing nothing. The patient was provided information about the general risks and possible complications associated with the procedure. These may include, but are not limited to: failure to achieve desired goals, infection, bleeding, organ or nerve damage, allergic reactions, paralysis, and death. In addition, the patient was informed of those risks and complications associated to Spine-related procedures, such as failure to decrease pain; infection (i.e.: Meningitis, epidural or intraspinal abscess); bleeding (i.e.: epidural hematoma, subarachnoid hemorrhage, or any other type of intraspinal or peri-dural bleeding); organ or nerve damage (i.e.: Any type of peripheral nerve, nerve root, or spinal cord injury) with subsequent damage to sensory, motor, and/or autonomic systems, resulting in permanent pain, numbness, and/or weakness of one or several areas of the body; allergic reactions; (i.e.: anaphylactic reaction); and/or death. Furthermore, the patient was informed of those risks and complications associated with the medications. These include, but are not limited to: allergic reactions (i.e.: anaphylactic or anaphylactoid reaction(s)); adrenal axis suppression; blood sugar elevation that in diabetics may result in ketoacidosis or comma; water retention that in patients with history of congestive heart failure may result in shortness of breath, pulmonary edema, and decompensation with resultant heart failure; weight gain; swelling or edema; medication-induced neural toxicity; particulate matter embolism and blood vessel occlusion with resultant organ, and/or nervous system infarction; and/or aseptic necrosis of one or more joints. Finally, the patient was informed  that Medicine is not an exact science; therefore, there is also the possibility of unforeseen or unpredictable risks and/or possible complications that may result in a catastrophic outcome. The patient indicated having understood very clearly. We have given the patient no guarantees and we have made no promises. Enough time was given to the patient to ask questions, all of which were answered to the patient's satisfaction. [name] has indicated that he wanted to continue with the procedure. Attestation: I, the ordering provider, attest that I have discussed with the patient the benefits, risks, side-effects, alternatives, likelihood of achieving goals, and potential problems during recovery for the procedure that I have provided informed consent. Date  Time: 10/16/2020  9:10 AM  Pre-Procedure Preparation:  Monitoring: As per clinic protocol. Respiration, ETCO2, SpO2, BP, heart rate and rhythm monitor placed and checked for adequate function Safety Precautions: Patient was assessed for positional comfort and pressure points before starting the procedure. Time-out: I initiated and conducted the "Time-out" before starting the procedure, as per protocol. The patient was asked to participate by confirming the accuracy of the "Time Out" information. Verification of the correct person, site, and procedure were performed and confirmed by me, the nursing staff, and the patient. "Time-out" conducted as per Joint Commission's Universal Protocol (UP.01.01.01). Time: 0945  Description of Procedure:          Laterality: Left Levels:  L2, L3, L4, L5, & S1 Medial Branch Level(s) Area Prepped: Posterior Lumbosacral Region DuraPrep (Iodine Povacrylex [0.7% available iodine] and Isopropyl Alcohol, 74% w/w) Safety Precautions: Aspiration looking for blood return was conducted prior to all injections. At no point did we inject any substances, as a needle was being advanced. Before injecting, the patient was told to  immediately notify me if he was experiencing any new onset of "ringing in the ears, or metallic taste in the mouth". No attempts were made at seeking any paresthesias. Safe injection practices and needle disposal techniques used. Medications properly checked for expiration dates. SDV (single dose vial) medications used. After the completion of the procedure, all disposable  equipment used was discarded in the proper designated medical waste containers. Local Anesthesia: Protocol guidelines were followed. The patient was positioned over the fluoroscopy table. The area was prepped in the usual manner. The time-out was completed. The target area was identified using fluoroscopy. A 12-in long, straight, sterile hemostat was used with fluoroscopic guidance to locate the targets for each level blocked. Once located, the skin was marked with an approved surgical skin marker. Once all sites were marked, the skin (epidermis, dermis, and hypodermis), as well as deeper tissues (fat, connective tissue and muscle) were infiltrated with a small amount of a short-acting local anesthetic, loaded on a 10cc syringe with a 25G, 1.5-in  Needle. An appropriate amount of time was allowed for local anesthetics to take effect before proceeding to the next step. Local Anesthetic: Lidocaine 2.0% The unused portion of the local anesthetic was discarded in the proper designated containers. Technical explanation of process:  L2 Medial Branch Nerve Block (MBB): The target area for the L2 medial branch is at the junction of the postero-lateral aspect of the superior articular process and the superior, posterior, and medial edge of the transverse process of L3. Under fluoroscopic guidance, a Quincke needle was inserted until contact was made with os over the superior postero-lateral aspect of the pedicular shadow (target area). After negative aspiration for blood, 0.5 mL of the nerve block solution was injected without difficulty or  complication. The needle was removed intact. L3 Medial Branch Nerve Block (MBB): The target area for the L3 medial branch is at the junction of the postero-lateral aspect of the superior articular process and the superior, posterior, and medial edge of the transverse process of L4. Under fluoroscopic guidance, a Quincke needle was inserted until contact was made with os over the superior postero-lateral aspect of the pedicular shadow (target area). After negative aspiration for blood, 0.5 mL of the nerve block solution was injected without difficulty or complication. The needle was removed intact. L4 Medial Branch Nerve Block (MBB): The target area for the L4 medial branch is at the junction of the postero-lateral aspect of the superior articular process and the superior, posterior, and medial edge of the transverse process of L5. Under fluoroscopic guidance, a Quincke needle was inserted until contact was made with os over the superior postero-lateral aspect of the pedicular shadow (target area). After negative aspiration for blood, 0.5 mL of the nerve block solution was injected without difficulty or complication. The needle was removed intact. L5 Medial Branch Nerve Block (MBB): The target area for the L5 medial branch is at the junction of the postero-lateral aspect of the superior articular process and the superior, posterior, and medial edge of the sacral ala. Under fluoroscopic guidance, a Quincke needle was inserted until contact was made with os over the superior postero-lateral aspect of the pedicular shadow (target area). After negative aspiration for blood, 0.5 mL of the nerve block solution was injected without difficulty or complication. The needle was removed intact. S1 Medial Branch Nerve Block (MBB): The target area for the S1 medial branch is at the posterior and inferior 6 o'clock position of the L5-S1 facet joint. Under fluoroscopic guidance, the Quincke needle inserted for the L5 MBB was  redirected until contact was made with os over the inferior and postero aspect of the sacrum, at the 6 o' clock position under the L5-S1 facet joint (Target area). After negative aspiration for blood, 0.5 mL of the nerve block solution was injected without difficulty or complication. The  needle was removed intact.  Nerve block solution: 0.2% PF-Ropivacaine + Triamcinolone (40 mg/mL) diluted to a final concentration of 4 mg of Triamcinolone/mL of Ropivacaine The unused portion of the solution was discarded in the proper designated containers. Procedural Needles: 22-gauge, 3.5-inch, Quincke needles used for all levels.  Once the entire procedure was completed, the treated area was cleaned, making sure to leave some of the prepping solution back to take advantage of its long term bactericidal properties.      Illustration of the posterior view of the lumbar spine and the posterior neural structures. Laminae of L2 through S1 are labeled. DPRL5, dorsal primary ramus of L5; DPRS1, dorsal primary ramus of S1; DPR3, dorsal primary ramus of L3; FJ, facet (zygapophyseal) joint L3-L4; I, inferior articular process of L4; LB1, lateral branch of dorsal primary ramus of L1; IAB, inferior articular branches from L3 medial branch (supplies L4-L5 facet joint); IBP, intermediate branch plexus; MB3, medial branch of dorsal primary ramus of L3; NR3, third lumbar nerve root; S, superior articular process of L5; SAB, superior articular branches from L4 (supplies L4-5 facet joint also); TP3, transverse process of L3.  Vitals:   10/16/20 0940 10/16/20 0945 10/16/20 0950 10/16/20 0959  BP: 124/86 104/88 120/80 116/78  Pulse:      Resp: 18 12 16 15   Temp:    98.3 F (36.8 C)  TempSrc:      SpO2: 96% 95% 96% 96%  Weight:      Height:         Start Time: 0945 hrs. End Time: 0950 hrs.  Imaging Guidance (Spinal):          Type of Imaging Technique: Fluoroscopy Guidance (Spinal) Indication(s): Assistance in needle  guidance and placement for procedures requiring needle placement in or near specific anatomical locations not easily accessible without such assistance. Exposure Time: Please see nurses notes. Contrast: None used. Fluoroscopic Guidance: I was personally present during the use of fluoroscopy. "Tunnel Vision Technique" used to obtain the best possible view of the target area. Parallax error corrected before commencing the procedure. "Direction-depth-direction" technique used to introduce the needle under continuous pulsed fluoroscopy. Once target was reached, antero-posterior, oblique, and lateral fluoroscopic projection used confirm needle placement in all planes. Images permanently stored in EMR. Interpretation: No contrast injected. I personally interpreted the imaging intraoperatively. Adequate needle placement confirmed in multiple planes. Permanent images saved into the patient's record.  Antibiotic Prophylaxis:   Anti-infectives (From admission, onward)    None      Indication(s): None identified  Post-operative Assessment:  Post-procedure Vital Signs:  Pulse/HCG Rate: (!) 5660 Temp: 98.3 F (36.8 C) Resp: 15 BP: 116/78 SpO2: 96 %  EBL: None  Complications: No immediate post-treatment complications observed by team, or reported by patient.  Note: The patient tolerated the entire procedure well. A repeat set of vitals were taken after the procedure and the patient was kept under observation following institutional policy, for this type of procedure. Post-procedural neurological assessment was performed, showing return to baseline, prior to discharge. The patient was provided with post-procedure discharge instructions, including a section on how to identify potential problems. Should any problems arise concerning this procedure, the patient was given instructions to immediately contact , at any time, without hesitation. In any case, we plan to contact the patient by telephone for a  follow-up status report regarding this interventional procedure.  Comments:  No additional relevant information.  Plan of Care  Orders:  Orders Placed This Encounter  Procedures  LUMBAR FACET(MEDIAL BRANCH NERVE BLOCK) MBNB    Scheduling Instructions:     Procedure: Lumbar facet block (AKA.: Lumbosacral medial branch nerve block)     Side: Left-sided     Level: L3-4, L4-5, & L5-S1 Facets (L2, L3, L4, L5, & S1 Medial Branch Nerves)     Sedation: Patient's choice.     Timeframe: Today    Order Specific Question:   Where will this procedure be performed?    Answer:   [facility]   [facility] C-ARM 1-60 MIN NO REPORT    Intraoperative interpretation by procedural physician at [facility].    Standing Status:   Standing    Number of Occurrences:   1    Order Specific Question:   Reason for exam:    Answer:   Assistance in needle guidance and placement for procedures requiring needle placement in or near specific anatomical locations not easily accessible without such assistance.   Informed Consent Details: Physician/Practitioner Attestation; Transcribe to consent form and obtain patient signature    Nursing Order: Transcribe to consent form and obtain patient signature. Note: Always confirm laterality of pain with Mr. Vaca, before procedure.    Order Specific Question:   Physician/Practitioner attestation of informed consent for procedure/surgical case    Answer:   I, the physician/practitioner, attest that I have discussed with the patient the benefits, risks, side effects, alternatives, likelihood of achieving goals and potential problems during recovery for the procedure that I have provided informed consent.    Order Specific Question:   Procedure    Answer:   Lumbar Facet Block  under fluoroscopic guidance    Order Specific Question:   Physician/Practitioner performing the procedure    Answer:   Davian Wollenberg A. Laban Emperor MD    Order Specific Question:    Indication/Reason    Answer:   Low Back Pain, with our without leg pain, due to Facet Joint Arthralgia (Joint Pain) Spondylosis (Arthritis of the Spine), without myelopathy or radiculopathy (Nerve Damage).   Care order/instruction: Please confirm that the patient has stopped the Plavix (Clopidogrel) x 7-10 days prior to procedure or surgery.    Please confirm that the patient has stopped the Plavix (Clopidogrel) x 7-10 days prior to procedure or surgery.    Standing Status:   Standing    Number of Occurrences:   1   Provide equipment / supplies at bedside    "Block Tray" (Disposable  single use) Needle type: SpinalSpinal Amount/quantity: 4 Size: Medium (5-inch) Gauge: 22G    Standing Status:   Standing    Number of Occurrences:   1    Order Specific Question:   Specify    Answer:   Block Tray   Saline lock IV    SAFETY PRECAUTION: Please establish a "Saline Lock" IV access in case rapid IV access is required. Discontinue prior to discharge, but no sooner than 15 min after procedure.    Standing Status:   Standing    Number of Occurrences:   1   Bleeding precautions    Standing Status:   Standing    Number of Occurrences:   1    Chronic Opioid Analgesic:  Hydrocodone/APAP 10/325 1 tablet p.o. twice daily (20 mg/day of hydrocodone) (20 MME/day)  MME/day: 20 mg/day   Medications ordered for procedure: Meds ordered this encounter  Medications   lidocaine (XYLOCAINE) 2 % (with pres) injection 400 mg   ropivacaine (PF) 2 mg/mL (0.2%) (NAROPIN) injection 9 mL  triamcinolone acetonide (KENALOG-40) injection 40 mg   diazepam (VALIUM) tablet 10 mg    Make sure Flumazenil is available in the pyxis when using this medication. If oversedation occurs, administer 0.2 mg IV over 15 sec. If after 45 sec no response, administer 0.2 mg again over 1 min; may repeat at 1 min intervals; not to exceed 4 doses (1 mg)    Medications administered: We administered lidocaine, ropivacaine (PF) 2 mg/mL  (0.2%), triamcinolone acetonide, and diazepam.  See the medical record for exact dosing, route, and time of administration.  Follow-up plan:   Return in about 2 weeks (around 10/30/2020) for procedure day (afternoon VV) (PPE).      Interventional Therapies  Risk  Complexity Considerations:   NOTE: Plavix Anticoagulation (Stop: 7-10 days  Restart: 2 hours) Prior patient of Dr. Yves Dill  Uncontrolled type 2 diabetes with hyperglycemia  Nicotine dependence  tobacco abuse  COPD  SOB on exertion  CAD  hypertension   Planned  Pending:   Diagnostic left lumbar facet block #1    Under consideration:   Diagnostic left lumbar facet block #2  Diagnostic left SI joint block #1  Therapeutic Racz procedure #1  Diagnostic bilateral lumbar spinal cord stimulator trial    Completed:   Diagnostic left lumbar facet block x1 (06/14/2020) (100/100/75/75)  Diagnostic left caudal ESI + epidurogram x2 (01/10/2020) (100/100/1 100 x 2 weeks/50) (90/90/90/100)   Therapeutic  Palliative (PRN) options:   Therapeutic (ML) caudal ESI #3  Diagnostic/therapeutic left lumbar facet block #2      Recent Visits No visits were found meeting these conditions. Showing recent visits within past 90 days and meeting all other requirements Today's Visits Date Type Provider Dept  10/16/20 Procedure visit Delano Metz, MD [facility]  Showing today's visits and meeting all other requirements Future Appointments Date Type Provider Dept  10/30/20 Appointment Delano Metz, MD [facility]  Showing future appointments within next 90 days and meeting all other requirements Disposition: Discharge home  Discharge (Date  Time): 10/16/2020; 1000 hrs.   Primary Care Physician: Myrene Buddy, NP Location: [facility] Note by: Oswaldo Done, MD Date: 10/16/2020; Time: 12:28 PM  Disclaimer:  Medicine is not an Visual merchandiser. The only guarantee in  medicine is that nothing is guaranteed. It is important to note that the decision to proceed with this intervention was based on the information collected from the patient. The Data and conclusions were drawn from the patient's questionnaire, the interview, and the physical examination. Because the information was provided in large part by the patient, it cannot be guaranteed that it has not been purposely or unconsciously manipulated. Every effort has been made to obtain as much relevant data as possible for this evaluation. It is important to note that the conclusions that lead to this procedure are derived in large part from the available data. Always take into account that the treatment will also be dependent on availability of resources and existing treatment guidelines, considered by other Pain Management Practitioners as being common knowledge and practice, at the time of the intervention. For Medico-Legal purposes, it is also important to point out that variation in procedural techniques and pharmacological choices are the acceptable norm. The indications, contraindications, technique, and results of the above procedure should only be interpreted and judged by a Board-Certified Interventional Pain Specialist with extensive familiarity and expertise in the same exact procedure and technique.

## 2020-10-16 ENCOUNTER — Other Ambulatory Visit: Payer: Self-pay

## 2020-10-16 ENCOUNTER — Ambulatory Visit (HOSPITAL_BASED_OUTPATIENT_CLINIC_OR_DEPARTMENT_OTHER): Payer: Medicare HMO | Admitting: Pain Medicine

## 2020-10-16 ENCOUNTER — Encounter: Payer: Self-pay | Admitting: Pain Medicine

## 2020-10-16 ENCOUNTER — Ambulatory Visit
Admission: RE | Admit: 2020-10-16 | Discharge: 2020-10-16 | Disposition: A | Discharge status: Home / Self Care | Payer: Medicare HMO | Source: Ambulatory Visit | Attending: Pain Medicine | Admitting: Pain Medicine

## 2020-10-16 VITALS — BP 116/78 | HR 56 | Temp 98.3°F | Resp 15 | Ht 66.0 in | Wt 215.0 lb

## 2020-10-16 DIAGNOSIS — M47816 Spondylosis without myelopathy or radiculopathy, lumbar region: Secondary | ICD-10-CM | POA: Insufficient documentation

## 2020-10-16 DIAGNOSIS — Z7901 Long term (current) use of anticoagulants: Secondary | ICD-10-CM | POA: Insufficient documentation

## 2020-10-16 DIAGNOSIS — M47817 Spondylosis without myelopathy or radiculopathy, lumbosacral region: Secondary | ICD-10-CM | POA: Insufficient documentation

## 2020-10-16 DIAGNOSIS — M4316 Spondylolisthesis, lumbar region: Secondary | ICD-10-CM | POA: Diagnosis present

## 2020-10-16 DIAGNOSIS — M5136 Other intervertebral disc degeneration, lumbar region: Secondary | ICD-10-CM | POA: Insufficient documentation

## 2020-10-16 MED ORDER — ROPIVACAINE HCL 2 MG/ML IJ SOLN
9.0000 mL | Freq: Once | INTRAMUSCULAR | Status: AC
  Administered 2020-10-16: 9 mL via PERINEURAL

## 2020-10-16 MED ORDER — LIDOCAINE HCL 2 % IJ SOLN
20.0000 mL | Freq: Once | INTRAMUSCULAR | Status: AC
  Administered 2020-10-16: 200 mg
  Filled 2020-10-16: qty 20

## 2020-10-16 MED ORDER — TRIAMCINOLONE ACETONIDE 40 MG/ML IJ SUSP
40.0000 mg | Freq: Once | INTRAMUSCULAR | Status: AC
  Administered 2020-10-16: 40 mg
  Filled 2020-10-16: qty 1

## 2020-10-16 MED ORDER — DIAZEPAM 5 MG PO TABS
10.0000 mg | ORAL_TABLET | ORAL | Status: AC
  Administered 2020-10-16: 10 mg via ORAL
  Filled 2020-10-16: qty 2

## 2020-10-16 NOTE — Patient Instructions (Addendum)
____________________________________________________________________________________________ ° °Post-Procedure Discharge Instructions ° °Instructions: °Apply ice:  °Purpose: This will minimize any swelling and discomfort after procedure.  °When: Day of procedure, as soon as you get home. °How: Fill a plastic sandwich bag with crushed ice. Cover it with a small towel and apply to injection site. °How long: (15 min on, 15 min off) Apply for 15 minutes then remove x 15 minutes.  Repeat sequence on day of procedure, until you go to bed. °Apply heat:  °Purpose: To treat any soreness and discomfort from the procedure. °When: Starting the next day after the procedure. °How: Apply heat to procedure site starting the day following the procedure. °How long: May continue to repeat daily, until discomfort goes away. °Food intake: Start with clear liquids (like water) and advance to regular food, as tolerated.  °Physical activities: Keep activities to a minimum for the first 8 hours after the procedure. After that, then as tolerated. °Driving: If you have received any sedation, be responsible and do not drive. You are not allowed to drive for 24 hours after having sedation. °Blood thinner: (Applies only to those taking blood thinners) You may restart your blood thinner 6 hours after your procedure. °Insulin: (Applies only to Diabetic patients taking insulin) As soon as you can eat, you may resume your normal dosing schedule. °Infection prevention: Keep procedure site clean and dry. Shower daily and clean area with soap and water. °Post-procedure Pain Diary: Extremely important that this be done correctly and accurately. Recorded information will be used to determine the next step in treatment. For the purpose of accuracy, follow these rules: °Evaluate only the area treated. Do not report or include pain from an untreated area. For the purpose of this evaluation, ignore all other areas of pain, except for the treated area. °After  your procedure, avoid taking a long nap and attempting to complete the pain diary after you wake up. Instead, set your alarm clock to go off every hour, on the hour, for the initial 8 hours after the procedure. Document the duration of the numbing medicine, and the relief you are getting from it. °Do not go to sleep and attempt to complete it later. It will not be accurate. If you received sedation, it is likely that you were given a medication that may cause amnesia. Because of this, completing the diary at a later time may cause the information to be inaccurate. This information is needed to plan your care. °Follow-up appointment: Keep your post-procedure follow-up evaluation appointment after the procedure (usually 2 weeks for most procedures, 6 weeks for radiofrequencies). DO NOT FORGET to bring you pain diary with you.  ° °Expect: (What should I expect to see with my procedure?) °From numbing medicine (AKA: Local Anesthetics): Numbness or decrease in pain. You may also experience some weakness, which if present, could last for the duration of the local anesthetic. °Onset: Full effect within 15 minutes of injected. °Duration: It will depend on the type of local anesthetic used. On the average, 1 to 8 hours.  °From steroids (Applies only if steroids were used): Decrease in swelling or inflammation. Once inflammation is improved, relief of the pain will follow. °Onset of benefits: Depends on the amount of swelling present. The more swelling, the longer it will take for the benefits to be seen. In some cases, up to 10 days. °Duration: Steroids will stay in the system x 2 weeks. Duration of benefits will depend on multiple posibilities including persistent irritating factors. °Side-effects: If present, they   may typically last 2 weeks (the duration of the steroids). °Frequent: Cramps (if they occur, drink Gatorade and take over-the-counter Magnesium 450-500 mg once to twice a day); water retention with temporary  weight gain; increases in blood sugar; decreased immune system response; increased appetite. °Occasional: Facial flushing (red, warm cheeks); mood swings; menstrual changes. °Uncommon: Long-term decrease or suppression of natural hormones; bone thinning. (These are more common with higher doses or more frequent use. This is why we prefer that our patients avoid having any injection therapies in other practices.)  °Very Rare: Severe mood changes; psychosis; aseptic necrosis. °From procedure: Some discomfort is to be expected once the numbing medicine wears off. This should be minimal if ice and heat are applied as instructed. ° °Call if: (When should I call?) °You experience numbness and weakness that gets worse with time, as opposed to wearing off. °New onset bowel or bladder incontinence. (Applies only to procedures done in the spine) ° °Emergency Numbers: °Durning business hours (Monday - Thursday, 8:00 AM - 4:00 PM) (Friday, 9:00 AM - 12:00 Noon): [phone] °After hours: [phone] °NOTE: If you are having a problem and are unable connect with, or to talk to a provider, then go to your nearest urgent care or emergency department. If the problem is serious and urgent, please call 911. °____________________________________________________________________________________________ ° Pain Management Discharge Instructions ° °General Discharge Instructions : ° °If you need to reach your doctor call: Monday-Friday 8:00 am - 4:00 pm at [phone] or toll free [phone].  After clinic hours [phone] to have operator reach doctor. ° °Bring all of your medication bottles to all your appointments in the pain clinic. ° °To cancel or reschedule your appointment with Pain Management please remember to call 24 hours in advance to avoid a fee. ° °Refer to the educational materials which you have been given on: General Risks, I had my Procedure. Discharge Instructions, Post Sedation. ° °Post Procedure  Instructions: ° °The drugs you were given will stay in your system until tomorrow, so for the next 24 hours you should not drive, make any legal decisions or drink any alcoholic beverages. ° °You may eat anything you prefer, but it is better to start with liquids then soups and crackers, and gradually work up to solid foods. ° °Please notify your doctor immediately if you have any unusual bleeding, trouble breathing or pain that is not related to your normal pain. ° °Depending on the type of procedure that was done, some parts of your body may feel week and/or numb.  This usually clears up by tonight or the next day. ° °Walk with the use of an assistive device or accompanied by an adult for the 24 hours. ° °You may use ice on the affected area for the first 24 hours.  Put ice in a Ziploc bag and cover with a towel and place against area 15 minutes on 15 minutes off.  You may switch to heat after 24 hours.Facet Blocks °Patient Information ° °Description: The facets are joints in the spine between the vertebrae.  Like any joints in the body, facets can become irritated and painful.  Arthritis can also effect the facets.  By injecting steroids and local anesthetic in and around these joints, we can temporarily block the nerve supply to them.  Steroids act directly on irritated nerves and tissues to reduce selling and inflammation which often leads to decreased pain.  Facet blocks may be done anywhere along the spine from the neck to the   low back depending upon the location of your pain. °  After numbing the skin with local anesthetic (like Novocaine), a small needle is passed onto the facet joints under x-ray guidance.  You may experience a sensation of pressure while this is being done.  The entire block usually lasts about 15-25 minutes.  ° °Conditions which may be treated by facet blocks: ° °Low back/buttock pain °Neck/shoulder pain °Certain types of headaches ° °Preparation for the injection: ° °Do not eat any  solid food or dairy products within 8 hours of your appointment. °You may drink clear liquid up to 3 hours before appointment.  Clear liquids include water, black coffee, juice or soda.  No milk or cream please. °You may take your regular medication, including pain medications, with a sip of water before your appointment.  Diabetics should hold regular insulin (if taken separately) and take 1/2 normal NPH dose the morning of the procedure.  Carry some sugar containing items with you to your appointment. °A driver must accompany you and be prepared to drive you home after your procedure. °Bring all your current medications with you. °An IV may be inserted and sedation may be given at the discretion of the physician. °A blood pressure cuff, EKG and other monitors will often be applied during the procedure.  Some patients may need to have extra oxygen administered for a short period. °You will be asked to provide medical information, including your allergies and medications, prior to the procedure.  We must know immediately if you are taking blood thinners (like Coumadin/Warfarin) or if you are allergic to IV iodine contrast (dye).  We must know if you could possible be pregnant. ° °Possible side-effects: ° °Bleeding from needle site °Infection (rare, may require surgery) °Nerve injury (rare) °Numbness & tingling (temporary) °Difficulty urinating (rare, temporary) °Spinal headache (a headache worse with upright posture) °Light-headedness (temporary) °Pain at injection site (serveral days) °Decreased blood pressure (rare, temporary) °Weakness in arm/leg (temporary) °Pressure sensation in back/neck (temporary) ° ° °Call if you experience: ° °Fever/chills associated with headache or increased back/neck pain °Headache worsened by an upright position °New onset, weakness or numbness of an extremity below the injection site °Hives or difficulty breathing (go to the emergency room) °Inflammation or drainage at the injection  site(s) °Severe back/neck pain greater than usual °New symptoms which are concerning to you ° °Please note: ° °Although the local anesthetic injected can often make your back or neck feel good for several hours after the injection, the pain will likely return. It takes 3-7 days for steroids to work.  You may not notice any pain relief for at least one week. ° °If effective, we will often do a series of 2-3 injections spaced 3-6 weeks apart to maximally decrease your pain.  After the initial series, you may be a candidate for a more permanent nerve block of the facets. ° °If you have any questions, please call [phone] °[facility] °

## 2020-10-17 ENCOUNTER — Telehealth: Payer: Self-pay

## 2020-10-17 NOTE — Telephone Encounter (Signed)
Post procedure phone call.  Patient states he is doing good.  

## 2020-10-29 ENCOUNTER — Telehealth: Payer: Self-pay

## 2020-10-29 NOTE — Progress Notes (Signed)
Patient: Darin Waters  Service Category: E/M  Provider: Gaspar Cola, MD  DOB: Dec 23, 1960  DOS: 10/30/2020  Location: Office  MRN: [id number]  Setting: Ambulatory outpatient  Referring Provider: Sallee Lange, *  Type: Established Patient  Specialty: Interventional Pain Management  PCP: Sallee Lange, NP  Location: Remote location  Delivery: TeleHealth     Virtual Encounter - Pain Management PROVIDER NOTE: Information contained herein reflects review Waters annotations entered in association with encounter. Interpretation of such information Waters data should be left to medically-trained personnel. Information provided to patient can be located elsewhere in the medical record under "Patient Instructions". Document created using STT-dictation technology, any transcriptional errors that may result from process are unintentional.    Contact & Pharmacy Preferred: [phone] Home: [phone] (home) Mobile: There is no such number on file (mobile). E-mail: [email or web address]  [id number] [address]: [phone]: [phone] [place] [facility]Phone: [phone]: [phone]  Pre-screening  Mr. BBreonoffered "in-person" vs "virtual" encounter. He indicated preferring virtual for this encounter.   Reason COVID-19*  Social distancing based on CDC Waters AMA recommendations.   I contacted DNatividad Waters on 10/30/2020 via telephone.      I clearly identified myself as FGaspar Cola MD. I verified that I was speaking with the correct person using two identifiers (Name: Darin Waters Waters date of birth: [birth year].  Consent I sought verbal advanced consent from [place]Port Tobacco Villagefor virtual visit interactions. I informed Darin Waters possible security Waters privacy concerns,  risks, Waters limitations associated with providing "not-in-person" medical evaluation Waters management services. I also informed Mr. BDeshlerof the availability of "in-person" appointments. Finally, I informed him that there would be a charge for the virtual visit Waters that he could be  personally, fully or partially, financially responsible for it. Mr. BJustenexpressed understanding Waters agreed to proceed.   Historic Elements   Mr. DZYRUS HETLANDis a 60y.o. year old, male patient evaluated today after our last contact on 10/16/2020. Darin Waters has a past medical history of Arthritis, COPD (chronic obstructive pulmonary disease) ([place], Depression, Diabetes mellitus without complication ([place], GERD (gastroesophageal reflux disease), History of kidney stones (1990's ), Hypertension, Waters Sleep apnea (01/30/2016). He also  has a past surgical history that includes Colonoscopy; Rectal polypectomy (4-5 yrs ago); Anterior cervical decomp/discectomy fusion (N/A, 07/18/2015); Esophagogastroduodenoscopy (egd) with propofol (N/A, 08/03/2019); Colonoscopy with propofol (N/A, 08/03/2019); Lower Extremity Angiography (Left, 02/07/2020); Waters leg stint. Mr. BSpicklerhas a current medication list which includes the following prescription(s): amlodipine-benazepril, aspirin ec, atorvastatin, citalopram, esomeprazole, fluticasone, trelegy ellipta, furosemide, gabapentin, glimepiride, hydrocodone-acetaminophen, insulin glargine, metformin, multivitamin with minerals, nabumetone, nabumetone, onetouch verio, proair hfa, quinine, Waters spironolactone. He  reports that he has been smoking e-cigarettes. He has never used smokeless tobacco. He reports current alcohol use of about 3.0 standard drinks of alcohol per week. He reports that he does not use drugs. Mr. BLoftinhas No Known Allergies.   HPI  Today, he is being contacted for a post-procedure assessment.  Post-Procedure Evaluation  Procedure (10/16/2020):  Type: Lumbar Facet, Medial  Branch Block(s) #2        Primary Purpose: Diagnostic Region: Posterolateral Lumbosacral Spine Level: L2, L3, L4, L5, & S1 Medial Branch Level(s). Injecting these  levels blocks the L3-4, L4-5, Waters L5-S1 lumbar facet joints. Laterality: Left  Pre-procedure pain level: 6/10 Post-procedure: 2/10 (> 50% relief)  Anxiolysis: Minimal conscious anxiolysis  Effectiveness during initial hour after procedure (Ultra-Short Term Relief): 100 %.  Local anesthetic used: Long-acting (4-6 hours) Effectiveness: Defined as any analgesic benefit obtained secondary to the administration of local anesthetics. This carries significant diagnostic value as to the etiological location, or anatomical origin, of the pain. Duration of benefit is expected to coincide with the duration of the local anesthetic used.  Effectiveness during initial 4-6 hours after procedure (Short-Term Relief): 100 %.  Long-term benefit: Defined as any relief past the pharmacologic duration of the local anesthetics.  Effectiveness past the initial 6 hours after procedure (Long-Term Relief): 100 %.  Benefits, current: Defined as benefit present at the time of this evaluation.   Analgesia:   The patient indicates currently enjoying a 100% relief of his low back pain.  He was reminded that should the pain return, we can always repeat the injection but there is also the option of doing radiofrequency ablation.  His very first diagnostic facet block was done on 06/14/2020 Waters he was able to make it last until recently when he had to undergo the second injection. Function: Darin Waters reports improvement in function ROM: Darin Waters reports improvement in ROM  Pharmacotherapy Assessment   Analgesic: No chronic opioid analgesics therapy prescribed by our practice. Hydrocodone/APAP 10/325 1 tablet p.o. twice daily (20 mg/day of hydrocodone) (20 MME/day)  MME/day: 20 mg/day   Monitoring: [place] PMP: PDMP reviewed during this encounter.        Pharmacotherapy: No side-effects or adverse reactions reported. Compliance: No problems identified. Effectiveness: Clinically acceptable. Plan: Refer to "POC". UDS:  Summary  Date Value Ref Range Status  10/10/2019 Note  Final    Comment:    ==================================================================== Compliance Drug Analysis, Ur ==================================================================== Test                             Result       Flag       Units  Drug Present Waters Declared for Prescription Verification   Gabapentin                     PRESENT      EXPECTED   Citalopram                     PRESENT      EXPECTED   Desmethylcitalopram            PRESENT      EXPECTED    Desmethylcitalopram is an expected metabolite of citalopram or the    enantiomeric form, escitalopram.    Acetaminophen                  PRESENT      EXPECTED  Drug Present not Declared for Prescription Verification   Hydrocodone                    2963         UNEXPECTED ng/mg creat   Hydromorphone                  135          UNEXPECTED ng/mg creat   Dihydrocodeine                 393  UNEXPECTED ng/mg creat   Norhydrocodone                 1000         UNEXPECTED ng/mg creat    Sources of hydrocodone include scheduled prescription medications.    Hydromorphone, dihydrocodeine Waters norhydrocodone are expected    metabolites of hydrocodone. Hydromorphone Waters dihydrocodeine are    also available as scheduled prescription medications.    Butorphanol                    PRESENT      UNEXPECTED  Drug Absent but Declared for Prescription Verification   Oxycodone                      Not Detected UNEXPECTED ng/mg creat   Cyclobenzaprine                Not Detected UNEXPECTED   Salicylate                     Not Detected UNEXPECTED    Aspirin, as indicated in the declared medication list, is not always    detected even when used as  directed.  ==================================================================== Test                      Result    Flag   Units      Ref Range   Creatinine              185              mg/dL      >=20 ==================================================================== Declared Medications:  The flagging Waters interpretation on this report are based on the  following declared medications.  Unexpected results may arise from  inaccuracies in the declared medications.   **Note: The testing scope of this panel includes these medications:   Citalopram (Celexa)  Cyclobenzaprine (Flexeril)  Gabapentin (Neurontin)  Oxycodone (Percocet)   **Note: The testing scope of this panel does not include small to  moderate amounts of these reported medications:   Acetaminophen (Percocet)  Aspirin   **Note: The testing scope of this panel does not include the  following reported medications:   Albuterol (Proair HFA)  Amlodipine (Lotrel)  Benazepril (Lotrel)  Budesonide (Symbicort)  Docusate (Colace)  Esomeprazole (Nexium)  Fluticasone (Trelegy)  Formoterol (Symbicort)  Furosemide (Lasix)  Glimepiride (Amaryl)  Insulin  Metformin (Glucophage)  Multivitamin  Quinine (Qualaquin)  Simvastatin (Zocor)  Umeclidinium (Trelegy)  Vilanterol (Trelegy) ==================================================================== For clinical consultation, please call [phone]. ====================================================================      Laboratory Chemistry Profile   Renal Lab Results  Component Value Date   BUN 21 02/07/2020   CREATININE 1.23 02/07/2020   BCR 14 10/10/2019   GFRAA 61 10/10/2019   GFRNONAA >60 02/07/2020    Hepatic Lab Results  Component Value Date   AST 30 10/10/2019   ALBUMIN 4.6 10/10/2019   ALKPHOS 59 10/10/2019    Electrolytes Lab Results  Component Value Date   NA 136 10/10/2019   K 5.7 (H) 10/10/2019   CL 100 10/10/2019   CALCIUM 9.8  10/10/2019   MG 2.0 10/10/2019    Bone Lab Results  Component Value Date   25OHVITD1 33 10/10/2019   25OHVITD2 <1.0 10/10/2019   25OHVITD3 33 10/10/2019    Inflammation (CRP: Acute Phase) (ESR: Chronic Phase) Lab Results  Component Value Date   CRP 2 10/10/2019   ESRSEDRATE 16  10/10/2019         Note: Above Lab results reviewed.  Imaging  DG PAIN CLINIC C-ARM 1-60 MIN NO REPORT Fluoro was used, but no Radiologist interpretation will be provided.  Please refer to "NOTES" tab for provider progress note.  Assessment  The primary encounter diagnosis was Lumbar facet syndrome (Left). 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  Plan of Care  Problem-specific:  No problem-specific Assessment & Plan notes found for this encounter.  Mr. ANDERSEN IORIO has a current medication list which includes the following long-term medication(s): amlodipine-benazepril, atorvastatin, citalopram, esomeprazole, fluticasone, furosemide, gabapentin, glimepiride, insulin glargine, metformin, proair hfa, Waters spironolactone.  Pharmacotherapy (Medications Ordered): No orders of the defined types were placed in this encounter.  Orders:  No orders of the defined types were placed in this encounter.  Follow-up plan:   No follow-ups on file.     Interventional Therapies  Risk  Complexity Considerations:   NOTE: Plavix Anticoagulation (Stop: 7-10 days  Restart: 2 hours) Prior patient of Dr. Sharlet Salina  Uncontrolled type 2 diabetes with hyperglycemia  Nicotine dependence  tobacco abuse  COPD  SOB on exertion  CAD  hypertension      Planned  Pending:      Under consideration:   Diagnostic left lumbar facet block #3   Diagnostic  left SI joint block #1  Therapeutic Racz procedure #1  Diagnostic bilateral lumbar spinal cord stimulator trial    Completed:   Diagnostic left lumbar facet block x2 (10/16/2020) (100/100/100/100)  Diagnostic left caudal ESI + epidurogram x2 (01/10/2020) (100/100/1 100 x 2 weeks/50) (90/90/90/100)   Therapeutic  Palliative (PRN) options:   Therapeutic (ML) caudal ESI #3  Diagnostic/therapeutic left lumbar facet block #3     Recent Visits Date Type Provider Dept  10/16/20 Procedure visit Milinda Pointer, MD [facility]  Showing recent visits within past 90 days Waters meeting all other requirements Today's Visits Date Type Provider Dept  10/30/20 Telemedicine Milinda Pointer, MD [facility]  Showing today's visits Waters meeting all other requirements Future Appointments No visits were found meeting these conditions. Showing future appointments within next 90 days Waters meeting all other requirements I discussed the assessment Waters treatment plan with the patient. The patient was provided an opportunity to ask questions Waters all were answered. The patient agreed with the plan Waters demonstrated an understanding of the instructions.  Patient advised to call back or seek an in-person evaluation if the symptoms or condition worsens.  Duration of encounter: 12 minutes.  Note by: Gaspar Cola, MD Date: 10/30/2020; Time: 5:24 PM

## 2020-10-29 NOTE — Telephone Encounter (Signed)
Attempted to call patient for pre virtual appointment questions.  Phone rang and rang and there was no answering machine to leave message.

## 2020-10-30 ENCOUNTER — Ambulatory Visit: Payer: Medicare HMO | Attending: Pain Medicine | Admitting: Pain Medicine

## 2020-10-30 ENCOUNTER — Other Ambulatory Visit: Payer: Self-pay

## 2020-10-30 ENCOUNTER — Encounter: Payer: Self-pay | Admitting: Pain Medicine

## 2020-10-30 DIAGNOSIS — M47817 Spondylosis without myelopathy or radiculopathy, lumbosacral region: Secondary | ICD-10-CM | POA: Diagnosis not present

## 2020-10-30 DIAGNOSIS — M4316 Spondylolisthesis, lumbar region: Secondary | ICD-10-CM

## 2020-10-30 DIAGNOSIS — M5136 Other intervertebral disc degeneration, lumbar region: Secondary | ICD-10-CM

## 2020-10-30 DIAGNOSIS — G8929 Other chronic pain: Secondary | ICD-10-CM

## 2020-10-30 DIAGNOSIS — M545 Low back pain, unspecified: Secondary | ICD-10-CM

## 2020-10-30 DIAGNOSIS — M47816 Spondylosis without myelopathy or radiculopathy, lumbar region: Secondary | ICD-10-CM | POA: Diagnosis not present

## 2020-11-20 ENCOUNTER — Ambulatory Visit: Payer: Medicare HMO | Admitting: Internal Medicine

## 2020-11-21 ENCOUNTER — Ambulatory Visit: Payer: Medicare HMO | Admitting: Internal Medicine

## 2021-01-20 ENCOUNTER — Other Ambulatory Visit (INDEPENDENT_AMBULATORY_CARE_PROVIDER_SITE_OTHER): Payer: Self-pay | Admitting: Vascular Surgery

## 2021-01-21 ENCOUNTER — Other Ambulatory Visit (INDEPENDENT_AMBULATORY_CARE_PROVIDER_SITE_OTHER): Payer: Self-pay | Admitting: Vascular Surgery

## 2021-04-18 ENCOUNTER — Other Ambulatory Visit: Payer: Self-pay | Admitting: *Deleted

## 2021-04-18 DIAGNOSIS — Z87891 Personal history of nicotine dependence: Secondary | ICD-10-CM

## 2021-04-25 ENCOUNTER — Encounter: Payer: Self-pay | Admitting: Internal Medicine

## 2021-04-25 ENCOUNTER — Other Ambulatory Visit: Payer: Self-pay

## 2021-04-25 ENCOUNTER — Ambulatory Visit: Payer: Medicare HMO | Admitting: Internal Medicine

## 2021-04-25 VITALS — BP 122/70 | HR 61 | Temp 98.0°F | Resp 16 | Ht 66.0 in | Wt 216.0 lb

## 2021-04-25 DIAGNOSIS — J449 Chronic obstructive pulmonary disease, unspecified: Secondary | ICD-10-CM

## 2021-04-25 DIAGNOSIS — G894 Chronic pain syndrome: Secondary | ICD-10-CM

## 2021-04-25 DIAGNOSIS — R0602 Shortness of breath: Secondary | ICD-10-CM

## 2021-04-25 DIAGNOSIS — Z87891 Personal history of nicotine dependence: Secondary | ICD-10-CM | POA: Diagnosis not present

## 2021-04-25 NOTE — Patient Instructions (Signed)

## 2021-04-25 NOTE — Progress Notes (Signed)
[facility] [address] [place], [place] [address]  Pulmonary Sleep Medicine   Office Visit Note  Patient Name: Darin Waters DOB: 06/12/1960 MRN [id number]  Date of Service: 04/25/2021  Complaints/HPI: SOB. MILD DISEASE based on the last PFT. He states he is smoking. Patient states he is trying to stay active patient has been on inhalers and is using Trelegy.  Patient states that he does gain some benefit from the use of the Trelegy and has unfortunately not been able to stop smoking.  I think one of the major issues that we are facing with him is the inability to stop smoking.  Explained to him that if he continues to do so he is going to continue to have worsening of symptoms  ROS  General: (-) fever, (-) chills, (-) night sweats, (-) weakness Skin: (-) rashes, (-) itching,. Eyes: (-) visual changes, (-) redness, (-) itching. Nose and Sinuses: (-) nasal stuffiness or itchiness, (-) postnasal drip, (-) nosebleeds, (-) sinus trouble. Mouth and Throat: (-) sore throat, (-) hoarseness. Neck: (-) swollen glands, (-) enlarged thyroid, (-) neck pain. Respiratory: + cough, (-) bloody sputum, + shortness of breath, + wheezing. Cardiovascular: - ankle swelling, (-) chest pain. Lymphatic: (-) lymph node enlargement. Neurologic: (-) numbness, (-) tingling. Psychiatric: (-) anxiety, (-) depression   Current Medication: Outpatient Encounter Medications as of 04/25/2021  Medication Sig   amLODipine-benazepril (LOTREL) 10-40 MG capsule Take 1 capsule by mouth daily before breakfast.    atorvastatin (LIPITOR) 40 MG tablet Take 40 mg by mouth daily.    citalopram (CELEXA) 10 MG tablet Take 10 mg by mouth daily before breakfast.    esomeprazole (NEXIUM) 20 MG capsule Take 20 mg by mouth daily before breakfast.   fluticasone (FLONASE) 50 MCG/ACT nasal spray Place 2 sprays into both nostrils daily.   Fluticasone-Umeclidin-Vilant (TRELEGY ELLIPTA) 100-62.5-25 MCG/INH AEPB Inhale 1  puff into the lungs daily. (Patient taking differently: Inhale 1 puff into the lungs daily as needed (COPD).)   furosemide (LASIX) 20 MG tablet Take 20 mg by mouth daily.   gabapentin (NEURONTIN) 300 MG capsule Take 300 mg by mouth 2 (two) times daily.    glimepiride (AMARYL) 2 MG tablet Take 2 mg by mouth in the morning and at bedtime.    HYDROcodone-acetaminophen (NORCO) 10-325 MG tablet Take 2 tablets by mouth in the morning and at bedtime.    insulin glargine (LANTUS) 100 UNIT/ML Solostar Pen Inject 65 Units into the skin at bedtime.    metFORMIN (GLUCOPHAGE) 500 MG tablet Take 1,000 mg by mouth 2 (two) times daily with a meal.    Multiple Vitamins-Minerals (MULTIVITAMIN WITH MINERALS) tablet Take 1 tablet by mouth daily.   nabumetone (RELAFEN) 750 MG tablet Take 750 mg by mouth 2 (two) times daily.   nabumetone (RELAFEN) 750 MG tablet Take 2 tablets by mouth daily.   ONETOUCH VERIO test strip SMARTSIG:Via Meter   PROAIR HFA 108 (90 Base) MCG/ACT inhaler Take 2 puffs by mouth every 6 (six) hours as needed for wheezing or shortness of breath.    quiNINE (QUALAQUIN) 324 MG capsule Take 1 capsule by mouth daily.    spironolactone (ALDACTONE) 50 MG tablet Take 50 mg by mouth daily.   No facility-administered encounter medications on file as of 04/25/2021.    Surgical History: Past Surgical History:  Procedure Laterality Date   ANTERIOR CERVICAL DECOMP/DISCECTOMY FUSION N/A 07/18/2015   Procedure: CERVICALFIVE-SIX, CERVICAL SIX-SEVEN ANTERIOR CERVICAL DECOMPRESSION/DISCECTOMY FUSION;  Surgeon: Tressie StalkerJeffrey Jenkins, MD;  Location: [facility];  Service: Neurosurgery;  Laterality: N/A;  C56 C67 anterior cervical decompression with fusion interbody prosthesis plating and bonegraft   COLONOSCOPY     COLONOSCOPY WITH PROPOFOL N/A 08/03/2019   Procedure: COLONOSCOPY WITH PROPOFOL;  Surgeon: Toledo, Boykin Nearing, MD;  Location: [facility];  Service: Gastroenterology;  Laterality: N/A;    ESOPHAGOGASTRODUODENOSCOPY (EGD) WITH PROPOFOL N/A 08/03/2019   Procedure: ESOPHAGOGASTRODUODENOSCOPY (EGD) WITH PROPOFOL;  Surgeon: Toledo, Boykin Nearing, MD;  Location: [facility] ENDOSCOPY;  Service: Gastroenterology;  Laterality: N/A;   leg stint     nov 2021   LOWER EXTREMITY ANGIOGRAPHY Left 02/07/2020   Procedure: LOWER EXTREMITY ANGIOGRAPHY;  Surgeon: Renford Dills, MD;  Location: [facility] CV [facility];  Service: Cardiovascular;  Laterality: Left;   RECTAL POLYPECTOMY  4-5 yrs ago    Medical History: Past Medical History:  Diagnosis Date   Arthritis    DDD- lumbar , thumbs - both hands    COPD (chronic obstructive pulmonary disease) (HCC)    Depression    Diabetes mellitus without complication (HCC)    GERD (gastroesophageal reflux disease)    History of kidney stones 1990's    lithotripsy done    Hypertension    Sleep apnea 01/30/2016   [facility] [place], results not avail to pt. yet    Family History: Family History  Problem Relation Age of Onset   Diabetes Mother    Asthma Mother    Hypertension Mother    Hyperlipidemia Mother    Hyperlipidemia Father    Dementia Father    Hypertension Father    Heart Problems Father        Visual merchandiser    Social History: Social History   Socioeconomic History   Marital status: Married    Spouse name: Not on file   Number of children: Not on file   Years of education: Not on file   Highest education level: Not on file  Occupational History   Not on file  Tobacco Use   Smoking status: Every Day    Types: E-cigarettes    Last attempt to quit: 05/2016    Years since quitting: 4.9   Smokeless tobacco: Never   Tobacco comments:    pt in the middle of wanting to stop  Vaping Use   Vaping Use: Every day   Substances: Nicotine  Substance and Sexual Activity   Alcohol use: Yes    Alcohol/week: 3.0 standard drinks    Types: 3 Cans of beer per week    Comment: daily    Drug use: No   Sexual activity: Not on file   Other Topics Concern   Not on file  Social History Narrative   Not on file   Social Determinants of Health   Financial Resource Strain: Not on file  Food Insecurity: Not on file  Transportation Needs: Not on file  Physical Activity: Not on file  Stress: Not on file  Social Connections: Not on file  Intimate Partner Violence: Not on file    Vital Signs: Blood pressure 122/70, pulse 61, temperature 98 F (36.7 C), resp. rate 16, height 5\' 6"  (1.676 m), weight 216 lb (98 kg), SpO2 95 %.  Examination: General Appearance: The patient is well-developed, well-nourished, and in no distress. Skin: Gross inspection of skin unremarkable. Head: normocephalic, no gross deformities. Eyes: no gross deformities noted. ENT: ears appear grossly normal no exudates. Neck: Supple. No thyromegaly. No LAD. Respiratory: no rhonchi noted. Cardiovascular: Normal S1  and S2 without murmur or rub. Extremities: No cyanosis. pulses are equal. Neurologic: Alert and oriented. No involuntary movements.  LABS: No results found for this or any previous visit (from the past 2160 hour(s)).  Radiology: DG PAIN CLINIC C-ARM 1-60 MIN NO REPORT  Result Date: 10/16/2020 Fluoro was used, but no Radiologist interpretation will be provided. Please refer to "NOTES" tab for provider progress note.   No results found.  No results found.    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) 06/14/2020   Compression fracture of L1 lumbar vertebra, sequela 06/14/2020   Abnormal MRI, lumbar spine (09/13/2018) 06/14/2020   Epidural fibrosis 06/14/2020   History of lumbar spinal fusion 06/14/2020   Chronic low back pain (Left) w/o sciatica  05/28/2020   Chronic anticoagulation (Plavix) 02/15/2020   Lumbar facet syndrome (Left) 02/15/2020   Chronic sacroiliac joint pain (Left) 02/15/2020   Aortic atherosclerosis (HCC) 01/30/2020   PAD (peripheral artery disease) (HCC) 12/21/2019   Chronic low back pain (2ry area of Pain) (Bilateral) (L>R) w/ sciatica (Bilateral) 10/10/2019   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   Chronic lower extremity pain (1ry area of Pain) (Bilateral) (L>R) 10/10/2019   COPD (chronic obstructive pulmonary disease) (HCC) 10/09/2019   CAD (coronary artery disease) 10/09/2019   Disorder of skeletal system 10/09/2019   Pharmacologic therapy 10/09/2019   Problems influencing health status 10/09/2019   Spondylolisthesis, lumbar region 12/10/2018   Nicotine dependence with current use 06/23/2017   Morbid obesity (HCC) 06/23/2017   Coronary artery disease due to lipid rich plaque 05/14/2017   Chronic, continuous use of opioids 05/14/2017   Fatty liver disease, nonalcoholic 05/14/2017   Obesity (BMI 30-39.9) 05/14/2017   Spondylolisthesis of lumbar region 02/13/2016   Lumbar radiculopathy 10/26/2015   Spinal stenosis of lumbar region 10/26/2015   Chronic neck pain 08/14/2015   Spondylolisthesis 08/14/2015   Cervical herniated disc 07/18/2015   Cervical radiculopathy 06/14/2015   Cervical spondylosis with myelopathy 06/14/2015   Spinal stenosis in cervical region 06/14/2015   SOB (shortness of breath) on exertion 07/13/2014   DDD (degenerative disc disease), lumbar 02/28/2014   Lumbar radiculitis (S1 dermatome) (Bilateral) (L>R) 01/25/2014   Obstructive chronic bronchitis without exacerbation (HCC) 06/14/2013   Chronic pain syndrome 06/14/2013   Depression (emotion) 06/14/2013   Hyperlipidemia, unspecified 06/14/2013   Hypertension 06/14/2013   Microalbuminuric diabetic nephropathy (HCC) 06/14/2013   Tobacco abuse 06/14/2013   Uncontrolled type 2 diabetes mellitus with hyperglycemia (HCC) 03/31/2006     1. SOB (shortness of breath) Continues to have worsening of symptoms ongoing inhaler use will be continued we will continue to monitor. - Spirometry with Graph  2. Obstructive chronic bronchitis without exacerbation (HCC) On Trelegy does gain some benefit from usage of Trelegy.  3. Chronic pain syndrome Pain management  4.  Nicotine abuse patient needs to stop smoking Smoking cessation instruction/counseling given:  counseled patient on the dangers of tobacco use, advised patient to stop smoking, and reviewed strategies to maximize success   General Counseling: I have discussed the findings of the evaluation and examination with Emerson.  I have also discussed any further diagnostic evaluation thatmay be needed or ordered today. Tayon verbalizes understanding of the findings of todays visit. We also reviewed his medications today and discussed drug interactions  and side effects including but not limited excessive drowsiness and altered mental states. We also discussed that there is always a risk not just to him but also people around him. he has been encouraged to call the office with any questions or concerns that should arise related to todays visit.  Orders Placed This Encounter  Procedures   Spirometry with Graph    Order Specific Question:   Where should this test be performed?    Answer:   Other     Time spent: 15  I have personally obtained a history, examined the patient, evaluated laboratory and imaging results, formulated the assessment and plan and placed orders.    Yevonne Pax, MD [facility] and Critical Care Sleep medicine

## 2021-05-08 ENCOUNTER — Ambulatory Visit
Admission: RE | Admit: 2021-05-08 | Discharge: 2021-05-08 | Disposition: A | Discharge status: Home / Self Care | Payer: Medicare HMO | Source: Ambulatory Visit | Attending: Nurse Practitioner | Admitting: Nurse Practitioner

## 2021-05-08 ENCOUNTER — Other Ambulatory Visit: Payer: Self-pay

## 2021-05-08 ENCOUNTER — Ambulatory Visit (INDEPENDENT_AMBULATORY_CARE_PROVIDER_SITE_OTHER): Payer: Medicare HMO | Admitting: Acute Care

## 2021-05-08 DIAGNOSIS — Z87891 Personal history of nicotine dependence: Secondary | ICD-10-CM

## 2021-05-08 NOTE — Progress Notes (Signed)
Virtual Visit via Telepnone Note  I connected with Darin Waters on 02/12/21 at  2:00 PM EST by telephone and verified that I am speaking with the correct person using two identifiers.  Location: Patient: Home Provider: Working form home   I discussed the limitations, risks, security and privacy concerns of performing an evaluation and management service by telephone and the availability of in person appointments. I also discussed with the patient that there may be a patient responsible charge related to this service. The patient expressed understanding and agreed to proceed.  Shared Decision Making Visit Lung Cancer Screening Program [phone])   Eligibility: Age 61 y.o. Pack Years Smoking History Calculation 95 (# packs/per year x # years smoked) Recent History of coughing up blood  no Unexplained weight loss? no ( >Than 15 pounds within the last 6 months ) Prior History Lung / other cancer no (Diagnosis within the last 5 years already requiring surveillance chest CT Scans). Smoking Status Former Smoker Former Smokers: Years since quit: 4 years  Quit Date: 2019 (stopped smoking cigarettes but started using e-cigarettes  Visit Components: Discussion included one or more decision making aids. yes Discussion included risk/benefits of screening. yes Discussion included potential follow up diagnostic testing for abnormal scans. yes Discussion included meaning and risk of over diagnosis. yes Discussion included meaning and risk of False Positives. yes Discussion included meaning of total radiation exposure. yes  Counseling Included: Importance of adherence to annual lung cancer LDCT screening. yes Impact of comorbidities on ability to participate in the program. yes Ability and willingness to under diagnostic treatment. yes  Smoking Cessation Counseling: Current Smokers:  Discussed importance of smoking cessation. yes Information about tobacco cessation classes and interventions  provided to patient. yes Patient provided with "ticket" for LDCT Scan. yes Symptomatic Patient. yes  Counseling(Intermediate counseling: > three minutes) [address] Diagnosis Code: Tobacco Use Z72.0 Asymptomatic Patient no  Counseling NA Former Smokers:  Discussed the importance of maintaining cigarette abstinence. Diagnosis Code: Personal History of Nicotine Dependence. Q46.962 Information about tobacco cessation classes and interventions provided to patient. Yes Patient provided with "ticket" for LDCT Scan. yes Written Order for Lung Cancer Screening with LDCT placed in Epic. Yes (CT Chest Lung Cancer Screening Low Dose W/O CM) [id number] Z12.2-Screening of respiratory organs Z87.891-Personal history of nicotine dependence   I spent 25 minutes of face to face time with him discussing the risks and benefits of lung cancer screening. We viewed a power point together that explained in detail the above noted topics. We took the time to pause the power point at intervals to allow for questions to be asked and answered to ensure understanding. We discussed that he had taken the single most powerful action possible to decrease his risk of developing lung cancer when he quit smoking. I counseled him to remain smoke free, and to contact me if he ever had the desire to smoke again so that I can provide resources and tools to help support the effort to remain smoke free. We discussed the time and location of the scan, and that either  Abigail Miyamoto RN or I will call with the results within  24-48 hours of receiving them. he has my card and contact information in the event he needs to speak with me, in addition to a copy of the power point we reviewed as a resource. He verbalized understanding of all of the above and had no further questions upon leaving the office.    I explained to the  patient that there has been a high incidence of coronary artery disease noted on these exams. I explained that this is a  non-gated exam therefore degree or severity cannot be determined. This patient is on statin therapy. I have asked the patient to follow-up with their PCP regarding any incidental finding of coronary artery disease and management with diet or medication as they feel is clinically indicated. The patient verbalized understanding of the above and had no further questions.  I spent 3 minutes counseling on smoking cessation and the health risks of continued tobacco abuse   Shandel Busic D. Tiburcio Pea, NP-C [facility] Personal contact information can be found on Amion  05/08/2021, 11:48 AM

## 2021-05-08 NOTE — Patient Instructions (Signed)
Thank you for participating in the [place] Lung Cancer Screening Program. °It was our pleasure to meet you today. °We will call you with the results of your scan within the next few days. °Your scan will be assigned a Lung RADS category score by the physicians reading the scans.  °This Lung RADS score determines follow up scanning.  °See below for description of categories, and follow up screening recommendations. °We will be in touch to schedule your follow up screening annually or based on recommendations of our providers. °We will fax a copy of your scan results to your Primary Care Physician, or the physician who referred you to the program, to ensure they have the results. °Please call the office if you have any questions or concerns regarding your scanning experience or results.  °Our office number is [phone]. °Please speak with Denise Phelps, RN. She is our Lung Cancer Screening RN. °If she is unavailable when you call, please have the office staff send her a message. She will return your call at her earliest convenience. °Remember, if your scan is normal, we will scan you annually as long as you continue to meet the criteria for the program. (Age 55-77, Current smoker or smoker who has quit within the last 15 years). °If you are a smoker, remember, quitting is the single most powerful action that you can take to decrease your risk of lung cancer and other pulmonary, breathing related problems. °We know quitting is hard, and we are here to help.  °Please let us know if there is anything we can do to help you meet your goal of quitting. °If you are a former smoker, congratulations. We are proud of you! Remain smoke free! °Remember you can refer friends or family members through the number above.  °We will screen them to make sure they meet criteria for the program. °Thank you for helping us take better care of you by participating in Lung Screening. ° °You can receive free nicotine replacement therapy  ( patches, gum or mints) by calling 1-800-QUIT NOW. Please call so we can get you on the path to becoming  a non-smoker. I know it is hard, but you can do this! ° °Lung RADS Categories: ° °Lung RADS 1: no nodules or definitely non-concerning nodules.  °Recommendation is for a repeat annual scan in 12 months. ° °Lung RADS 2:  nodules that are non-concerning in appearance and behavior with a very low likelihood of becoming an active cancer. °Recommendation is for a repeat annual scan in 12 months. ° °Lung RADS 3: nodules that are probably non-concerning , includes nodules with a low likelihood of becoming an active cancer.  Recommendation is for a 6-month repeat screening scan. Often noted after an upper respiratory illness. We will be in touch to make sure you have no questions, and to schedule your 6-month scan. ° °Lung RADS 4 A: nodules with concerning findings, recommendation is most often for a follow up scan in 3 months or additional testing based on our provider's assessment of the scan. We will be in touch to make sure you have no questions and to schedule the recommended 3 month follow up scan. ° °Lung RADS 4 B:  indicates findings that are concerning. We will be in touch with you to schedule additional diagnostic testing based on our provider's  assessment of the scan. ° °Hypnosis for smoking cessation  °[employer] °[phone] ° °Acupuncture for smoking cessation  °[facility] °[phone]  °

## 2021-05-10 ENCOUNTER — Other Ambulatory Visit: Payer: Self-pay

## 2021-05-10 DIAGNOSIS — Z87891 Personal history of nicotine dependence: Secondary | ICD-10-CM

## 2021-05-23 ENCOUNTER — Ambulatory Visit: Payer: Medicare HMO | Admitting: Dermatology

## 2021-09-09 ENCOUNTER — Ambulatory Visit (INDEPENDENT_AMBULATORY_CARE_PROVIDER_SITE_OTHER): Payer: Medicare HMO | Admitting: Vascular Surgery

## 2021-09-09 ENCOUNTER — Encounter (INDEPENDENT_AMBULATORY_CARE_PROVIDER_SITE_OTHER): Payer: Self-pay | Admitting: Vascular Surgery

## 2021-09-09 ENCOUNTER — Ambulatory Visit (INDEPENDENT_AMBULATORY_CARE_PROVIDER_SITE_OTHER): Payer: Medicare HMO

## 2021-09-09 VITALS — BP 124/65 | HR 65 | Resp 16 | Wt 218.4 lb

## 2021-09-09 DIAGNOSIS — I25118 Atherosclerotic heart disease of native coronary artery with other forms of angina pectoris: Secondary | ICD-10-CM

## 2021-09-09 DIAGNOSIS — I70213 Atherosclerosis of native arteries of extremities with intermittent claudication, bilateral legs: Secondary | ICD-10-CM

## 2021-09-09 DIAGNOSIS — I10 Essential (primary) hypertension: Secondary | ICD-10-CM

## 2021-09-09 DIAGNOSIS — J449 Chronic obstructive pulmonary disease, unspecified: Secondary | ICD-10-CM | POA: Diagnosis not present

## 2021-09-09 DIAGNOSIS — E1165 Type 2 diabetes mellitus with hyperglycemia: Secondary | ICD-10-CM

## 2021-09-09 NOTE — Progress Notes (Signed)
MRN : [id number]  Darin Waters is a 61 y.o. (Sep 08, 1960) male who presents with chief complaint of check circulation.  History of Present Illness:  The patient returns to the office for followup and review status post angiogram with intervention on 02/07/2020.    Procedure:    Percutaneous transluminal angioplasty and stent placement left common iliac artery with a 12 x 40 life star stent   The patient notes his walking remains stable , no decrease in his walking distance or increase in leg pain.  No interval shortening of the patient's claudication distance or rest pain symptoms. Previous wounds have now healed.  No new ulcers or wounds have occurred since the last visit.   There have been no significant changes to the patient's overall health care.   The patient denies amaurosis fugax or recent TIA symptoms. There are no recent neurological changes noted. The patient denies history of DVT, PE or superficial thrombophlebitis. The patient denies recent episodes of angina or shortness of breath.   ABI's Rt=1.08 and Lt=1.08  (previous ABI's Rt=1.25 and Lt=1.19)  Previous duplex US of the aorto iliac arterial system shows widely patent iliac stent  Current Meds  Medication Sig   amLODipine-benazepril (LOTREL) 10-40 MG capsule Take 1 capsule by mouth daily before breakfast.    atorvastatin (LIPITOR) 40 MG tablet Take 40 mg by mouth daily.    citalopram (CELEXA) 10 MG tablet Take 10 mg by mouth daily before breakfast.    esomeprazole (NEXIUM) 20 MG capsule Take 20 mg by mouth daily before breakfast.   fluticasone (FLONASE) 50 MCG/ACT nasal spray Place 2 sprays into both nostrils daily.   Fluticasone-Umeclidin-Vilant (TRELEGY ELLIPTA) 100-62.5-25 MCG/INH AEPB Inhale 1 puff into the lungs daily. (Patient taking differently: Inhale 1 puff into the lungs daily as needed (COPD).)   furosemide (LASIX) 20 MG tablet Take 20 mg by mouth daily.   gabapentin (NEURONTIN) 300 MG capsule Take  300 mg by mouth 2 (two) times daily.    glimepiride (AMARYL) 2 MG tablet Take 2 mg by mouth in the morning and at bedtime.    HYDROcodone-acetaminophen (NORCO) 10-325 MG tablet Take 2 tablets by mouth in the morning and at bedtime.    insulin glargine (LANTUS) 100 UNIT/ML Solostar Pen Inject 65 Units into the skin once a week.   metFORMIN (GLUCOPHAGE) 500 MG tablet Take 1,000 mg by mouth 2 (two) times daily with a meal.    Multiple Vitamins-Minerals (MULTIVITAMIN WITH MINERALS) tablet Take 1 tablet by mouth daily.   nabumetone (RELAFEN) 750 MG tablet Take 750 mg by mouth 2 (two) times daily.   nabumetone (RELAFEN) 750 MG tablet Take 2 tablets by mouth daily.   ONETOUCH VERIO test strip SMARTSIG:Via Meter   OZEMPIC, 0.25 OR 0.5 MG/DOSE, 2 MG/3ML SOPN Inject 0.5 mg into the skin once a week.   PROAIR HFA 108 (90 Base) MCG/ACT inhaler Take 2 puffs by mouth every 6 (six) hours as needed for wheezing or shortness of breath.    quiNINE (QUALAQUIN) 324 MG capsule Take 1 capsule by mouth daily.    spironolactone (ALDACTONE) 50 MG tablet Take 50 mg by mouth daily.    Past Medical History:  Diagnosis Date   Arthritis    DDD- lumbar , thumbs - both hands    COPD (chronic obstructive pulmonary disease) (HCC)    Depression    Diabetes mellitus without complication (HCC)    GERD (gastroesophageal reflux disease)    History of  kidney stones 1990's    lithotripsy done    Hypertension    Sleep apnea 01/30/2016   [facility] in [place], results not avail to pt. yet    Past Surgical History:  Procedure Laterality Date   ANTERIOR CERVICAL DECOMP/DISCECTOMY FUSION N/A 07/18/2015   Procedure: CERVICALFIVE-SIX, CERVICAL SIX-SEVEN ANTERIOR CERVICAL DECOMPRESSION/DISCECTOMY FUSION;  Surgeon: Tressie Stalker, MD;  Location: [facility];  Service: Neurosurgery;  Laterality: N/A;  C56 C67 anterior cervical decompression with fusion interbody prosthesis plating and bonegraft   COLONOSCOPY     COLONOSCOPY  WITH PROPOFOL N/A 08/03/2019   Procedure: COLONOSCOPY WITH PROPOFOL;  Surgeon: Toledo, Boykin Nearing, MD;  Location: [facility];  Service: Gastroenterology;  Laterality: N/A;   ESOPHAGOGASTRODUODENOSCOPY (EGD) WITH PROPOFOL N/A 08/03/2019   Procedure: ESOPHAGOGASTRODUODENOSCOPY (EGD) WITH PROPOFOL;  Surgeon: Toledo, Boykin Nearing, MD;  Location: [facility];  Service: Gastroenterology;  Laterality: N/A;   leg stint     nov 2021   LOWER EXTREMITY ANGIOGRAPHY Left 02/07/2020   Procedure: LOWER EXTREMITY ANGIOGRAPHY;  Surgeon: Renford Dills, MD;  Location: [facility];  Service: Cardiovascular;  Laterality: Left;   RECTAL POLYPECTOMY  4-5 yrs ago    Social History Social History   Tobacco Use   Smoking status: Every Day    Types: E-cigarettes    Last attempt to quit: 05/2016    Years since quitting: 5.3   Smokeless tobacco: Never   Tobacco comments:    pt in the middle of wanting to stop  Vaping Use   Vaping Use: Every day   Substances: Nicotine  Substance Use Topics   Alcohol use: Yes    Alcohol/week: 3.0 standard drinks of alcohol    Types: 3 Cans of beer per week    Comment: daily    Drug use: No    Family History Family History  Problem Relation Age of Onset   Diabetes Mother    Asthma Mother    Hypertension Mother    Hyperlipidemia Mother    Hyperlipidemia Father    Dementia Father    Hypertension Father    Heart Problems Father        pace maker    No Known Allergies   REVIEW OF SYSTEMS (Negative unless checked)  Constitutional: Weight loss  Fever  Chills Cardiac: Chest pain   Chest pressure   Palpitations   Shortness of breath when laying flat   Shortness of breath with exertion. Vascular:  Pain in legs with walking   Pain in legs at rest  History of DVT   Phlebitis   Swelling in legs   Varicose veins   Non-healing ulcers Pulmonary:   Uses home oxygen   Productive cough   Hemoptysis   Wheeze  COPD    Asthma Neurologic:  Dizziness   Seizures   History of stroke   History of TIA  Aphasia   Vissual changes   Weakness or numbness in arm   Weakness or numbness in leg Musculoskeletal:   Joint swelling   Joint pain   Low back pain Hematologic:  Easy bruising  Easy bleeding   Hypercoagulable state   Anemic Gastrointestinal:  Diarrhea   Vomiting  Gastroesophageal reflux/heartburn   Difficulty swallowing. Genitourinary:  Chronic kidney disease   Difficult urination  Frequent urination   Blood in urine Skin:  Rashes   Ulcers  Psychological:  History of anxiety    History of major depression.  Physical Examination  Vitals:   09/09/21 1005  BP: 124/65  Pulse: 65  Resp: 16  Weight: 218 lb 6.4 oz (99.1 kg)   Body mass index is 35.25 kg/m. Gen: WD/WN, NAD Head: [place]/AT, No temporalis wasting.  Ear/Nose/Throat: Hearing grossly intact, nares w/o erythema or drainage Eyes: PER, EOMI, sclera nonicteric.  Neck: Supple, no masses.  No bruit or JVD.  Pulmonary:  Good air movement, no audible wheezing, no use of accessory muscles.  Cardiac: RRR, normal S1, S2, no Murmurs. Vascular:  mild trophic changes, no open wounds Vessel Right Left  Radial Palpable Palpable  PT Palpable Trace  Palpable  DP  Palpable Trace Palpable  Gastrointestinal: soft, non-distended. No guarding/no peritoneal signs.  Musculoskeletal: M/S 5/5 throughout.  No visible deformity.  Neurologic: CN 2-12 intact. Pain and light touch intact in extremities.  Symmetrical.  Speech is fluent. Motor exam as listed above. Psychiatric: Judgment intact, Mood & affect appropriate for pt's clinical situation. Dermatologic: No rashes or ulcers noted.  No changes consistent with cellulitis.   CBC Lab Results  Component Value Date   WBC 20.2 (H) 02/14/2016   HGB 14.0 02/14/2016   HCT 40.8 02/14/2016   MCV 96.7 02/14/2016   PLT 219 02/14/2016    BMET    Component Value Date/Time    NA 136 10/10/2019 1642   NA 140 04/11/2011 0738   K 5.7 (H) 10/10/2019 1642   K 3.7 04/11/2011 0738   CL 100 10/10/2019 1642   CL 105 04/11/2011 0738   CO2 26 02/14/2016 0220   CO2 28 04/11/2011 0738   GLUCOSE 165 (H) 10/10/2019 1642   GLUCOSE 212 (H) 02/14/2016 0220   GLUCOSE 155 (H) 04/11/2011 0738   BUN 21 02/07/2020 0730   BUN 20 10/10/2019 1642   BUN 14 04/11/2011 0738   CREATININE 1.23 02/07/2020 0730   CREATININE 0.85 04/11/2011 0738   CALCIUM 9.8 10/10/2019 1642   CALCIUM 9.3 04/11/2011 0738   GFRNONAA >60 02/07/2020 0730   GFRNONAA >60 04/11/2011 0738   GFRAA 61 10/10/2019 1642   GFRAA >60 04/11/2011 0738   CrCl cannot be calculated (Patient's most recent lab result is older than the maximum 21 days allowed.).  COAG No results found for: "INR", "PROTIME"  Radiology No results found.   Assessment/Plan 1. Atherosclerosis of native artery of both lower extremities with intermittent claudication (HCC)  Recommend:  The patient has evidence of atherosclerosis of the lower extremities with claudication.  The patient does not voice lifestyle limiting changes at this point in time.  Noninvasive studies do not suggest clinically significant change.  No invasive studies, angiography or surgery at this time The patient should continue walking and begin a more formal exercise program.  The patient should continue antiplatelet therapy and aggressive treatment of the lipid abnormalities  No changes in the patient's medications at this time  Continued surveillance is indicated as atherosclerosis is likely to progress with time.    The patient will continue follow up with noninvasive studies as ordered.   - VAS US ABI WITH/WO TBI; Future  2. Hypertension, unspecified type Continue antihypertensive medications as already ordered, these medications have been reviewed and there are no changes at this time.   3. Coronary artery disease of native artery of native heart  with stable angina pectoris (HCC) Continue cardiac and antihypertensive medications as already ordered and reviewed, no changes at this time.  Continue statin as ordered and reviewed, no changes at this time  Nitrates PRN for chest pain   4. Chronic obstructive pulmonary  disease, unspecified COPD type (HCC) Continue pulmonary medications and aerosols as already ordered, these medications have been reviewed and there are no changes at this time.    5. Uncontrolled type 2 diabetes mellitus with hyperglycemia (HCC) Continue hypoglycemic medications as already ordered, these medications have been reviewed and there are no changes at this time.  Hgb A1C to be monitored as already arranged by primary service     Levora Dredge, MD  09/09/2021 10:09 AM

## 2021-10-22 ENCOUNTER — Encounter: Payer: Self-pay | Admitting: Nurse Practitioner

## 2021-10-22 ENCOUNTER — Ambulatory Visit: Payer: Medicare HMO | Admitting: Nurse Practitioner

## 2021-10-22 ENCOUNTER — Ambulatory Visit: Payer: Medicare HMO | Admitting: Internal Medicine

## 2021-10-22 VITALS — BP 120/60 | HR 60 | Temp 98.4°F | Resp 16 | Ht 66.0 in | Wt 216.4 lb

## 2021-10-22 DIAGNOSIS — J449 Chronic obstructive pulmonary disease, unspecified: Secondary | ICD-10-CM | POA: Diagnosis not present

## 2021-10-22 DIAGNOSIS — R0602 Shortness of breath: Secondary | ICD-10-CM | POA: Diagnosis not present

## 2021-10-22 MED ORDER — TRELEGY ELLIPTA 100-62.5-25 MCG/ACT IN AEPB: 1.0000 | INHALATION_SPRAY | Freq: Every day | RESPIRATORY_TRACT | 11 refills | Status: DC

## 2021-10-22 NOTE — Progress Notes (Signed)
[facility] [address] [place], [place] [address]  Internal MEDICINE  Office Visit Note  Patient Name: Darin Waters  [id number]  [id number]  Date of Service: 10/22/2021  Chief Complaint  Patient presents with   COPD    6 month f/u   Shortness of Breath    HPI Darin Waters presents for a follow up visit for COPD and medication refills. He has been using trelegy ellipta and reports that this medication is effective for him. He does need refills. He rarely uses his rescue inhaler. He is doing well and denies SOB, chest tightness, cough or wheezing right now.  Her spirometry was normal with only a slight decreased in FVC and FEV1 by approx 3%     Current Medication: Outpatient Encounter Medications as of 10/22/2021  Medication Sig   amLODipine-benazepril (LOTREL) 10-40 MG capsule Take 1 capsule by mouth daily before breakfast.    atorvastatin (LIPITOR) 40 MG tablet Take 40 mg by mouth daily.    citalopram (CELEXA) 10 MG tablet Take 10 mg by mouth daily before breakfast.    esomeprazole (NEXIUM) 20 MG capsule Take 20 mg by mouth daily before breakfast.   fluticasone (FLONASE) 50 MCG/ACT nasal spray Place 2 sprays into both nostrils daily.   Fluticasone-Umeclidin-Vilant (TRELEGY ELLIPTA) 100-62.5-25 MCG/ACT AEPB Inhale 1 puff into the lungs daily.   furosemide (LASIX) 20 MG tablet Take 20 mg by mouth daily.   gabapentin (NEURONTIN) 300 MG capsule Take 300 mg by mouth 2 (two) times daily.    glimepiride (AMARYL) 2 MG tablet Take 2 mg by mouth in the morning and at bedtime.    HYDROcodone-acetaminophen (NORCO) 10-325 MG tablet Take 2 tablets by mouth in the morning and at bedtime.    insulin glargine (LANTUS) 100 UNIT/ML Solostar Pen Inject 65 Units into the skin once a week.   metFORMIN (GLUCOPHAGE) 500 MG tablet Take 1,000 mg by mouth 2 (two) times daily with a meal.    Multiple Vitamins-Minerals (MULTIVITAMIN WITH MINERALS) tablet Take 1 tablet by mouth daily.   nabumetone  (RELAFEN) 750 MG tablet Take 2 tablets by mouth daily.   ONETOUCH VERIO test strip SMARTSIG:Via Meter   OZEMPIC, 0.25 OR 0.5 MG/DOSE, 2 MG/3ML SOPN Inject 0.5 mg into the skin once a week.   PROAIR HFA 108 (90 Base) MCG/ACT inhaler Take 2 puffs by mouth every 6 (six) hours as needed for wheezing or shortness of breath.    quiNINE (QUALAQUIN) 324 MG capsule Take 1 capsule by mouth daily.    spironolactone (ALDACTONE) 50 MG tablet Take 50 mg by mouth daily.   [DISCONTINUED] Fluticasone-Umeclidin-Vilant (TRELEGY ELLIPTA) 100-62.5-25 MCG/INH AEPB Inhale 1 puff into the lungs daily.   [DISCONTINUED] nabumetone (RELAFEN) 750 MG tablet Take 750 mg by mouth 2 (two) times daily.   No facility-administered encounter medications on file as of 10/22/2021.    Surgical History: Past Surgical History:  Procedure Laterality Date   ANTERIOR CERVICAL DECOMP/DISCECTOMY FUSION N/A 07/18/2015   Procedure: CERVICALFIVE-SIX, CERVICAL SIX-SEVEN ANTERIOR CERVICAL DECOMPRESSION/DISCECTOMY FUSION;  Surgeon: Tressie Stalker, MD;  Location: [facility];  Service: Neurosurgery;  Laterality: N/A;  C56 C67 anterior cervical decompression with fusion interbody prosthesis plating and bonegraft   COLONOSCOPY     COLONOSCOPY WITH PROPOFOL N/A 08/03/2019   Procedure: COLONOSCOPY WITH PROPOFOL;  Surgeon: Toledo, Boykin Nearing, MD;  Location: [facility];  Service: Gastroenterology;  Laterality: N/A;   ESOPHAGOGASTRODUODENOSCOPY (EGD) WITH PROPOFOL N/A 08/03/2019   Procedure: ESOPHAGOGASTRODUODENOSCOPY (EGD) WITH PROPOFOL;  Surgeon:  Toledo, Boykin Nearing, MD;  Location: [facility];  Service: Gastroenterology;  Laterality: N/A;   leg stint     nov 2021   LOWER EXTREMITY ANGIOGRAPHY Left 02/07/2020   Procedure: LOWER EXTREMITY ANGIOGRAPHY;  Surgeon: Renford Dills, MD;  Location: [facility];  Service: Cardiovascular;  Laterality: Left;   RECTAL POLYPECTOMY  4-5 yrs ago    Medical History: Past Medical History:   Diagnosis Date   Arthritis    DDD- lumbar , thumbs - both hands    COPD (chronic obstructive pulmonary disease) (HCC)    Depression    Diabetes mellitus without complication (HCC)    GERD (gastroesophageal reflux disease)    History of kidney stones 1990's    lithotripsy done    Hypertension    Sleep apnea 01/30/2016   [facility] in [place], results not avail to pt. yet    Family History: Family History  Problem Relation Age of Onset   Diabetes Mother    Asthma Mother    Hypertension Mother    Hyperlipidemia Mother    Hyperlipidemia Father    Dementia Father    Hypertension Father    Heart Problems Father        pace maker    Social History   Socioeconomic History   Marital status: Married    Spouse name: Not on file   Number of children: Not on file   Years of education: Not on file   Highest education level: Not on file  Occupational History   Not on file  Tobacco Use   Smoking status: Every Day    Types: E-cigarettes    Last attempt to quit: 05/2016    Years since quitting: 5.4   Smokeless tobacco: Never   Tobacco comments:    Pt uses a vape 10/22/21  Vaping Use   Vaping Use: Every day   Substances: Nicotine  Substance and Sexual Activity   Alcohol use: Yes    Alcohol/week: 3.0 standard drinks of alcohol    Types: 3 Cans of beer per week    Comment: daily    Drug use: No   Sexual activity: Not on file  Other Topics Concern   Not on file  Social History Narrative   Not on file   Social Determinants of Health   Financial Resource Strain: Not on file  Food Insecurity: Not on file  Transportation Needs: Not on file  Physical Activity: Not on file  Stress: Not on file  Social Connections: Not on file  Intimate Partner Violence: Not on file      Review of Systems  Constitutional:  Negative for chills, fatigue and unexpected weight change.  HENT:  Negative for congestion, rhinorrhea, sneezing and sore throat.   Eyes:  Negative for  redness.  Respiratory: Negative.  Negative for cough, chest tightness, shortness of breath and wheezing.   Cardiovascular: Negative.  Negative for chest pain and palpitations.  Gastrointestinal:  Negative for abdominal pain, constipation, diarrhea, nausea and vomiting.  Genitourinary:  Negative for dysuria and frequency.  Musculoskeletal:  Negative for arthralgias, back pain, joint swelling and neck pain.  Skin:  Negative for rash.  Neurological: Negative.  Negative for tremors and numbness.  Hematological:  Negative for adenopathy. Does not bruise/bleed easily.  Psychiatric/Behavioral:  Negative for behavioral problems (Depression), sleep disturbance and suicidal ideas. The patient is not nervous/anxious.     Vital Signs: BP 120/60   Pulse 60   Temp 98.4 F (36.9 C)  Resp 16   Ht 5\' 6"  (1.676 m)   Wt 216 lb 6.4 oz (98.2 kg)   SpO2 95%   BMI 34.93 kg/m    Physical Exam Vitals reviewed.  Constitutional:      General: He is not in acute distress.    Appearance: Normal appearance. He is obese. He is not ill-appearing.  HENT:     Head: Normocephalic and atraumatic.  Eyes:     Pupils: Pupils are equal, round, and reactive to light.  Cardiovascular:     Rate and Rhythm: Normal rate and regular rhythm.     Heart sounds: Normal heart sounds. No murmur heard. Pulmonary:     Effort: Pulmonary effort is normal. No respiratory distress.     Breath sounds: Normal breath sounds. No wheezing, rhonchi or rales.  Neurological:     Mental Status: He is alert.  Psychiatric:        Mood and Affect: Mood normal.        Assessment/Plan: 1. Obstructive chronic bronchitis without exacerbation (HCC) Stable, continue trelegy inhaler as prescribed. Refills ordered. Follow up in 6 months - Fluticasone-Umeclidin-Vilant (TRELEGY ELLIPTA) 100-62.5-25 MCG/ACT AEPB; Inhale 1 puff into the lungs daily.  Dispense: 60 each; Refill: 11  2. SOB (shortness of breath) Stable, only slight decrease in  FEV1 and FVC.  - Spirometry with Graph    General Counseling: Manraj verbalizes understanding of the findings of todays visit and agrees with plan of treatment. I have discussed any further diagnostic evaluation that may be needed or ordered today. We also reviewed his medications today. he has been encouraged to call the office with any questions or concerns that should arise related to todays visit.    Orders Placed This Encounter  Procedures   Spirometry with Graph    Meds ordered this encounter  Medications   Fluticasone-Umeclidin-Vilant (TRELEGY ELLIPTA) 100-62.5-25 MCG/ACT AEPB    Sig: Inhale 1 puff into the lungs daily.    Dispense:  60 each    Refill:  11    Return in about 6 months (around 04/24/2022) for F/U, pulmonary only with DSK or Karalee Hauter.   Total time spent:30 Minutes Time spent includes review of chart, medications, test results, and follow up plan with the patient.   [place] Controlled Substance Database was reviewed by me.  This patient was seen by 04/26/2022, FNP-C in collaboration with Dr. Sallyanne Kuster as a part of collaborative care agreement.   Denay Pleitez R. Beverely Risen, MSN, FNP-C Internal medicine

## 2021-10-23 ENCOUNTER — Encounter: Payer: Self-pay | Admitting: Nurse Practitioner

## 2022-01-27 ENCOUNTER — Encounter (INDEPENDENT_AMBULATORY_CARE_PROVIDER_SITE_OTHER): Payer: Self-pay

## 2022-04-16 DIAGNOSIS — E039 Hypothyroidism, unspecified: Secondary | ICD-10-CM | POA: Insufficient documentation

## 2022-04-22 ENCOUNTER — Ambulatory Visit: Payer: Medicare HMO | Admitting: Nurse Practitioner

## 2022-05-06 ENCOUNTER — Encounter: Payer: Self-pay | Admitting: Nurse Practitioner

## 2022-05-06 ENCOUNTER — Ambulatory Visit: Payer: Medicare HMO | Admitting: Nurse Practitioner

## 2022-05-06 VITALS — BP 114/60 | HR 64 | Temp 97.1°F | Resp 16 | Ht 66.0 in | Wt 214.0 lb

## 2022-05-06 DIAGNOSIS — J4489 Other specified chronic obstructive pulmonary disease: Secondary | ICD-10-CM | POA: Diagnosis not present

## 2022-05-06 DIAGNOSIS — Z87891 Personal history of nicotine dependence: Secondary | ICD-10-CM | POA: Diagnosis not present

## 2022-05-06 MED ORDER — ALBUTEROL SULFATE HFA 108 (90 BASE) MCG/ACT IN AERS: 2.0000 | INHALATION_SPRAY | Freq: Four times a day (QID) | RESPIRATORY_TRACT | 2 refills | Status: AC | PRN

## 2022-05-06 NOTE — Progress Notes (Signed)
[facility] [place], [place] [address]  Internal MEDICINE  Office Visit Note  Patient Name: Darin Waters  [id number]  [id number]  Date of Service: 05/06/2022  Chief Complaint  Patient presents with   Follow-up    HPI Darin Waters presents for a follow-up visit for COPD Has a CT chest on Thursday at [facility] Using trelegy inhaler which helps  Albuterol inhaler prn for SOB and wheezing Cannot do spiro today.  Overdue for PFT     Current Medication: Outpatient Encounter Medications as of 05/06/2022  Medication Sig   albuterol (VENTOLIN HFA) 108 (90 Base) MCG/ACT inhaler Inhale 2 puffs into the lungs every 6 (six) hours as needed for wheezing or shortness of breath.   amLODipine-benazepril (LOTREL) 10-40 MG capsule Take 1 capsule by mouth daily before breakfast.    atorvastatin (LIPITOR) 40 MG tablet Take 40 mg by mouth daily.    citalopram (CELEXA) 10 MG tablet Take 10 mg by mouth daily before breakfast.    esomeprazole (NEXIUM) 20 MG capsule Take 20 mg by mouth daily before breakfast.   fluticasone (FLONASE) 50 MCG/ACT nasal spray Place 2 sprays into both nostrils daily.   Fluticasone-Umeclidin-Vilant (TRELEGY ELLIPTA) 100-62.5-25 MCG/ACT AEPB Inhale 1 puff into the lungs daily.   furosemide (LASIX) 20 MG tablet Take 20 mg by mouth daily.   gabapentin (NEURONTIN) 300 MG capsule Take 300 mg by mouth 2 (two) times daily.    glimepiride (AMARYL) 2 MG tablet Take 2 mg by mouth in the morning and at bedtime.    HYDROcodone-acetaminophen (NORCO) 10-325 MG tablet Take 2 tablets by mouth in the morning and at bedtime.    insulin glargine (LANTUS) 100 UNIT/ML Solostar Pen Inject 65 Units into the skin once a week.   metFORMIN (GLUCOPHAGE) 500 MG tablet Take 1,000 mg by mouth 2 (two) times daily with a meal.    Multiple Vitamins-Minerals (MULTIVITAMIN WITH MINERALS) tablet Take 1 tablet by mouth daily.   nabumetone (RELAFEN) 750 MG tablet Take 2 tablets by mouth  daily.   ONETOUCH VERIO test strip SMARTSIG:Via Meter   OZEMPIC, 0.25 OR 0.5 MG/DOSE, 2 MG/3ML SOPN Inject 0.5 mg into the skin once a week.   quiNINE (QUALAQUIN) 324 MG capsule Take 1 capsule by mouth daily.    spironolactone (ALDACTONE) 50 MG tablet Take 50 mg by mouth daily.   [DISCONTINUED] PROAIR HFA 108 (90 Base) MCG/ACT inhaler Take 2 puffs by mouth every 6 (six) hours as needed for wheezing or shortness of breath.    No facility-administered encounter medications on file as of 05/06/2022.    Surgical History: Past Surgical History:  Procedure Laterality Date   ANTERIOR CERVICAL DECOMP/DISCECTOMY FUSION N/A 07/18/2015   Procedure: CERVICALFIVE-SIX, CERVICAL SIX-SEVEN ANTERIOR CERVICAL DECOMPRESSION/DISCECTOMY FUSION;  Surgeon: Newman Pies, MD;  Location: [place] [facility];  Service: Neurosurgery;  Laterality: N/A;  C56 C67 anterior cervical decompression with fusion interbody prosthesis plating and bonegraft   COLONOSCOPY     COLONOSCOPY WITH PROPOFOL N/A 08/03/2019   Procedure: COLONOSCOPY WITH PROPOFOL;  Surgeon: Toledo, [name], MD;  Location: [facility] ENDOSCOPY;  Service: Gastroenterology;  Laterality: N/A;   ESOPHAGOGASTRODUODENOSCOPY (EGD) WITH PROPOFOL N/A 08/03/2019   Procedure: ESOPHAGOGASTRODUODENOSCOPY (EGD) WITH PROPOFOL;  Surgeon: Toledo, [name], MD;  Location: [facility] ENDOSCOPY;  Service: Gastroenterology;  Laterality: N/A;   leg stint     nov 2021   LOWER EXTREMITY ANGIOGRAPHY Left 02/07/2020   Procedure: LOWER EXTREMITY ANGIOGRAPHY;  Surgeon: Katha Cabal, MD;  Location:  [place] [facility];  Service: Cardiovascular;  Laterality: Left;   RECTAL POLYPECTOMY  4-5 yrs ago    Medical History: Past Medical History:  Diagnosis Date   Arthritis    DDD- lumbar , thumbs - both hands    COPD (chronic obstructive pulmonary disease) ([place])    Depression    Diabetes mellitus without complication (HCC)    GERD (gastroesophageal reflux disease)    History of kidney stones  1990's    lithotripsy done    Hypertension    Sleep apnea 01/30/2016   [facility] [place], results not avail to pt. yet    Family History: Family History  Problem Relation Age of Onset   Diabetes Mother    Asthma Mother    Hypertension Mother    Hyperlipidemia Mother    Hyperlipidemia Father    Dementia Father    Hypertension Father    Heart Problems Father        pace maker    Social History   Socioeconomic History   Marital status: Married    Spouse name: Not on file   Number of children: Not on file   Years of education: Not on file   Highest education level: Not on file  Occupational History   Not on file  Tobacco Use   Smoking status: Every Day    Types: E-cigarettes    Last attempt to quit: 05/2016    Years since quitting: 6.0   Smokeless tobacco: Never   Tobacco comments:    Pt uses a vape 10/22/21  Vaping Use   Vaping Use: Every day   Substances: Nicotine  Substance and Sexual Activity   Alcohol use: Yes    Alcohol/week: 3.0 standard drinks of alcohol    Types: 3 Cans of beer per week    Comment: daily    Drug use: No   Sexual activity: Not on file  Other Topics Concern   Not on file  Social History Narrative   Not on file   Social Determinants of Health   Financial Resource Strain: Not on file  Food Insecurity: Not on file  Transportation Needs: Not on file  Physical Activity: Not on file  Stress: Not on file  Social Connections: Not on file  Intimate Partner Violence: Not on file      Review of Systems  Constitutional:  Negative for chills, fatigue and unexpected weight change.  HENT:  Negative for congestion, rhinorrhea, sneezing and sore throat.   Eyes:  Negative for redness.  Respiratory: Negative.  Negative for cough, chest tightness, shortness of breath and wheezing.   Cardiovascular: Negative.  Negative for chest pain and palpitations.  Gastrointestinal:  Negative for abdominal pain, constipation, diarrhea, nausea and  vomiting.  Genitourinary:  Negative for dysuria and frequency.  Musculoskeletal:  Negative for arthralgias, back pain, joint swelling and neck pain.  Skin:  Negative for rash.  Neurological: Negative.  Negative for tremors and numbness.  Hematological:  Negative for adenopathy. Does not bruise/bleed easily.  Psychiatric/Behavioral:  Negative for behavioral problems (Depression), sleep disturbance and suicidal ideas. The patient is not nervous/anxious.     Vital Signs: BP 114/60   Pulse 64   Temp (!) 97.1 F (36.2 C)   Resp 16   Ht 5\' 6"  (1.676 m)   Wt 214 lb (97.1 kg)   SpO2 94%   BMI 34.54 kg/m    Physical Exam Vitals reviewed.  Constitutional:      General: He is  not in acute distress.    Appearance: Normal appearance. He is obese. He is not ill-appearing.  HENT:     Head: Normocephalic and atraumatic.  Eyes:     Pupils: Pupils are equal, round, and reactive to light.  Cardiovascular:     Rate and Rhythm: Normal rate and regular rhythm.     Heart sounds: Normal heart sounds. No murmur heard. Pulmonary:     Effort: Pulmonary effort is normal. No respiratory distress.     Breath sounds: Normal breath sounds. No wheezing, rhonchi or rales.  Neurological:     Mental Status: He is alert.  Psychiatric:        Mood and Affect: Mood normal.        Assessment/Plan: 1. Obstructive chronic bronchitis without exacerbation Rescue inhaler refills ordered and PFT ordered. Follow up to discuss PFT results and CT chest results. - albuterol (VENTOLIN HFA) 108 (90 Base) MCG/ACT inhaler; Inhale 2 puffs into the lungs every 6 (six) hours as needed for wheezing or shortness of breath.  Dispense: 8 g; Refill: 2 - Pulmonary function test; Future  2. Personal history of tobacco use, presenting hazards to health Scheduled for CT chest on Thursday this week   General Counseling: Cornellius verbalizes understanding of the findings of todays visit and agrees with plan of treatment. I have  discussed any further diagnostic evaluation that may be needed or ordered today. We also reviewed his medications today. he has been encouraged to call the office with any questions or concerns that should arise related to todays visit.    Orders Placed This Encounter  Procedures   Pulmonary function test    Meds ordered this encounter  Medications   albuterol (VENTOLIN HFA) 108 (90 Base) MCG/ACT inhaler    Sig: Inhale 2 puffs into the lungs every 6 (six) hours as needed for wheezing or shortness of breath.    Dispense:  8 g    Refill:  2    Return for PFT @ [place] then follow up for results after it is done. .   Total time spent:20 Minutes Time spent includes review of chart, medications, test results, and follow up plan with the patient.   [place] Controlled Substance Database was reviewed by me.  This patient was seen by Jonetta Osgood, FNP-C in collaboration with Dr. Clayborn Bigness as a part of collaborative care agreement.   Tripton Ned R. Valetta Fuller, MSN, FNP-C Internal medicine

## 2022-05-08 ENCOUNTER — Ambulatory Visit
Admission: RE | Admit: 2022-05-08 | Discharge: 2022-05-08 | Disposition: A | Discharge status: Home / Self Care | Payer: Medicare HMO | Source: Ambulatory Visit | Attending: Internal Medicine | Admitting: Internal Medicine

## 2022-05-08 DIAGNOSIS — Z87891 Personal history of nicotine dependence: Secondary | ICD-10-CM

## 2022-05-09 ENCOUNTER — Other Ambulatory Visit: Payer: Self-pay | Admitting: Acute Care

## 2022-05-09 DIAGNOSIS — Z87891 Personal history of nicotine dependence: Secondary | ICD-10-CM

## 2022-05-09 DIAGNOSIS — Z122 Encounter for screening for malignant neoplasm of respiratory organs: Secondary | ICD-10-CM

## 2022-05-21 ENCOUNTER — Ambulatory Visit (INDEPENDENT_AMBULATORY_CARE_PROVIDER_SITE_OTHER): Payer: Medicare HMO | Admitting: Internal Medicine

## 2022-05-21 DIAGNOSIS — J4489 Other specified chronic obstructive pulmonary disease: Secondary | ICD-10-CM

## 2022-05-29 ENCOUNTER — Encounter: Payer: Self-pay | Admitting: Pain Medicine

## 2022-05-29 ENCOUNTER — Ambulatory Visit: Payer: Medicare HMO | Attending: Pain Medicine | Admitting: Pain Medicine

## 2022-05-29 VITALS — BP 123/65 | HR 60 | Temp 97.0°F | Resp 18 | Ht 66.0 in | Wt 215.0 lb

## 2022-05-29 DIAGNOSIS — M5441 Lumbago with sciatica, right side: Secondary | ICD-10-CM | POA: Diagnosis present

## 2022-05-29 DIAGNOSIS — M5442 Lumbago with sciatica, left side: Secondary | ICD-10-CM

## 2022-05-29 DIAGNOSIS — S32010S Wedge compression fracture of first lumbar vertebra, sequela: Secondary | ICD-10-CM

## 2022-05-29 DIAGNOSIS — G96198 Other disorders of meninges, not elsewhere classified: Secondary | ICD-10-CM

## 2022-05-29 DIAGNOSIS — M5416 Radiculopathy, lumbar region: Secondary | ICD-10-CM | POA: Diagnosis present

## 2022-05-29 DIAGNOSIS — M51369 Other intervertebral disc degeneration, lumbar region without mention of lumbar back pain or lower extremity pain: Secondary | ICD-10-CM

## 2022-05-29 DIAGNOSIS — M47817 Spondylosis without myelopathy or radiculopathy, lumbosacral region: Secondary | ICD-10-CM

## 2022-05-29 DIAGNOSIS — M961 Postlaminectomy syndrome, not elsewhere classified: Secondary | ICD-10-CM | POA: Diagnosis present

## 2022-05-29 DIAGNOSIS — M47816 Spondylosis without myelopathy or radiculopathy, lumbar region: Secondary | ICD-10-CM

## 2022-05-29 DIAGNOSIS — M5136 Other intervertebral disc degeneration, lumbar region: Secondary | ICD-10-CM | POA: Diagnosis present

## 2022-05-29 DIAGNOSIS — R937 Abnormal findings on diagnostic imaging of other parts of musculoskeletal system: Secondary | ICD-10-CM

## 2022-05-29 DIAGNOSIS — M79605 Pain in left leg: Secondary | ICD-10-CM | POA: Diagnosis present

## 2022-05-29 DIAGNOSIS — G8929 Other chronic pain: Secondary | ICD-10-CM | POA: Diagnosis present

## 2022-05-29 DIAGNOSIS — Z7901 Long term (current) use of anticoagulants: Secondary | ICD-10-CM

## 2022-05-29 DIAGNOSIS — M79604 Pain in right leg: Secondary | ICD-10-CM

## 2022-05-29 DIAGNOSIS — M545 Low back pain, unspecified: Secondary | ICD-10-CM | POA: Diagnosis present

## 2022-05-29 NOTE — Progress Notes (Signed)
Safety precautions to be maintained throughout the outpatient stay will include: orient to surroundings, keep bed in low position, maintain call bell within reach at all times, provide assistance with transfer out of bed and ambulation.  

## 2022-05-29 NOTE — Patient Instructions (Signed)
______________________________________________________________________  Procedure instructions  Do not eat or drink fluids (other than water) for 6 hours before your procedure  No water for 2 hours before your procedure  Take your blood pressure medicine with a sip of water  Arrive 30 minutes before your appointment  Carefully read the "Preparing for your procedure" detailed instructions  If you have questions call us at [phone]  _____________________________________________________________________    ______________________________________________________________________  Preparing for your procedure  Appointments: If you think you may not be able to keep your appointment, call 24-48 hours in advance to cancel. We need time to make it available to others.  During your procedure appointment there will be: No Prescription Refills. No disability issues to discussed. No medication changes or discussions.  Instructions: Food intake: Avoid eating anything solid for at least 8 hours prior to your procedure. Clear liquid intake: You may take clear liquids such as water up to 2 hours prior to your procedure. (No carbonated drinks. No soda.) Transportation: Unless otherwise stated by your physician, bring a driver. Morning Medicines: Except for blood thinners, take all of your other morning medications with a sip of water. Make sure to take your heart and blood pressure medicines. If your blood pressure's lower number is above 100, the case will be rescheduled. Blood thinners: Make sure to stop your blood thinners as instructed.  If you take a blood thinner, but were not instructed to stop it, call our office [phone] and ask to talk to a nurse. Not stopping a blood thinner prior to certain procedures could lead to serious complications. Diabetics on insulin: Notify the staff so that you can be scheduled 1st case in the morning. If your diabetes requires high dose insulin,  take only  of your normal insulin dose the morning of the procedure and notify the staff that you have done so. Preventing infections: Shower with an antibacterial soap the morning of your procedure.  Build-up your immune system: Take 1000 mg of Vitamin C with every meal (3 times a day) the day prior to your procedure. Antibiotics: Inform the nursing staff if you are taking any antibiotics or if you have any conditions that may require antibiotics prior to procedures. (Example: recent joint implants)   Pregnancy: If you are pregnant make sure to notify the nursing staff. Not doing so may result in injury to the fetus, including death.  Sickness: If you have a cold, fever, or any active infections, call and cancel or reschedule your procedure. Receiving steroids while having an infection may result in complications. Arrival: You must be in the facility at least 30 minutes prior to your scheduled procedure. Tardiness: Your scheduled time is also the cutoff time. If you do not arrive at least 15 minutes prior to your procedure, you will be rescheduled.  Children: Do not bring any children with you. Make arrangements to keep them home. Dress appropriately: There is always a possibility that your clothing may get soiled. Avoid long dresses. Valuables: Do not bring any jewelry or valuables.  Reasons to call and reschedule or cancel your procedure: (Following these recommendations will minimize the risk of a serious complication.) Surgeries: Avoid having procedures within 2 weeks of any surgery. (Avoid for 2 weeks before or after any surgery). Flu Shots: Avoid having procedures within 2 weeks of a flu shots or . (Avoid for 2 weeks before or after immunizations). Barium: Avoid having a procedure within 7-10 days after having had a radiological study involving the use of  radiological contrast. (Myelograms, Barium swallow or enema study). Heart attacks: Avoid any elective procedures or surgeries for the  initial 6 months after a "Myocardial Infarction" (Heart Attack). Blood thinners: It is imperative that you stop these medications before procedures. Let us know if you if you take any blood thinner.  Infection: Avoid procedures during or within two weeks of an infection (including chest colds or gastrointestinal problems). Symptoms associated with infections include: Localized redness, fever, chills, night sweats or profuse sweating, burning sensation when voiding, cough, congestion, stuffiness, runny nose, sore throat, diarrhea, nausea, vomiting, cold or Flu symptoms, recent or current infections. It is specially important if the infection is over the area that we intend to treat. Heart and lung problems: Symptoms that may suggest an active cardiopulmonary problem include: cough, chest pain, breathing difficulties or shortness of breath, dizziness, ankle swelling, uncontrolled high or unusually low blood pressure, and/or palpitations. If you are experiencing any of these symptoms, cancel your procedure and contact your primary care physician for an evaluation.  Remember:  Regular Business hours are:  Monday to Thursday 8:00 AM to 4:00 PM  Provider's Schedule: Milinda Pointer, MD:  Procedure days: Tuesday and Thursday 7:30 AM to 4:00 PM  Gillis Santa, MD:  Procedure days: Monday and Wednesday 7:30 AM to 4:00 PM  ______________________________________________________________________    ____________________________________________________________________________________________  General Risks and Possible Complications  Patient Responsibilities: It is important that you read this as it is part of your informed consent. It is our duty to inform you of the risks and possible complications associated with treatments offered to you. It is your responsibility as a patient to read this and to ask questions about anything that is not clear or that you believe was not covered in this  document.  Patient's Rights: You have the right to refuse treatment. You also have the right to change your mind, even after initially having agreed to have the treatment done. However, under this last option, if you wait until the last second to change your mind, you may be charged for the materials used up to that point.  Introduction: Medicine is not an Chief Strategy Officer. Everything in Medicine, including the lack of treatment(s), carries the potential for danger, harm, or loss (which is by definition: Risk). In Medicine, a complication is a secondary problem, condition, or disease that can aggravate an already existing one. All treatments carry the risk of possible complications. The fact that a side effects or complications occurs, does not imply that the treatment was conducted incorrectly. It must be clearly understood that these can happen even when everything is done following the highest safety standards.  No treatment: You can choose not to proceed with the proposed treatment alternative. The "PRO(s)" would include: avoiding the risk of complications associated with the therapy. The "CON(s)" would include: not getting any of the treatment benefits. These benefits fall under one of three categories: diagnostic; therapeutic; and/or palliative. Diagnostic benefits include: getting information which can ultimately lead to improvement of the disease or symptom(s). Therapeutic benefits are those associated with the successful treatment of the disease. Finally, palliative benefits are those related to the decrease of the primary symptoms, without necessarily curing the condition (example: decreasing the pain from a flare-up of a chronic condition, such as incurable terminal cancer).  General Risks and Complications: These are associated to most interventional treatments. They can occur alone, or in combination. They fall under one of the following six (6) categories: no benefit or worsening of symptoms;  bleeding; infection; nerve damage; allergic reactions; and/or death. No benefits or worsening of symptoms: In Medicine there are no guarantees, only probabilities. No healthcare provider can ever guarantee that a medical treatment will work, they can only state the probability that it may. Furthermore, there is always the possibility that the condition may worsen, either directly, or indirectly, as a consequence of the treatment. Bleeding: This is more common if the patient is taking a blood thinner, either prescription or over the counter (example: Goody Powders, Fish oil, Aspirin, Garlic, etc.), or if suffering a condition associated with impaired coagulation (example: Hemophilia, cirrhosis of the liver, low platelet counts, etc.). However, even if you do not have one on these, it can still happen. If you have any of these conditions, or take one of these drugs, make sure to notify your treating physician. Infection: This is more common in patients with a compromised immune system, either due to disease (example: diabetes, cancer, human immunodeficiency virus [HIV], etc.), or due to medications or treatments (example: therapies used to treat cancer and rheumatological diseases). However, even if you do not have one on these, it can still happen. If you have any of these conditions, or take one of these drugs, make sure to notify your treating physician. Nerve Damage: This is more common when the treatment is an invasive one, but it can also happen with the use of medications, such as those used in the treatment of cancer. The damage can occur to small secondary nerves, or to large primary ones, such as those in the spinal cord and brain. This damage may be temporary or permanent and it may lead to impairments that can range from temporary numbness to permanent paralysis and/or brain death. Allergic Reactions: Any time a substance or material comes in contact with our body, there is the possibility of an  allergic reaction. These can range from a mild skin rash (contact dermatitis) to a severe systemic reaction (anaphylactic reaction), which can result in death. Death: In general, any medical intervention can result in death, most of the time due to an unforeseen complication. ____________________________________________________________________________________________    ____________________________________________________________________________________________  Blood Thinners  IMPORTANT NOTICE:  If you take any of these, make sure to notify the nursing staff.  Failure to do so may result in injury.  Recommended time intervals to stop and restart blood-thinners, before & after invasive procedures  Generic Name Brand Name Pre-procedure. Stop this long before procedure. Post-procedure. Minimum waiting period before restarting.  Abciximab Reopro 15 days 2 hrs  Alteplase Activase 10 days 10 days  Anagrelide Agrylin    Apixaban Eliquis 3 days 6 hrs  Cilostazol Pletal 3 days 5 hrs  Clopidogrel Plavix 7-10 days 2 hrs  Dabigatran Pradaxa 5 days 6 hrs  Dalteparin Fragmin 24 hours 4 hrs  Dipyridamole Aggrenox 11days 2 hrs  Edoxaban Lixiana; Savaysa 3 days 2 hrs  Enoxaparin  Lovenox 24 hours 4 hrs  Eptifibatide Integrillin 8 hours 2 hrs  Fondaparinux  Arixtra 72 hours 12 hrs  Hydroxychloroquine Plaquenil 11 days   Prasugrel Effient 7-10 days 6 hrs  Reteplase Retavase 10 days 10 days  Rivaroxaban Xarelto 3 days 6 hrs  Ticagrelor Brilinta 5-7 days 6 hrs  Ticlopidine Ticlid 10-14 days 2 hrs  Tinzaparin Innohep 24 hours 4 hrs  Tirofiban Aggrastat 8 hours 2 hrs  Warfarin Coumadin 5 days 2 hrs   Other medications with blood-thinning effects  Product indications Generic (Brand) names Note  Cholesterol Lipitor Stop 4 days before procedure  Blood thinner (injectable) Heparin (LMW or LMWH Heparin) Stop 24 hours before procedure  Cancer Ibrutinib (Imbruvica) Stop 7 days before procedure   Malaria/Rheumatoid Hydroxychloroquine (Plaquenil) Stop 11 days before procedure  Thrombolytics  10 days before or after procedures   Over-the-counter (OTC) Products with blood-thinning effects  Product Common names Stop Time  Aspirin > 325 mg Goody Powders, Excedrin, etc. 11 days  Aspirin ? 81 mg  7 days  Fish oil  4 days  Garlic supplements  7 days  Ginkgo biloba  36 hours  Ginseng  24 hours  NSAIDs Ibuprofen, Naprosyn, etc. 3 days  Vitamin E  4 days   ____________________________________________________________________________________________

## 2022-05-29 NOTE — Progress Notes (Signed)
PROVIDER NOTE: Information contained herein reflects review and annotations entered in association with encounter. Interpretation of such information and data should be left to medically-trained personnel. Information provided to patient can be located elsewhere in the medical record under "Patient Instructions". Document created using STT-dictation technology, any transcriptional errors that may result from process are unintentional.    Patient: Darin Waters  Service Category: E/M  Provider: Gaspar Cola, MD  DOB: Apr 19, 1960  DOS: 05/29/2022  Referring Provider: Sallee Lange, *  MRN: [id number]  Specialty: Interventional Pain Management  PCP: Sallee Lange, NP  Type: Established Patient  Setting: Ambulatory outpatient    Location: Office  Delivery: Face-to-face     HPI  [name], a 62 y.o. year old male, is here today because of his Chronic bilateral low back pain with bilateral sciatica [M54.42, M54.41, G89.29]. [name] primary complain today is Back Pain (low) Last encounter: My last encounter with him was on Visit date not found. Pertinent problems: [name] has Cervical herniated disc; Spondylolisthesis of lumbar region; Chronic pain syndrome; DDD (degenerative disc disease), lumbar; Lumbar radiculitis (S1 dermatome) (Bilateral) (L>R); Chronic low back pain (2ry area of Pain) (Bilateral) (L>R) w/ sciatica (Bilateral); Failed back surgical syndrome; Chronic lower extremity pain (1ry area of Pain) (Bilateral) (L>R); PAD (peripheral artery disease) ([place]); Lumbar facet syndrome (Left); Chronic sacroiliac joint pain (Left); Chronic low back pain (Left) w/o sciatica; Spondylosis without myelopathy or radiculopathy, lumbosacral region; 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 Pain Assessment: Severity of Chronic pain is reported as a 7 /10. Location: Back Lower/radiates down left leg to knee in the back. Onset: More than a month ago. Quality: Aching. Timing: Intermittent. Modifying factor(s): rest. Vitals:  height is '5\' 6"'$  (1.676 m) and weight is 215 lb (97.5 kg). His temperature is 97 F (36.1 C) (abnormal). His blood pressure is 123/65 and his pulse is 60. His respiration is 18 and oxygen saturation is 98%.  BMI: Estimated body mass index is 34.7 kg/m as calculated from the following:   Height as of this encounter: '5\' 6"'$  (1.676 m).   Weight as of this encounter: 215 lb (97.5 kg).  Reason for encounter: evaluation of worsening, or previously known (established) problem.  The patient's last encounter was on 10/30/2020 for a postprocedure evaluation after having had a diagnostic left lumbar facet block #2.  At that time the patient indicated having a 100% ongoing relief of his left-sided low back pain.  Today he returns again indicating a return of this low back pain.  He does have a prior history of lumbar fusion with lumbar hardware.  Since his last visit, he refers that he was taken off of the Plavix and he no longer takes any blood thinners.  Physical exam: The patient was able to toe walk and heel walk without any problems.  Lumbar flexion was relatively within normal limits.  He indicated not having any significant pain on flexion.  Hyperextension maneuver of the lumbar spine shows decreased range of motion with pain on rotation.  This pain seems to be primarily on the left lower back.  Kemp maneuver was positive on the left for lumbar facet  arthralgia.  Provocative Patrick maneuver was negative bilaterally.  Straight leg  raise was also negative bilaterally.  Imaging: The patient has documented evidence of lumbar DDD with hardware in the lumbosacral region.  He indicates having had some recent x-rays of his lower back, but these are not available within the [facility] system or through "care everywhere".  He indicates that he can actually see them in "my chart".  I have suggested that he bring those reports on his next visit.  Based on the above information, I have scheduled the patient for a repeat left-sided lumbar facet medial branch block #3 under fluoroscopic guidance.  Pharmacotherapy Assessment  Analgesic: No chronic opioid analgesics therapy prescribed by our practice. Hydrocodone/APAP 10/325 1 tablet p.o. twice daily (20 mg/day of hydrocodone) (20 MME/day)  MME/day: 20 mg/day   Monitoring: [place] PMP: PDMP not reviewed this encounter.       Pharmacotherapy: No side-effects or adverse reactions reported. Compliance: No problems identified. Effectiveness: Clinically acceptable.  Dewayne Shorter, RN  05/29/2022 12:44 PM  Sign when Signing Visit Safety precautions to be maintained throughout the outpatient stay will include: orient to surroundings, keep bed in low position, maintain call bell within reach at all times, provide assistance with transfer out of bed and ambulation.    No results found for: "CBDTHCR" No results found for: "D8THCCBX" No results found for: "D9THCCBX"  UDS:  Summary  Date Value Ref Range Status  10/10/2019 Note  Final    Comment:    ==================================================================== Compliance Drug Analysis, Ur ==================================================================== Test                             Result       Flag       Units  Drug Present and Declared for Prescription Verification   Gabapentin                     PRESENT      EXPECTED   Citalopram                     PRESENT      EXPECTED   Desmethylcitalopram            PRESENT      EXPECTED     Desmethylcitalopram is an expected metabolite of citalopram or the    enantiomeric form, escitalopram.    Acetaminophen                  PRESENT      EXPECTED  Drug Present not Declared for Prescription Verification   Hydrocodone                    2963         UNEXPECTED ng/mg creat   Hydromorphone                  135          UNEXPECTED ng/mg creat   Dihydrocodeine                 393          UNEXPECTED ng/mg creat   Norhydrocodone                 1000         UNEXPECTED ng/mg creat    Sources of hydrocodone include scheduled prescription medications.    Hydromorphone, dihydrocodeine and norhydrocodone are  expected    metabolites of hydrocodone. Hydromorphone and dihydrocodeine are    also available as scheduled prescription medications.    Butorphanol                    PRESENT      UNEXPECTED  Drug Absent but Declared for Prescription Verification   Oxycodone                      Not Detected UNEXPECTED ng/mg creat   Cyclobenzaprine                Not Detected UNEXPECTED   Salicylate                     Not Detected UNEXPECTED    Aspirin, as indicated in the declared medication list, is not always    detected even when used as directed.  ==================================================================== Test                      Result    Flag   Units      Ref Range   Creatinine              185              mg/dL      >=20 ==================================================================== Declared Medications:  The flagging and interpretation on this report are based on the  following declared medications.  Unexpected results may arise from  inaccuracies in the declared medications.   **Note: The testing scope of this panel includes these medications:   Citalopram (Celexa)  Cyclobenzaprine (Flexeril)  Gabapentin (Neurontin)  Oxycodone (Percocet)   **Note: The testing scope of this panel does not include small to  moderate amounts of these reported medications:    Acetaminophen (Percocet)  Aspirin   **Note: The testing scope of this panel does not include the  following reported medications:   Albuterol (Proair HFA)  Amlodipine (Lotrel)  Benazepril (Lotrel)  Budesonide (Symbicort)  Docusate (Colace)  Esomeprazole (Nexium)  Fluticasone (Trelegy)  Formoterol (Symbicort)  Furosemide (Lasix)  Glimepiride (Amaryl)  Insulin  Metformin (Glucophage)  Multivitamin  Quinine (Qualaquin)  Simvastatin (Zocor)  Umeclidinium (Trelegy)  Vilanterol (Trelegy) ==================================================================== For clinical consultation, please call [phone]. ====================================================================       ROS  Constitutional: Denies any fever or chills Gastrointestinal: No reported hemesis, hematochezia, vomiting, or acute GI distress Musculoskeletal: Denies any acute onset joint swelling, redness, loss of ROM, or weakness Neurological: No reported episodes of acute onset apraxia, aphasia, dysarthria, agnosia, amnesia, paralysis, loss of coordination, or loss of consciousness  Medication Review  Fluticasone-Umeclidin-Vilant, HYDROcodone-acetaminophen, Semaglutide(0.25 or 0.'5MG'$ /DOS), albuterol, amLODipine-benazepril, atorvastatin, citalopram, esomeprazole, fluticasone, furosemide, gabapentin, glimepiride, glucose blood, insulin glargine, metFORMIN, multivitamin with minerals, nabumetone, quiNINE, and spironolactone  History Review  Allergy: [name] has No Known Allergies. Drug: [name]  reports no history of drug use. Alcohol:  reports current alcohol use of about 3.0 standard drinks of alcohol per week. Tobacco:  reports that he has been smoking e-cigarettes. He has never used smokeless tobacco. Social: [name]  reports that he has been smoking e-cigarettes. He has never used smokeless tobacco. He reports current alcohol use of about 3.0 standard drinks of alcohol per week. He reports that  he does not use drugs. Medical:  has a past medical history of Arthritis, COPD (chronic obstructive pulmonary disease) ([place]), Depression, Diabetes mellitus without complication ([place]), GERD (gastroesophageal reflux disease), History of kidney  stones (1990's ), Hypertension, and Sleep apnea (01/30/2016). Surgical: [name]  has a past surgical history that includes Colonoscopy; Rectal polypectomy (4-5 yrs ago); Anterior cervical decomp/discectomy fusion (N/A, 07/18/2015); Esophagogastroduodenoscopy (egd) with propofol (N/A, 08/03/2019); Colonoscopy with propofol (N/A, 08/03/2019); Lower Extremity Angiography (Left, 02/07/2020); and leg stint. Family: family history includes Asthma in his mother; Dementia in his father; Diabetes in his mother; Heart Problems in his father; Hyperlipidemia in his father and mother; Hypertension in his father and mother.  Laboratory Chemistry Profile   Renal Lab Results  Component Value Date   BUN 21 02/07/2020   CREATININE 1.23 02/07/2020   BCR 14 10/10/2019   GFRAA 61 10/10/2019   GFRNONAA >60 02/07/2020    Hepatic Lab Results  Component Value Date   AST 30 10/10/2019   ALBUMIN 4.6 10/10/2019   ALKPHOS 59 10/10/2019    Electrolytes Lab Results  Component Value Date   NA 136 10/10/2019   K 5.7 (H) 10/10/2019   CL 100 10/10/2019   CALCIUM 9.8 10/10/2019   MG 2.0 10/10/2019    Bone Lab Results  Component Value Date   25OHVITD1 33 10/10/2019   25OHVITD2 <1.0 10/10/2019   25OHVITD3 33 10/10/2019    Inflammation (CRP: Acute Phase) (ESR: Chronic Phase) Lab Results  Component Value Date   CRP 2 10/10/2019   ESRSEDRATE 16 10/10/2019         Note: Above Lab results reviewed.  Recent Imaging Review  CT CHEST LUNG CA SCREEN LOW DOSE W/O CM CLINICAL DATA:  95 pack-year smoking history/quit 5 years ago  EXAM: CT CHEST WITHOUT CONTRAST LOW-DOSE FOR LUNG CANCER SCREENING  TECHNIQUE: Multidetector CT imaging of the chest was performed following  the standard protocol without IV contrast.  RADIATION DOSE REDUCTION: This exam was performed according to the departmental dose-optimization program which includes automated exposure control, adjustment of the mA and/or kV according to patient size and/or use of iterative reconstruction technique.  COMPARISON:  05/08/2021  FINDINGS: Cardiovascular: Aortic atherosclerosis. Tortuous thoracic aorta. Borderline cardiomegaly. Three vessel coronary artery calcification.  Mediastinum/Nodes: No mediastinal or hilar adenopathy, given limitations of unenhanced CT.  Lungs/Pleura: No pleural fluid. Mild centrilobular emphysema. Clear lungs.  Upper Abdomen: Normal imaged portions of the liver, spleen, stomach, pancreas, adrenal glands.  Musculoskeletal: Mild bilateral gynecomastia. Cervical spine fixation. Mild T9 compression deformity is similar without ventral canal encroachment.  IMPRESSION: 1. Lung-RADS 1, negative. Continue annual screening with low-dose chest CT without contrast in 12 months. 2. Mild bilateral gynecomastia. 3. Aortic Atherosclerosis (ICD10-I70.0) and Emphysema (ICD10-J43.9). Coronary artery atherosclerosis.  Electronically Signed   By: Abigail Miyamoto M.D.   On: 05/08/2022 12:55 Note: Reviewed        Physical Exam  General appearance: Well nourished, well developed, and well hydrated. In no apparent acute distress Mental status: Alert, oriented x 3 (person, place, & time)       Respiratory: No evidence of acute respiratory distress Eyes: PERLA Vitals: BP 123/65   Pulse 60   Temp (!) 97 F (36.1 C)   Resp 18   Ht '5\' 6"'$  (1.676 m)   Wt 215 lb (97.5 kg)   SpO2 98%   BMI 34.70 kg/m  BMI: Estimated body mass index is 34.7 kg/m as calculated from the following:   Height as of this encounter: '5\' 6"'$  (1.676 m).   Weight as of this encounter: 215 lb (97.5 kg). Ideal: Ideal body weight: 63.8 kg (140 lb 10.5 oz) Adjusted ideal body weight: 77.3 kg (170  lb 6.3  oz)  Assessment   Diagnosis Status  1. Chronic low back pain (2ry area of Pain) (Bilateral) (L>R) w/ sciatica (Bilateral)   2. Chronic lower extremity pain (1ry area of Pain) (Bilateral) (L>R)   3. DDD (degenerative disc disease), lumbar   4. Failed back surgical syndrome   5. Epidural fibrosis   6. Abnormal MRI, lumbar spine (09/13/2018)   7. Compression fracture of L1 lumbar vertebra, sequela   8. Lumbar radiculitis (S1 dermatome) (Bilateral) (L>R)   9. Chronic anticoagulation (Plavix)   10. Lumbar facet syndrome (Left)   11. Spondylosis without myelopathy or radiculopathy, lumbosacral region   12. Chronic low back pain (Left) w/o sciatica    Controlled Controlled Controlled   Updated Problems: No problems updated.  Plan of Care  Problem-specific:  No problem-specific Assessment & Plan notes found for this encounter.  [name] has a current medication list which includes the following long-term medication(s): albuterol, amlodipine-benazepril, atorvastatin, citalopram, esomeprazole, fluticasone, furosemide, gabapentin, glimepiride, insulin glargine, metformin, and spironolactone.  Pharmacotherapy (Medications Ordered): No orders of the defined types were placed in this encounter.  Orders:  Orders Placed This Encounter  Procedures   LUMBAR FACET(MEDIAL BRANCH NERVE BLOCK) MBNB    Diagnosis: Lumbar Facet Syndrome (M47.816); Lumbosacral Facet Syndrome (M47.817); Lumbar Facet Joint Pain (M54.59) Medical Necessity Statement: 1.Severe chronic axial low back pain causing functional impairment documented by ongoing pain scale assessments. 2.Pain present for longer than 3 months (Chronic) documented to have failed noninvasive conservative therapies. 3.Absence of untreated radiculopathy. 4.There is no radiological evidence of untreated fractures, tumor, infection, or deformity.  Physical Examination Findings: Positive Kemp Maneuver: (Y)  Positive Lumbar  Hyperextension-Rotation provocative test: (Y)    Standing Status:   Future    Standing Expiration Date:   08/27/2022    Scheduling Instructions:     Procedure: Lumbar facet Medial Branch Block (MBB)     Side: Left-sided     Level: L4-5 and L5-S1 Facets (L3, L4, L5, and S1 Medial Branch)     Sedation: Patient's choice.     Timeframe: ASAP    Order Specific Question:   Where will this procedure be performed?    Answer:   [facility]   Follow-up plan:   Return for (Clinic): (L) L-FCT Blk #3.      Interventional Therapies  Risk  Complexity Considerations:   NOTE: Plavix Anticoagulation (Stop: 7-10 days  Restart: 2 hours) (Discontinued in 2022-23) Prior patient of Dr. Sharlet Salina  Uncontrolled type 2 diabetes with hyperglycemia  Nicotine dependence  tobacco abuse  COPD  SOB on exertion  CAD  hypertension      Planned  Pending:   Therapeutic left lumbar facet medial branch block #3    Under consideration:   Diagnostic left lumbar facet MBB #3  Diagnostic left SI joint block #1  Therapeutic Racz procedure #1  Diagnostic bilateral lumbar spinal cord stimulator trial    Completed:   Diagnostic left lumbar facet block x2 (10/16/2020) (100/100/100/100)  Diagnostic left caudal ESI + epidurogram x2 (01/10/2020) (100/100/1 100 x 2 weeks/50) (90/90/90/100)   Therapeutic  Palliative (PRN) options:   Therapeutic (ML) caudal ESI #3  Diagnostic/therapeutic left lumbar facet block #3       Recent Visits No visits were found meeting these conditions. Showing recent visits within past 90 days and meeting all other requirements Today's Visits Date Type Provider Dept  05/29/22 Office Visit Milinda Pointer, MD [facility]  Showing today's visits and  meeting all other requirements Future Appointments No visits were found meeting these conditions. Showing future appointments within next 90 days and meeting all other requirements  I discussed the assessment and  treatment plan with the patient. The patient was provided an opportunity to ask questions and all were answered. The patient agreed with the plan and demonstrated an understanding of the instructions.  Patient advised to call back or seek an in-person evaluation if the symptoms or condition worsens.  Duration of encounter: 30 minutes.  Total time on encounter, as per AMA guidelines included both the face-to-face and non-face-to-face time personally spent by the physician and/or other qualified health care professional(s) on the day of the encounter (includes time in activities that require the physician or other qualified health care professional and does not include time in activities normally performed by clinical staff). Physician's time may include the following activities when performed: Preparing to see the patient (e.g., pre-charting review of records, searching for previously ordered imaging, lab work, and nerve conduction tests) Review of prior analgesic pharmacotherapies. Reviewing PMP Interpreting ordered tests (e.g., lab work, imaging, nerve conduction tests) Performing post-procedure evaluations, including interpretation of diagnostic procedures Obtaining and/or reviewing separately obtained history Performing a medically appropriate examination and/or evaluation Counseling and educating the patient/family/caregiver Ordering medications, tests, or procedures Referring and communicating with other health care professionals (when not separately reported) Documenting clinical information in the electronic or other health record Independently interpreting results (not separately reported) and communicating results to the patient/ family/caregiver Care coordination (not separately reported)  Note by: Gaspar Cola, MD Date: 05/29/2022; Time: 1:38 PM

## 2022-06-01 NOTE — Procedures (Signed)
[facility] [place], [id number]    Complete Pulmonary Function Testing Interpretation:  FINDINGS:  Forced vital capacity is normal.  FEV1 is normal.  FEV1 FVC ratio is normal.  Postbronchodilator no significant change in the FEV1.  Total lung capacity is mildly decreased.  Residual volume is decreased.  FRC is decreased.  DLCO was attempted but patient is not able to perform directions  IMPRESSION:  Pulmonary function study is suggestive of mild restrictive lung disease.  Clinical correlation is recommended  Allyne Gee, MD [facility] Pulmonary Critical Care Medicine Sleep Medicine

## 2022-06-02 ENCOUNTER — Encounter: Payer: Self-pay | Admitting: Nurse Practitioner

## 2022-06-02 ENCOUNTER — Ambulatory Visit: Payer: Medicare HMO | Admitting: Nurse Practitioner

## 2022-06-02 VITALS — BP 104/57 | HR 70 | Temp 98.2°F | Resp 16 | Ht 66.0 in | Wt 213.2 lb

## 2022-06-02 DIAGNOSIS — R0602 Shortness of breath: Secondary | ICD-10-CM | POA: Diagnosis not present

## 2022-06-02 DIAGNOSIS — Z87891 Personal history of nicotine dependence: Secondary | ICD-10-CM

## 2022-06-02 DIAGNOSIS — J4489 Other specified chronic obstructive pulmonary disease: Secondary | ICD-10-CM | POA: Diagnosis not present

## 2022-06-02 NOTE — Progress Notes (Signed)
[facility] [place], [place] [address]  Internal MEDICINE  Office Visit Note  Patient Name: Darin Waters  [id number]  [id number]  Date of Service: 06/02/2022  Chief Complaint  Patient presents with   Follow-up    F/u pft    HPI Eyal presents for a follow-up visit for COPD and emphysema.  PFT results -- mild restrictive, slightly improved but minimal change.  Lung cancer screening -- no nodule found, lung-rads 1 negative.  Does not need any refills of trelegy or albuterol Denies any significant SOB, chest tightness, cough, or wheezing.      Current Medication: Outpatient Encounter Medications as of 06/02/2022  Medication Sig   albuterol (VENTOLIN HFA) 108 (90 Base) MCG/ACT inhaler Inhale 2 puffs into the lungs every 6 (six) hours as needed for wheezing or shortness of breath.   amLODipine-benazepril (LOTREL) 10-40 MG capsule Take 1 capsule by mouth daily before breakfast.    atorvastatin (LIPITOR) 40 MG tablet Take 40 mg by mouth daily.    citalopram (CELEXA) 10 MG tablet Take 10 mg by mouth daily before breakfast.    esomeprazole (NEXIUM) 20 MG capsule Take 20 mg by mouth daily before breakfast.   fluticasone (FLONASE) 50 MCG/ACT nasal spray Place 2 sprays into both nostrils daily.   Fluticasone-Umeclidin-Vilant (TRELEGY ELLIPTA) 100-62.5-25 MCG/ACT AEPB Inhale 1 puff into the lungs daily.   furosemide (LASIX) 20 MG tablet Take 20 mg by mouth daily.   gabapentin (NEURONTIN) 300 MG capsule Take 300 mg by mouth 2 (two) times daily.    glimepiride (AMARYL) 2 MG tablet Take 2 mg by mouth in the morning and at bedtime.    HYDROcodone-acetaminophen (NORCO) 10-325 MG tablet Take 2 tablets by mouth in the morning and at bedtime.    insulin glargine (LANTUS) 100 UNIT/ML Solostar Pen Inject 65 Units into the skin once a week.   metFORMIN (GLUCOPHAGE) 500 MG tablet Take 1,000 mg by mouth 2 (two) times daily with a meal.    Multiple Vitamins-Minerals  (MULTIVITAMIN WITH MINERALS) tablet Take 1 tablet by mouth daily.   nabumetone (RELAFEN) 750 MG tablet Take 2 tablets by mouth daily.   ONETOUCH VERIO test strip SMARTSIG:Via Meter   OZEMPIC, 0.25 OR 0.5 MG/DOSE, 2 MG/3ML SOPN Inject 0.5 mg into the skin once a week.   quiNINE (QUALAQUIN) 324 MG capsule Take 1 capsule by mouth daily.    spironolactone (ALDACTONE) 50 MG tablet Take 50 mg by mouth daily.   No facility-administered encounter medications on file as of 06/02/2022.    Surgical History: Past Surgical History:  Procedure Laterality Date   ANTERIOR CERVICAL DECOMP/DISCECTOMY FUSION N/A 07/18/2015   Procedure: CERVICALFIVE-SIX, CERVICAL SIX-SEVEN ANTERIOR CERVICAL DECOMPRESSION/DISCECTOMY FUSION;  Surgeon: Newman Pies, MD;  Location: [facility];  Service: Neurosurgery;  Laterality: N/A;  C56 C67 anterior cervical decompression with fusion interbody prosthesis plating and bonegraft   COLONOSCOPY     COLONOSCOPY WITH PROPOFOL N/A 08/03/2019   Procedure: COLONOSCOPY WITH PROPOFOL;  Surgeon: Toledo, [name], MD;  Location: [facility];  Service: Gastroenterology;  Laterality: N/A;   ESOPHAGOGASTRODUODENOSCOPY (EGD) WITH PROPOFOL N/A 08/03/2019   Procedure: ESOPHAGOGASTRODUODENOSCOPY (EGD) WITH PROPOFOL;  Surgeon: Toledo, [name], MD;  Location: [facility];  Service: Gastroenterology;  Laterality: N/A;   leg stint     nov 2021   LOWER EXTREMITY ANGIOGRAPHY Left 02/07/2020   Procedure: LOWER EXTREMITY ANGIOGRAPHY;  Surgeon: Katha Cabal, MD;  Location: [facility];  Service: Cardiovascular;  Laterality:  Left;   RECTAL POLYPECTOMY  4-5 yrs ago    Medical History: Past Medical History:  Diagnosis Date   Arthritis    DDD- lumbar , thumbs - both hands    COPD (chronic obstructive pulmonary disease) (HCC)    Depression    Diabetes mellitus without complication (HCC)    GERD (gastroesophageal reflux disease)    History of kidney stones 1990's    lithotripsy  done    Hypertension    Sleep apnea 01/30/2016   [facility] in [place], results not avail to pt. yet    Family History: Family History  Problem Relation Age of Onset   Diabetes Mother    Asthma Mother    Hypertension Mother    Hyperlipidemia Mother    Hyperlipidemia Father    Dementia Father    Hypertension Father    Heart Problems Father        pace maker    Social History   Socioeconomic History   Marital status: Married    Spouse name: Not on file   Number of children: Not on file   Years of education: Not on file   Highest education level: Not on file  Occupational History   Not on file  Tobacco Use   Smoking status: Every Day    Types: E-cigarettes    Last attempt to quit: 05/2016    Years since quitting: 6.0   Smokeless tobacco: Never   Tobacco comments:    Pt uses a vape 10/22/21  Vaping Use   Vaping Use: Every day   Substances: Nicotine  Substance and Sexual Activity   Alcohol use: Yes    Alcohol/week: 3.0 standard drinks of alcohol    Types: 3 Cans of beer per week    Comment: daily    Drug use: No   Sexual activity: Not on file  Other Topics Concern   Not on file  Social History Narrative   Not on file   Social Determinants of Health   Financial Resource Strain: Not on file  Food Insecurity: Not on file  Transportation Needs: Not on file  Physical Activity: Not on file  Stress: Not on file  Social Connections: Not on file  Intimate Partner Violence: Not on file      Review of Systems  Constitutional:  Negative for chills, fatigue and unexpected weight change.  HENT:  Negative for congestion, rhinorrhea, sneezing and sore throat.   Eyes:  Negative for redness.  Respiratory: Negative.  Negative for cough, chest tightness, shortness of breath and wheezing.   Cardiovascular: Negative.  Negative for chest pain and palpitations.  Gastrointestinal:  Negative for abdominal pain, constipation, diarrhea, nausea and vomiting.   Genitourinary:  Negative for dysuria and frequency.  Musculoskeletal:  Negative for arthralgias, back pain, joint swelling and neck pain.  Skin:  Negative for rash.  Neurological: Negative.  Negative for tremors and numbness.  Hematological:  Negative for adenopathy. Does not bruise/bleed easily.  Psychiatric/Behavioral:  Negative for behavioral problems (Depression), sleep disturbance and suicidal ideas. The patient is not nervous/anxious.     Vital Signs: BP (!) 104/57   Pulse 70   Temp 98.2 F (36.8 C)   Resp 16   Ht '5\' 6"'$  (1.676 m)   Wt 213 lb 3.2 oz (96.7 kg)   SpO2 95%   BMI 34.41 kg/m    Physical Exam Vitals reviewed.  Constitutional:      General: He is not in acute distress.  Appearance: Normal appearance. He is obese. He is not ill-appearing.  HENT:     Head: Normocephalic and atraumatic.  Eyes:     Pupils: Pupils are equal, round, and reactive to light.  Cardiovascular:     Rate and Rhythm: Normal rate and regular rhythm.     Heart sounds: Normal heart sounds. No murmur heard. Pulmonary:     Effort: Pulmonary effort is normal. No respiratory distress.     Breath sounds: Normal breath sounds. No wheezing, rhonchi or rales.  Neurological:     Mental Status: He is alert.  Psychiatric:        Mood and Affect: Mood normal.        Assessment/Plan: 1. Obstructive chronic bronchitis without exacerbation Continue trelegy as prescribed. No refills needed at this time, follow up in 6 months. Repeat PFT in 1 year  2. SOB (shortness of breath) Continue prn albuterol as prescribed. No refills needed at this time.   3. Personal history of tobacco use, presenting hazards to health Reviewed chest CT with patient, lung-rads 1 negative. Repeat chest CT in 1 year.    General Counseling: Fielding verbalizes understanding of the findings of todays visit and agrees with plan of treatment. I have discussed any further diagnostic evaluation that may be needed or ordered  today. We also reviewed his medications today. he has been encouraged to call the office with any questions or concerns that should arise related to todays visit.    No orders of the defined types were placed in this encounter.   No orders of the defined types were placed in this encounter.   Return in about 6 months (around 12/03/2022) for F/U, pulmonary only, Jaylissa Felty or DSK.   Total time spent:30 Minutes Time spent includes review of chart, medications, test results, and follow up plan with the patient.   [place] Controlled Substance Database was reviewed by me.  This patient was seen by Jonetta Osgood, FNP-C in collaboration with Dr. Clayborn Bigness as a part of collaborative care agreement.   Kelbie Moro R. Valetta Fuller, MSN, FNP-C Internal medicine

## 2022-06-03 LAB — PULMONARY FUNCTION TEST

## 2022-06-05 ENCOUNTER — Ambulatory Visit: Payer: Medicare HMO | Attending: Pain Medicine | Admitting: Pain Medicine

## 2022-06-05 ENCOUNTER — Encounter: Payer: Self-pay | Admitting: Pain Medicine

## 2022-06-05 ENCOUNTER — Ambulatory Visit
Admission: RE | Admit: 2022-06-05 | Discharge: 2022-06-05 | Disposition: A | Discharge status: Home / Self Care | Payer: Medicare HMO | Source: Ambulatory Visit | Attending: Pain Medicine | Admitting: Pain Medicine

## 2022-06-05 VITALS — BP 110/60 | HR 59 | Temp 97.3°F | Resp 16 | Ht 66.0 in | Wt 213.0 lb

## 2022-06-05 DIAGNOSIS — M47817 Spondylosis without myelopathy or radiculopathy, lumbosacral region: Secondary | ICD-10-CM | POA: Diagnosis present

## 2022-06-05 DIAGNOSIS — R937 Abnormal findings on diagnostic imaging of other parts of musculoskeletal system: Secondary | ICD-10-CM | POA: Diagnosis present

## 2022-06-05 DIAGNOSIS — M545 Low back pain, unspecified: Secondary | ICD-10-CM | POA: Insufficient documentation

## 2022-06-05 DIAGNOSIS — M47816 Spondylosis without myelopathy or radiculopathy, lumbar region: Secondary | ICD-10-CM

## 2022-06-05 DIAGNOSIS — M5459 Other low back pain: Secondary | ICD-10-CM | POA: Diagnosis present

## 2022-06-05 DIAGNOSIS — G8929 Other chronic pain: Secondary | ICD-10-CM

## 2022-06-05 MED ORDER — LIDOCAINE HCL 2 % IJ SOLN
20.0000 mL | Freq: Once | INTRAMUSCULAR | Status: AC
  Administered 2022-06-05: 400 mg
  Filled 2022-06-05: qty 20

## 2022-06-05 MED ORDER — LACTATED RINGERS IV SOLN: Freq: Once | INTRAVENOUS | Status: AC

## 2022-06-05 MED ORDER — PENTAFLUOROPROP-TETRAFLUOROETH EX AERO
INHALATION_SPRAY | Freq: Once | CUTANEOUS | Status: AC
  Administered 2022-06-05: 30 via TOPICAL

## 2022-06-05 MED ORDER — MIDAZOLAM HCL 2 MG/2ML IJ SOLN
INTRAMUSCULAR | Status: AC
  Filled 2022-06-05: qty 2

## 2022-06-05 MED ORDER — ROPIVACAINE HCL 2 MG/ML IJ SOLN
9.0000 mL | Freq: Once | INTRAMUSCULAR | Status: AC
  Administered 2022-06-05: 20 mL via PERINEURAL
  Filled 2022-06-05: qty 20

## 2022-06-05 MED ORDER — TRIAMCINOLONE ACETONIDE 40 MG/ML IJ SUSP
40.0000 mg | Freq: Once | INTRAMUSCULAR | Status: AC
  Administered 2022-06-05: 40 mg
  Filled 2022-06-05: qty 1

## 2022-06-05 MED ORDER — MIDAZOLAM HCL 2 MG/2ML IJ SOLN
0.5000 mg | Freq: Once | INTRAMUSCULAR | Status: AC
  Administered 2022-06-05: 2 mg via INTRAVENOUS

## 2022-06-05 NOTE — Progress Notes (Signed)
PROVIDER NOTE: Interpretation of information contained herein should be left to medically-trained personnel. Specific patient instructions are provided elsewhere under "Patient Instructions" section of medical record. This document was created in part using STT-dictation technology, any transcriptional errors that may result from this process are unintentional.  Patient: Darin Waters Type: Established DOB: 1961/03/27 MRN: [id number] PCP: Sallee Lange, NP  Service: Procedure DOS: 06/05/2022 Setting: Ambulatory Location: Ambulatory outpatient facility Delivery: Face-to-face Provider: Gaspar Cola, MD Specialty: Interventional Pain Management Specialty designation: 09 Location: Outpatient facility Ref. Prov.: Milinda Pointer, MD       Interventional Therapy   Procedure: Lumbar Facet, Medial Branch Block(s) #3  Laterality: Left  Level: L3, L4, L5, and S1 Medial Branch Level(s). Injecting these levels blocks the L4-5 and L5-S1 lumbar facet joints. Imaging: Fluoroscopic guidance         Anesthesia: Local anesthesia (1-2% Lidocaine) Anxiolysis: IV Versed 2.0 mg Sedation: No Sedation                       DOS: 06/05/2022 Performed by: Gaspar Cola, MD  Primary Purpose: Diagnostic/Therapeutic Indications: Low back pain severe enough to impact quality of life or function. 1. Lumbar facet syndrome (Left)   2. Spondylosis without myelopathy or radiculopathy, lumbosacral region   3. 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 Lumbar facet joint pain   5. Lumbosacral facet arthropathy (Bilateral: L4-5, L5-S1)   6. Abnormal x-ray of lumbar spine (10/11/2019)   7. Abnormal MRI, lumbar spine (09/13/2018)    NAS-11 Pain score:   Pre-procedure: 5 /10   Post-procedure: 0-No pain/10     Position / Prep / Materials:  Position: Prone  Prep solution: DuraPrep (Iodine Povacrylex [0.7% available iodine] and Isopropyl Alcohol, 74% w/w) Area  Prepped: Posterolateral Lumbosacral Spine (Wide prep: From the lower border of the scapula down to the end of the tailbone and from flank to flank.)  Materials:  Tray: Block Needle(s):  Type: Spinal  Gauge (G): 22  Length: 5-in Qty: 4      Pre-op H&P Assessment:  Darin Waters is a 62 y.o. (year old), male patient, seen today for interventional treatment. He  has a past surgical history that includes Colonoscopy; Rectal polypectomy (4-5 yrs ago); Anterior cervical decomp/discectomy fusion (N/A, 07/18/2015); Esophagogastroduodenoscopy (egd) with propofol (N/A, 08/03/2019); Colonoscopy with propofol (N/A, 08/03/2019); Lower Extremity Angiography (Left, 02/07/2020); and leg stint. Darin Waters has a current medication list which includes the following prescription(s): albuterol, amlodipine-benazepril, atorvastatin, citalopram, esomeprazole, fluticasone, trelegy ellipta, furosemide, gabapentin, glimepiride, hydrocodone-acetaminophen, insulin glargine, metformin, multivitamin with minerals, nabumetone, onetouch verio, ozempic (0.25 or 0.5 mg/dose), quinine, and spironolactone, and the following Facility-Administered Medications: lactated ringers. His primarily concern today is the Back Pain  Initial Vital Signs:  Pulse/HCG Rate: (!) 59ECG Heart Rate: 63 Temp: (!) 97.3 F (36.3 C) Resp: 18 BP: (!) 111/58 SpO2: 98 %  BMI: Estimated body mass index is 34.38 kg/m as calculated from the following:   Height as of this encounter: '5\' 6"'$  (1.676 m).   Weight as of this encounter: 213 lb (96.6 kg).  Risk Assessment: Allergies: Reviewed. He has No Known Allergies.  Allergy Precautions: None required Coagulopathies: Reviewed. None identified.  Blood-thinner therapy: None at this time Active Infection(s): Reviewed. None identified. Darin Waters is afebrile  Site Confirmation: Darin Waters was asked to confirm the procedure and laterality before marking the site Procedure checklist: Completed Consent: Before  the procedure and under the influence  of no sedative(s), amnesic(s), or anxiolytics, the patient was informed of the treatment options, risks and possible complications. To fulfill our ethical and legal obligations, as recommended by the American Medical Association's Code of Ethics, I have informed the patient of my clinical impression; the nature and purpose of the treatment or procedure; the risks, benefits, and possible complications of the intervention; the alternatives, including doing nothing; the risk(s) and benefit(s) of the alternative treatment(s) or procedure(s); and the risk(s) and benefit(s) of doing nothing. The patient was provided information about the general risks and possible complications associated with the procedure. These may include, but are not limited to: failure to achieve desired goals, infection, bleeding, organ or nerve damage, allergic reactions, paralysis, and death. In addition, the patient was informed of those risks and complications associated to Spine-related procedures, such as failure to decrease pain; infection (i.e.: Meningitis, epidural or intraspinal abscess); bleeding (i.e.: epidural hematoma, subarachnoid hemorrhage, or any other type of intraspinal or peri-dural bleeding); organ or nerve damage (i.e.: Any type of peripheral nerve, nerve root, or spinal cord injury) with subsequent damage to sensory, motor, and/or autonomic systems, resulting in permanent pain, numbness, and/or weakness of one or several areas of the body; allergic reactions; (i.e.: anaphylactic reaction); and/or death. Furthermore, the patient was informed of those risks and complications associated with the medications. These include, but are not limited to: allergic reactions (i.e.: anaphylactic or anaphylactoid reaction(s)); adrenal axis suppression; blood sugar elevation that in diabetics may result in ketoacidosis or comma; water retention that in patients with history of congestive heart  failure may result in shortness of breath, pulmonary edema, and decompensation with resultant heart failure; weight gain; swelling or edema; medication-induced neural toxicity; particulate matter embolism and blood vessel occlusion with resultant organ, and/or nervous system infarction; and/or aseptic necrosis of one or more joints. Finally, the patient was informed that Medicine is not an exact science; therefore, there is also the possibility of unforeseen or unpredictable risks and/or possible complications that may result in a catastrophic outcome. The patient indicated having understood very clearly. We have given the patient no guarantees and we have made no promises. Enough time was given to the patient to ask questions, all of which were answered to the patient's satisfaction. Mr. Teem has indicated that he wanted to continue with the procedure. Attestation: I, the ordering provider, attest that I have discussed with the patient the benefits, risks, side-effects, alternatives, likelihood of achieving goals, and potential problems during recovery for the procedure that I have provided informed consent. Date  Time: 06/05/2022 11:06 AM   Pre-Procedure Preparation:  Monitoring: As per clinic protocol. Respiration, ETCO2, SpO2, BP, heart rate and rhythm monitor placed and checked for adequate function Safety Precautions: Patient was assessed for positional comfort and pressure points before starting the procedure. Time-out: I initiated and conducted the "Time-out" before starting the procedure, as per protocol. The patient was asked to participate by confirming the accuracy of the "Time Out" information. Verification of the correct person, site, and procedure were performed and confirmed by me, the nursing staff, and the patient. "Time-out" conducted as per Joint Commission's Universal Protocol (UP.01.01.01). Time: 1155  Description of Procedure:          Laterality: (see above) Targeted Levels:  (see above)  Safety Precautions: Aspiration looking for blood return was conducted prior to all injections. At no point did we inject any substances, as a needle was being advanced. Before injecting, the patient was told to immediately notify me if  he was experiencing any new onset of "ringing in the ears, or metallic taste in the mouth". No attempts were made at seeking any paresthesias. Safe injection practices and needle disposal techniques used. Medications properly checked for expiration dates. SDV (single dose vial) medications used. After the completion of the procedure, all disposable equipment used was discarded in the proper designated medical waste containers. Local Anesthesia: Protocol guidelines were followed. The patient was positioned over the fluoroscopy table. The area was prepped in the usual manner. The time-out was completed. The target area was identified using fluoroscopy. A 12-in long, straight, sterile hemostat was used with fluoroscopic guidance to locate the targets for each level blocked. Once located, the skin was marked with an approved surgical skin marker. Once all sites were marked, the skin (epidermis, dermis, and hypodermis), as well as deeper tissues (fat, connective tissue and muscle) were infiltrated with a small amount of a short-acting local anesthetic, loaded on a 10cc syringe with a 25G, 1.5-in  Needle. An appropriate amount of time was allowed for local anesthetics to take effect before proceeding to the next step. Local Anesthetic: Lidocaine 2.0% The unused portion of the local anesthetic was discarded in the proper designated containers. Technical description of process:  L2 Medial Branch Nerve Block (MBB): The target area for the L2 medial branch is at the junction of the postero-lateral aspect of the superior articular process and the superior, posterior, and medial edge of the transverse process of L3. Under fluoroscopic guidance, a Quincke needle was inserted  until contact was made with os over the superior postero-lateral aspect of the pedicular shadow (target area). After negative aspiration for blood, 0.5 mL of the nerve block solution was injected without difficulty or complication. The needle was removed intact. L3 Medial Branch Nerve Block (MBB): The target area for the L3 medial branch is at the junction of the postero-lateral aspect of the superior articular process and the superior, posterior, and medial edge of the transverse process of L4. Under fluoroscopic guidance, a Quincke needle was inserted until contact was made with os over the superior postero-lateral aspect of the pedicular shadow (target area). After negative aspiration for blood, 0.5 mL of the nerve block solution was injected without difficulty or complication. The needle was removed intact. L4 Medial Branch Nerve Block (MBB): The target area for the L4 medial branch is at the junction of the postero-lateral aspect of the superior articular process and the superior, posterior, and medial edge of the transverse process of L5. Under fluoroscopic guidance, a Quincke needle was inserted until contact was made with os over the superior postero-lateral aspect of the pedicular shadow (target area). After negative aspiration for blood, 0.5 mL of the nerve block solution was injected without difficulty or complication. The needle was removed intact. L5 Medial Branch Nerve Block (MBB): The target area for the L5 medial branch is at the junction of the postero-lateral aspect of the superior articular process and the superior, posterior, and medial edge of the sacral ala. Under fluoroscopic guidance, a Quincke needle was inserted until contact was made with os over the superior postero-lateral aspect of the pedicular shadow (target area). After negative aspiration for blood, 0.5 mL of the nerve block solution was injected without difficulty or complication. The needle was removed intact. S1 Medial Branch  Nerve Block (MBB): The target area for the S1 medial branch is at the posterior and inferior 6 o'clock position of the L5-S1 facet joint. Under fluoroscopic guidance, the Quincke needle inserted  for the L5 MBB was redirected until contact was made with os over the inferior and postero aspect of the sacrum, at the 6 o' clock position under the L5-S1 facet joint (Target area). After negative aspiration for blood, 0.5 mL of the nerve block solution was injected without difficulty or complication. The needle was removed intact.  Once the entire procedure was completed, the treated area was cleaned, making sure to leave some of the prepping solution back to take advantage of its long term bactericidal properties.         Illustration of the posterior view of the lumbar spine and the posterior neural structures. Laminae of L2 through S1 are labeled. DPRL5, dorsal primary ramus of L5; DPRS1, dorsal primary ramus of S1; DPR3, dorsal primary ramus of L3; FJ, facet (zygapophyseal) joint L3-L4; I, inferior articular process of L4; LB1, lateral branch of dorsal primary ramus of L1; IAB, inferior articular branches from L3 medial branch (supplies L4-L5 facet joint); IBP, intermediate branch plexus; MB3, medial branch of dorsal primary ramus of L3; NR3, third lumbar nerve root; S, superior articular process of L5; SAB, superior articular branches from L4 (supplies L4-5 facet joint also); TP3, transverse process of L3.  Vitals:   06/05/22 1105 06/05/22 1155 06/05/22 1200 06/05/22 1207  BP: (!) 111/58 107/76 115/74 110/60  Pulse: (!) 59   (!) 59  Resp:  '18 14 16  '$ Temp: (!) 97.3 F (36.3 C)     SpO2: 98% 94% 94% 94%  Weight: 213 lb (96.6 kg)     Height: '5\' 6"'$  (1.676 m)        Start Time: 1156 hrs. End Time: 1201 hrs.  Imaging Guidance (Spinal):          Type of Imaging Technique: Fluoroscopy Guidance (Spinal) Indication(s): Assistance in needle guidance and placement for procedures requiring needle  placement in or near specific anatomical locations not easily accessible without such assistance. Exposure Time: Please see nurses notes. Contrast: None used. Fluoroscopic Guidance: I was personally present during the use of fluoroscopy. "Tunnel Vision Technique" used to obtain the best possible view of the target area. Parallax error corrected before commencing the procedure. "Direction-depth-direction" technique used to introduce the needle under continuous pulsed fluoroscopy. Once target was reached, antero-posterior, oblique, and lateral fluoroscopic projection used confirm needle placement in all planes. Images permanently stored in EMR. Interpretation: No contrast injected. I personally interpreted the imaging intraoperatively. Adequate needle placement confirmed in multiple planes. Permanent images saved into the patient's record.  Post-operative Assessment:  Post-procedure Vital Signs:  Pulse/HCG Rate: (!) 5961 Temp: (!) 97.3 F (36.3 C) Resp: 16 BP: 110/60 SpO2: 94 %  EBL: None  Complications: No immediate post-treatment complications observed by team, or reported by patient.  Note: The patient tolerated the entire procedure well. A repeat set of vitals were taken after the procedure and the patient was kept under observation following institutional policy, for this type of procedure. Post-procedural neurological assessment was performed, showing return to baseline, prior to discharge. The patient was provided with post-procedure discharge instructions, including a section on how to identify potential problems. Should any problems arise concerning this procedure, the patient was given instructions to immediately contact us, at any time, without hesitation. In any case, we plan to contact the patient by telephone for a follow-up status report regarding this interventional procedure.  Comments:  No additional relevant information.  Plan of Care (POC)  Orders:  Orders Placed This  Encounter  Procedures   LUMBAR FACET(MEDIAL  BRANCH NERVE BLOCK) MBNB    Scheduling Instructions:     Procedure: Lumbar facet block (AKA.: Lumbosacral medial branch nerve block)     Side: Left-sided     Level: L3-4, L4-5, and L5-S1 Facets (L2, L3, L4, L5, and S1 Medial Branch Nerves)     Sedation: Patient's choice.     Timeframe: Today    Order Specific Question:   Where will this procedure be performed?    Answer:   [facility]   DG [facility] C-ARM 1-60 MIN NO REPORT    Intraoperative interpretation by procedural physician at [facility].    Standing Status:   Standing    Number of Occurrences:   1    Order Specific Question:   Reason for exam:    Answer:   Assistance in needle guidance and placement for procedures requiring needle placement in or near specific anatomical locations not easily accessible without such assistance.   Informed Consent Details: Physician/Practitioner Attestation; Transcribe to consent form and obtain patient signature    Nursing Order: Transcribe to consent form and obtain patient signature. Note: Always confirm laterality of pain with Mr. Cabal, before procedure.    Order Specific Question:   Physician/Practitioner attestation of informed consent for procedure/surgical case    Answer:   I, the physician/practitioner, attest that I have discussed with the patient the benefits, risks, side effects, alternatives, likelihood of achieving goals and potential problems during recovery for the procedure that I have provided informed consent.    Order Specific Question:   Procedure    Answer:   Lumbar Facet Block  under fluoroscopic guidance    Order Specific Question:   Physician/Practitioner performing the procedure    Answer:   Jhovany Weidinger A. Dossie Arbour MD    Order Specific Question:   Indication/Reason    Answer:   Low Back Pain, with our without leg pain, due to Facet Joint Arthralgia (Joint Pain) Spondylosis (Arthritis of the Spine), without  myelopathy or radiculopathy (Nerve Damage).   Provide equipment / supplies at bedside    Procedure tray: "Block Tray" (Disposable  single use) Skin infiltration needle: Regular 1.5-in, 25-G, (x1) Block Needle type: Spinal Amount/quantity: 4 Size: Medium (5-inch) Gauge: 22G    Standing Status:   Standing    Number of Occurrences:   1    Order Specific Question:   Specify    Answer:   Block Tray   Chronic Opioid Analgesic:  No chronic opioid analgesics therapy prescribed by our practice. Hydrocodone/APAP 10/325 1 tablet p.o. twice daily (20 mg/day of hydrocodone) (20 MME/day)  MME/day: 20 mg/day   Medications ordered for procedure: Meds ordered this encounter  Medications   lidocaine (XYLOCAINE) 2 % (with pres) injection 400 mg   pentafluoroprop-tetrafluoroeth (GEBAUERS) aerosol   lactated ringers infusion   midazolam (VERSED) injection 0.5-2 mg    Make sure Flumazenil is available in the pyxis when using this medication. If oversedation occurs, administer 0.2 mg IV over 15 sec. If after 45 sec no response, administer 0.2 mg again over 1 min; may repeat at 1 min intervals; not to exceed 4 doses (1 mg)   ropivacaine (PF) 2 mg/mL (0.2%) (NAROPIN) injection 9 mL   triamcinolone acetonide (KENALOG-40) injection 40 mg   Medications administered: We administered lidocaine, pentafluoroprop-tetrafluoroeth, lactated ringers, midazolam, ropivacaine (PF) 2 mg/mL (0.2%), and triamcinolone acetonide.  See the medical record for exact dosing, route, and time of administration.  Follow-up plan:   Return in about 2 weeks (  around 06/19/2022) for Proc-day (T,Th), (Face2F), (PPE).       Interventional Therapies  Risk  Complexity Considerations:   NOTE: Plavix Anticoagulation (Stop: 7-10 days  Restart: 2 hours) (Discontinued in 2022-23) Prior patient of Dr. Sharlet Salina  Uncontrolled type 2 diabetes with hyperglycemia  Nicotine dependence  tobacco abuse  COPD  SOB on exertion  CAD   hypertension    Lumbar Hardware  L4-5 Spacer     Planned  Pending:   Therapeutic left lumbar facet MBB #3    Under consideration:   Diagnostic left lumbar facet MBB #3  Diagnostic left SI joint block #1  Therapeutic Racz procedure #1  Diagnostic bilateral lumbar spinal cord stimulator trial    Completed:   Diagnostic left lumbar facet block x2 (10/16/2020) (100/100/100/100)  Diagnostic left caudal ESI + epidurogram x2 (01/10/2020) (100/100/1 100 x 2 weeks/50) (90/90/90/100)   Therapeutic  Palliative (PRN) options:   Therapeutic (ML) caudal ESI #3  Diagnostic/therapeutic left lumbar facet block #3       Recent Visits Date Type Provider Dept  05/29/22 Office Visit Milinda Pointer, MD [facility]  Showing recent visits within past 90 days and meeting all other requirements Today's Visits Date Type Provider Dept  06/05/22 Procedure visit Milinda Pointer, MD [facility]  Showing today's visits and meeting all other requirements Future Appointments Date Type Provider Dept  06/19/22 Appointment Milinda Pointer, MD [facility]  Showing future appointments within next 90 days and meeting all other requirements  Disposition: Discharge home  Discharge (Date  Time): 06/05/2022; 1213 hrs.   Primary Care Physician: Sallee Lange, NP Location: [facility] Note by: Gaspar Cola, MD (TTS technology used. I apologize for any typographical errors that were not detected and corrected.) Date: 06/05/2022; Time: 12:41 PM  Disclaimer:  Medicine is not an Chief Strategy Officer. The only guarantee in medicine is that nothing is guaranteed. It is important to note that the decision to proceed with this intervention was based on the information collected from the patient. The Data and conclusions were drawn from the patient's questionnaire, the interview, and the physical examination. Because the information was provided in  large part by the patient, it cannot be guaranteed that it has not been purposely or unconsciously manipulated. Every effort has been made to obtain as much relevant data as possible for this evaluation. It is important to note that the conclusions that lead to this procedure are derived in large part from the available data. Always take into account that the treatment will also be dependent on availability of resources and existing treatment guidelines, considered by other Pain Management Practitioners as being common knowledge and practice, at the time of the intervention. For Medico-Legal purposes, it is also important to point out that variation in procedural techniques and pharmacological choices are the acceptable norm. The indications, contraindications, technique, and results of the above procedure should only be interpreted and judged by a Board-Certified Interventional Pain Specialist with extensive familiarity and expertise in the same exact procedure and technique.

## 2022-06-05 NOTE — Progress Notes (Signed)
Safety precautions to be maintained throughout the outpatient stay will include: orient to surroundings, keep bed in low position, maintain call bell within reach at all times, provide assistance with transfer out of bed and ambulation.  

## 2022-06-05 NOTE — Patient Instructions (Signed)

## 2022-06-06 ENCOUNTER — Telehealth: Payer: Self-pay

## 2022-06-06 NOTE — Telephone Encounter (Signed)
Called PP wife answered phone, patient asleep but states he is doing well. Instructed to call if needed.

## 2022-06-17 NOTE — Progress Notes (Signed)
PROVIDER NOTE: Information contained herein reflects review and annotations entered in association with encounter. Interpretation of such information and data should be left to medically-trained personnel. Information provided to patient can be located elsewhere in the medical record under "Patient Instructions". Document created using STT-dictation technology, any transcriptional errors that may result from process are unintentional.    Patient: Darin Waters  Service Category: E/M  Provider: Oswaldo Done, MD  DOB: 1960-04-05  DOS: 06/19/2022  Referring Provider: Myrene Buddy, *  MRN: [id number]  Specialty: Interventional Pain Management  PCP: Myrene Buddy, NP  Type: Established Patient  Setting: Ambulatory outpatient    Location: Office  Delivery: Face-to-face     HPI  [name], a 62 y.o. year old male, is here today because of his Chronic left-sided low back pain without sciatica [M54.50, G89.29]. Mr. Seliga primary complain today is Back Pain (low)  Pertinent problems: Mr. 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 Pain Assessment: Severity of Chronic pain is reported as a 0-No pain/10. Location: Back Lower/denies today. Onset: More than a month ago. Quality: Sharp. Timing: Intermittent. Modifying factor(s): procedures, rest. Vitals:  height is 5\' 6"  (1.676 m) and weight is 215 lb (97.5 kg). His temporal temperature is 97.3 F (36.3 C) (abnormal). His blood pressure is 119/58 (abnormal) and his pulse is 56 (abnormal). His respiration is 18 and oxygen saturation is 99%.  BMI: Estimated body mass index is 34.7 kg/m as calculated from the following:   Height as of this encounter: 5\' 6"  (1.676 m).   Weight as of this encounter: 215 lb (97.5 kg). Last encounter: 05/29/2022. Last procedure: 06/05/2022.  Reason for encounter: post-procedure evaluation and assessment.  Today I have spent quite a bit of time with this patient going over the results of his diagnostic injections and explaining to him in layman's terms the results as well as the diagnostic purpose of the local anesthetic and the steroids as well as their limitations.  In reviewing the results from this procedure, the first thing that I have noticed is the inconsistency between the results of the procedure at the time that he was being discharged from the clinic and his subsequent description of the results that he attained for the duration of the local anesthetic.  At the time of the discharge from the clinic he had indicated to [place] that he was having absolutely no pain on the left side of his lower back.  Today he refers that he did not get any such relief.  However, he does indicate having attained a long-term, ongoing 50% relief of the pain that he was experiencing on the left lower back.  He also indicates being very satisfied with  those results.    Today he had some questions with regards to  whether or not it would be beneficial for him to go see the neurosurgeon that decompress his cervical and later his lumbar spine.  I informed the patient that it would probably be a good idea to eventually have the neurosurgeon review his current problem however seeing that the patient's last MRI had been ordered by the neurosurgeon on 10/11/2019 and at that time he indicated that there seems to be nothing else that he could offer him, it would probably be beneficial to repeat the MRI and updates the information to see if there has been significant changes in the past 4 years that would warrant a neurosurgical evaluation.  Currently the patient denies any lower extremity pain, numbness, or weakness suggesting that there is no radiculopathy/radiculitis that is active at this time.  His primary pain is axial and initially he did seem to respond well to the medial branch blocks.  However, he does have fusion hardware that may require assessment by the neurosurgeon.  For this reason, I will be ordering that MRI, which we will review once he has it done.  Post-procedure evaluation   Procedure: Lumbar Facet, Medial Branch Block(s) #3  Laterality: Left  Level: L3, L4, L5, and S1 Medial Branch Level(s). Injecting these levels blocks the L4-5 and L5-S1 lumbar facet joints. Imaging: Fluoroscopic guidance         Anesthesia: Local anesthesia (1-2% Lidocaine) Anxiolysis: IV Versed 2.0 mg Sedation: No Sedation                       DOS: 06/05/2022 Performed by: Oswaldo Done, MD  Primary Purpose: Diagnostic/Therapeutic Indications: Low back pain severe enough to impact quality of life or function. 1. Lumbar facet syndrome (Left)   2. Spondylosis without myelopathy or radiculopathy, lumbosacral region   3. 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 Lumbar facet joint pain   5. Lumbosacral facet arthropathy (Bilateral: L4-5, L5-S1)   6. Abnormal x-ray of lumbar spine  (10/11/2019)   7. Abnormal MRI, lumbar spine (09/13/2018)    NAS-11 Pain score:   Pre-procedure: 5 /10   Post-procedure: 0-No pain/10      Effectiveness:  Initial hour after procedure: 0 %. Subsequent 4-6 hours post-procedure: 0 %. Analgesia past initial 6 hours: 50 %. Ongoing improvement:  Analgesic: The patient indicates having an ongoing 50% relief of his low back pain. Function: Mr. Lizardi reports improvement in function ROM: Mr. Rutter reports improvement in ROM  Pharmacotherapy Assessment  Analgesic: No chronic opioid analgesics therapy prescribed by our practice. Hydrocodone/APAP 10/325 1 tablet p.o. twice daily (20 mg/day of hydrocodone) (20 MME/day)  MME/day: 20 mg/day   Monitoring: [place] PMP: PDMP reviewed during this encounter.       Pharmacotherapy: No side-effects or adverse reactions reported. Compliance: No problems identified. Effectiveness: Clinically acceptable.  No notes on file  No results found for: "CBDTHCR" No results found for: "D8THCCBX" No results found for: "D9THCCBX"  UDS:  Summary  Date Value Ref Range Status  10/10/2019 Note  Final    Comment:    ==================================================================== Compliance Drug Analysis, Ur ==================================================================== Test                             Result       Flag       Units  Drug Present and Declared  for Prescription Verification   Gabapentin                     PRESENT      EXPECTED   Citalopram                     PRESENT      EXPECTED   Desmethylcitalopram            PRESENT      EXPECTED    Desmethylcitalopram is an expected metabolite of citalopram or the    enantiomeric form, escitalopram.    Acetaminophen                  PRESENT      EXPECTED  Drug Present not Declared for Prescription Verification   Hydrocodone                    2963         UNEXPECTED ng/mg creat   Hydromorphone                  135          UNEXPECTED ng/mg  creat   Dihydrocodeine                 393          UNEXPECTED ng/mg creat   Norhydrocodone                 1000         UNEXPECTED ng/mg creat    Sources of hydrocodone include scheduled prescription medications.    Hydromorphone, dihydrocodeine and norhydrocodone are expected    metabolites of hydrocodone. Hydromorphone and dihydrocodeine are    also available as scheduled prescription medications.    Butorphanol                    PRESENT      UNEXPECTED  Drug Absent but Declared for Prescription Verification   Oxycodone                      Not Detected UNEXPECTED ng/mg creat   Cyclobenzaprine                Not Detected UNEXPECTED   Salicylate                     Not Detected UNEXPECTED    Aspirin, as indicated in the declared medication list, is not always    detected even when used as directed.  ==================================================================== Test                      Result    Flag   Units      Ref Range   Creatinine              185              mg/dL      >=09 ==================================================================== Declared Medications:  The flagging and interpretation on this report are based on the  following declared medications.  Unexpected results may arise from  inaccuracies in the declared medications.   **Note: The testing scope of this panel includes these medications:   Citalopram (Celexa)  Cyclobenzaprine (Flexeril)  Gabapentin (Neurontin)  Oxycodone (Percocet)   **Note: The testing scope of this panel does not include small to  moderate amounts of these reported medications:   Acetaminophen (  Percocet)  Aspirin   **Note: The testing scope of this panel does not include the  following reported medications:   Albuterol (Proair HFA)  Amlodipine (Lotrel)  Benazepril (Lotrel)  Budesonide (Symbicort)  Docusate (Colace)  Esomeprazole (Nexium)  Fluticasone (Trelegy)  Formoterol (Symbicort)  Furosemide (Lasix)   Glimepiride (Amaryl)  Insulin  Metformin (Glucophage)  Multivitamin  Quinine (Qualaquin)  Simvastatin (Zocor)  Umeclidinium (Trelegy)  Vilanterol (Trelegy) ==================================================================== For clinical consultation, please call [phone]. ====================================================================       ROS  Constitutional: Denies any fever or chills Gastrointestinal: No reported hemesis, hematochezia, vomiting, or acute GI distress Musculoskeletal: Denies any acute onset joint swelling, redness, loss of ROM, or weakness Neurological: No reported episodes of acute onset apraxia, aphasia, dysarthria, agnosia, amnesia, paralysis, loss of coordination, or loss of consciousness  Medication Review  Fluticasone-Umeclidin-Vilant, HYDROcodone-acetaminophen, Semaglutide(0.25 or 0.5MG /DOS), albuterol, amLODipine-benazepril, atorvastatin, citalopram, esomeprazole, fluticasone, furosemide, gabapentin, glimepiride, glucose blood, insulin glargine, metFORMIN, multivitamin with minerals, nabumetone, quiNINE, and spironolactone  History Review  Allergy: Mr. Turnquist has No Known Allergies. Drug: Mr. Kienle  reports no history of drug use. Alcohol:  reports current alcohol use of about 3.0 standard drinks of alcohol per week. Tobacco:  reports that he has been smoking e-cigarettes. He has never used smokeless tobacco. Social: Mr. Fittro  reports that he has been smoking e-cigarettes. He has never used smokeless tobacco. He reports current alcohol use of about 3.0 standard drinks of alcohol per week. He reports that he does not use drugs. Medical:  has a past medical history of Arthritis, COPD (chronic obstructive pulmonary disease) (HCC), Depression, Diabetes mellitus without complication (HCC), GERD (gastroesophageal reflux disease), History of kidney stones (1990's ), Hypertension, and Sleep apnea (01/30/2016). Surgical: Mr. Vandekamp  has a past  surgical history that includes Colonoscopy; Rectal polypectomy (4-5 yrs ago); Anterior cervical decomp/discectomy fusion (N/A, 07/18/2015); Esophagogastroduodenoscopy (egd) with propofol (N/A, 08/03/2019); Colonoscopy with propofol (N/A, 08/03/2019); Lower Extremity Angiography (Left, 02/07/2020); and leg stint. Family: family history includes Asthma in his mother; Dementia in his father; Diabetes in his mother; Heart Problems in his father; Hyperlipidemia in his father and mother; Hypertension in his father and mother.  Laboratory Chemistry Profile   Renal Lab Results  Component Value Date   BUN 21 02/07/2020   CREATININE 1.23 02/07/2020   BCR 14 10/10/2019   GFRAA 61 10/10/2019   GFRNONAA >60 02/07/2020    Hepatic Lab Results  Component Value Date   AST 30 10/10/2019   ALBUMIN 4.6 10/10/2019   ALKPHOS 59 10/10/2019    Electrolytes Lab Results  Component Value Date   NA 136 10/10/2019   K 5.7 (H) 10/10/2019   CL 100 10/10/2019   CALCIUM 9.8 10/10/2019   MG 2.0 10/10/2019    Bone Lab Results  Component Value Date   25OHVITD1 33 10/10/2019   25OHVITD2 <1.0 10/10/2019   25OHVITD3 33 10/10/2019    Inflammation (CRP: Acute Phase) (ESR: Chronic Phase) Lab Results  Component Value Date   CRP 2 10/10/2019   ESRSEDRATE 16 10/10/2019         Note: Above Lab results reviewed.  Recent Imaging Review  DG PAIN CLINIC C-ARM 1-60 MIN NO REPORT Fluoro was used, but no Radiologist interpretation will be provided.  Please refer to "NOTES" tab for provider progress note. Note: Reviewed        Physical Exam  General appearance: Well nourished, well developed, and well hydrated. In no apparent acute distress Mental status: Alert, oriented x 3 (person, place, &  time)       Respiratory: No evidence of acute respiratory distress Eyes: PERLA Vitals: BP (!) 119/58   Pulse (!) 56   Temp (!) 97.3 F (36.3 C) (Temporal)   Resp 18   Ht 5\' 6"  (1.676 m)   Wt 215 lb (97.5 kg)   SpO2 99%    BMI 34.70 kg/m  BMI: Estimated body mass index is 34.7 kg/m as calculated from the following:   Height as of this encounter: 5\' 6"  (1.676 m).   Weight as of this encounter: 215 lb (97.5 kg). Ideal: Ideal body weight: 63.8 kg (140 lb 10.5 oz) Adjusted ideal body weight: 77.3 kg (170 lb 6.3 oz)  Assessment   Diagnosis Status  1. Chronic low back pain (Left) w/o sciatica   2. Lumbar facet syndrome (Left)   3. Chronic lower extremity pain (1ry area of Pain) (Bilateral) (L>R)   4. Lumbar facet joint pain   5. Abnormal MRI, lumbar spine (09/13/2018)   6. DDD (degenerative disc disease), lumbar   7. Compression fracture of L1 lumbar vertebra, sequela   8. History of lumbar spinal fusion   9. Failed back surgical syndrome    Improved Improved Resolved   Updated Problems: No problems updated.  Plan of Care  Problem-specific:  No problem-specific Assessment & Plan notes found for this encounter.  Mr. CHAPPELL SARTINI has a current medication list which includes the following long-term medication(s): albuterol, amlodipine-benazepril, atorvastatin, citalopram, esomeprazole, fluticasone, furosemide, gabapentin, glimepiride, insulin glargine, metformin, and spironolactone.  Pharmacotherapy (Medications Ordered): No orders of the defined types were placed in this encounter.  Orders:  Orders Placed This Encounter  Procedures   MR LUMBAR SPINE WO CONTRAST    Patient presents with axial pain with possible radicular component. Please assist [place] in identifying specific level(s) and laterality of any additional findings such as: 1. Facet (Zygapophyseal) joint DJD (Hypertrophy, space narrowing, subchondral sclerosis, and/or osteophyte formation) 2. DDD and/or IVDD (Loss of disc height, desiccation, gas patterns, osteophytes, endplate sclerosis, or "Black disc disease") 3. Pars defects 4. Spondylolisthesis, spondylosis, and/or spondyloarthropathies (include Degree/Grade of displacement in mm)  (stability) 5. Vertebral body Fractures (acute/chronic) (state percentage of collapse) 6. Demineralization (osteopenia/osteoporotic) 7. Bone pathology 8. Foraminal narrowing  9. Surgical changes 10. Central, Lateral Recess, and/or Foraminal Stenosis (include AP diameter of stenosis in mm) 11. Surgical changes (hardware type, status, and presence of fibrosis) 12. Modic Type Changes (MRI only) 13. IVDD (Disc bulge, protrusion, herniation, extrusion) (Level, laterality, extent)    Standing Status:   Future    Standing Expiration Date:   07/20/2022    Scheduling Instructions:     Please make sure that the patient understands that this needs to be done as soon as possible. Never have the patient do the imaging "just before the next appointment". Inform patient that having the imaging done within the [facility] will expedite the availability of the results and will provide      imaging availability to the requesting physician. In addition inform the patient that the imaging order has an expiration date and will not be renewed if not done within the active period.    Order Specific Question:   What is the patient's sedation requirement?    Answer:   No Sedation    Order Specific Question:   Does the patient have a pacemaker or implanted devices?    Answer:   No    Order Specific Question:   Preferred imaging location?  Answer:   [facility] (table limit-350lbs)    Order Specific Question:   Call Results- Best Contact Number?    Answer:   [phone] [facility])    Order Specific Question:   Radiology Contrast Protocol - do NOT remove file path    Answer:   [email or web address]   Follow-up plan:   Return for Eval-day (M,W), (F2F), for review of ordered tests.      Interventional Therapies  Risk  Complexity Considerations:   NOTE: Plavix Anticoagulation (Stop: 7-10 days  Restart: 2 hours) (Discontinued in 2022-23) Prior patient of Dr. Yves Dill   Uncontrolled type 2 diabetes with hyperglycemia  Nicotine dependence  tobacco abuse  COPD  SOB on exertion  CAD  hypertension    Lumbar Hardware  L4-5 Spacer     Planned  Pending:   Therapeutic left lumbar facet MBB #3    Under consideration:   Diagnostic left lumbar facet MBB #3  Diagnostic left SI joint block #1  Therapeutic Racz procedure #1  Diagnostic bilateral lumbar spinal cord stimulator trial    Completed:   Diagnostic left lumbar facet block x2 (10/16/2020) (100/100/100/100)  Diagnostic left caudal ESI + epidurogram x2 (01/10/2020) (100/100/100[x 2 weeks]/50) (90/90/90/100)   Therapeutic  Palliative (PRN) options:   Therapeutic (ML) caudal ESI #3  Diagnostic/therapeutic left lumbar facet block #3        Recent Visits Date Type Provider Dept  06/05/22 Procedure visit Delano Metz, MD [facility]  05/29/22 Office Visit Delano Metz, MD [facility]  Showing recent visits within past 90 days and meeting all other requirements Today's Visits Date Type Provider Dept  06/19/22 Office Visit Delano Metz, MD [facility]  Showing today's visits and meeting all other requirements Future Appointments Date Type Provider Dept  07/14/22 Appointment Delano Metz, MD [facility]  Showing future appointments within next 90 days and meeting all other requirements  I discussed the assessment and treatment plan with the patient. The patient was provided an opportunity to ask questions and all were answered. The patient agreed with the plan and demonstrated an understanding of the instructions.  Patient advised to call back or seek an in-person evaluation if the symptoms or condition worsens.  Duration of encounter: 33 minutes.  Total time on encounter, as per AMA guidelines included both the face-to-face and non-face-to-face time personally spent by the physician and/or other qualified health care  professional(s) on the day of the encounter (includes time in activities that require the physician or other qualified health care professional and does not include time in activities normally performed by clinical staff). Physician's time may include the following activities when performed: Preparing to see the patient (e.g., pre-charting review of records, searching for previously ordered imaging, lab work, and nerve conduction tests) Review of prior analgesic pharmacotherapies. Reviewing PMP Interpreting ordered tests (e.g., lab work, imaging, nerve conduction tests) Performing post-procedure evaluations, including interpretation of diagnostic procedures Obtaining and/or reviewing separately obtained history Performing a medically appropriate examination and/or evaluation Counseling and educating the patient/family/caregiver Ordering medications, tests, or procedures Referring and communicating with other health care professionals (when not separately reported) Documenting clinical information in the electronic or other health record Independently interpreting results (not separately reported) and communicating results to the patient/ family/caregiver Care coordination (not separately reported)  Note by: Oswaldo Done, MD Date: 06/19/2022; Time: 6:45 PM

## 2022-06-19 ENCOUNTER — Ambulatory Visit: Payer: Medicare HMO | Attending: Pain Medicine | Admitting: Pain Medicine

## 2022-06-19 ENCOUNTER — Other Ambulatory Visit: Payer: Self-pay

## 2022-06-19 ENCOUNTER — Encounter: Payer: Self-pay | Admitting: Pain Medicine

## 2022-06-19 VITALS — BP 119/58 | HR 56 | Temp 97.3°F | Resp 18 | Ht 66.0 in | Wt 215.0 lb

## 2022-06-19 DIAGNOSIS — M5459 Other low back pain: Secondary | ICD-10-CM | POA: Diagnosis present

## 2022-06-19 DIAGNOSIS — G8929 Other chronic pain: Secondary | ICD-10-CM

## 2022-06-19 DIAGNOSIS — M961 Postlaminectomy syndrome, not elsewhere classified: Secondary | ICD-10-CM | POA: Diagnosis present

## 2022-06-19 DIAGNOSIS — M47816 Spondylosis without myelopathy or radiculopathy, lumbar region: Secondary | ICD-10-CM

## 2022-06-19 DIAGNOSIS — R937 Abnormal findings on diagnostic imaging of other parts of musculoskeletal system: Secondary | ICD-10-CM | POA: Diagnosis not present

## 2022-06-19 DIAGNOSIS — S32010S Wedge compression fracture of first lumbar vertebra, sequela: Secondary | ICD-10-CM | POA: Diagnosis present

## 2022-06-19 DIAGNOSIS — M5136 Other intervertebral disc degeneration, lumbar region: Secondary | ICD-10-CM

## 2022-06-19 DIAGNOSIS — M79605 Pain in left leg: Secondary | ICD-10-CM | POA: Diagnosis not present

## 2022-06-19 DIAGNOSIS — M79604 Pain in right leg: Secondary | ICD-10-CM | POA: Diagnosis not present

## 2022-06-19 DIAGNOSIS — Z981 Arthrodesis status: Secondary | ICD-10-CM | POA: Diagnosis present

## 2022-06-19 DIAGNOSIS — M545 Low back pain, unspecified: Secondary | ICD-10-CM | POA: Insufficient documentation

## 2022-07-01 ENCOUNTER — Telehealth: Payer: Self-pay

## 2022-07-01 NOTE — Telephone Encounter (Signed)
Insurance Treatment Denial Note  Date order was entered:  Order entered by: Milinda Pointer, MD Requested treatment: MRI  Reason for denial: Documentation of pertinent physical exam is missing. Recommended for approval:  Holland Falling states he has to have 6 weeks of interventional treament for this episode of pain  before they will authorize. Also they were asking about PT

## 2022-07-14 ENCOUNTER — Ambulatory Visit: Payer: Medicare HMO | Admitting: Pain Medicine

## 2022-07-15 ENCOUNTER — Telehealth: Payer: Self-pay | Admitting: Pain Medicine

## 2022-07-15 NOTE — Progress Notes (Signed)
PROVIDER NOTE: Information contained herein reflects review and annotations entered in association with encounter. Interpretation of such information and data should be left to medically-trained personnel. Information provided to patient can be located elsewhere in the medical record under "Patient Instructions". Document created using STT-dictation technology, any transcriptional errors that may result from process are unintentional.    Patient: Darin Waters  Service Category: E/M  Provider: Oswaldo Done, MD  DOB: [birth year]  DOS: 07/16/2022  Referring Provider: Myrene Buddy, *  MRN: [id number]  Specialty: Interventional Pain Management  PCP: Myrene Buddy, NP  Type: Established Patient  Setting: Ambulatory outpatient    Location: Office  Delivery: Face-to-face     HPI  [name], a 63 y.o. year old male, is here today because of his No primary diagnosis found.. [name] primary complain today is No chief complaint on file.  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 Pain Assessment: Severity of   is reported as a  /10. Location:    / . Onset:  . Quality:  . Timing:  . Modifying factor(s):  Marland Kitchen Vitals:  vitals were not taken for this visit.  BMI: Estimated body mass index is 34.7 kg/m as calculated from the following:   Height as of 06/19/22: 5\' 6"  (1.676 m).   Weight as of 06/19/22: 215 lb (97.5 kg). Last encounter: 06/19/2022. Last procedure: 06/05/2022.  Reason for encounter: follow-up evaluation.  Ordered MRI was denied by insurance company indicating that the patient needs to continue with 6 weeks of provider-based conservative therapy (????). this patient had a posterior lumbar interbody fusion on 02/13/2016 by Dr. Lovell Sheehan and has been coming to me for treatment of this same condition since 10/10/2019. (??????)  Pharmacotherapy Assessment  Analgesic: No chronic opioid analgesics therapy prescribed by our practice. Hydrocodone/APAP 10/325 1 tablet p.o. twice daily (20 mg/day of hydrocodone) (20 MME/day)  MME/day: 20 mg/day   Monitoring: [place] PMP: PDMP reviewed during this encounter.       Pharmacotherapy: No side-effects or adverse reactions reported. Compliance: No problems identified. Effectiveness: Clinically acceptable.  No notes on file  No results found for: "CBDTHCR" No results found for: "D8THCCBX" No results found for: "D9THCCBX"  UDS:  Summary  Date Value Ref Range Status  10/10/2019 Note  Final    Comment:    ==================================================================== Compliance Drug Analysis, Ur ==================================================================== Test                             Result       Flag       Units  Drug Present and Declared for Prescription Verification   Gabapentin  PRESENT      EXPECTED   Citalopram                     PRESENT      EXPECTED    Desmethylcitalopram            PRESENT      EXPECTED    Desmethylcitalopram is an expected metabolite of citalopram or the    enantiomeric form, escitalopram.    Acetaminophen                  PRESENT      EXPECTED  Drug Present not Declared for Prescription Verification   Hydrocodone                    2963         UNEXPECTED ng/mg creat   Hydromorphone                  135          UNEXPECTED ng/mg creat   Dihydrocodeine                 393          UNEXPECTED ng/mg creat   Norhydrocodone                 1000         UNEXPECTED ng/mg creat    Sources of hydrocodone include scheduled prescription medications.    Hydromorphone, dihydrocodeine and norhydrocodone are expected    metabolites of hydrocodone. Hydromorphone and dihydrocodeine are    also available as scheduled prescription medications.    Butorphanol                    PRESENT      UNEXPECTED  Drug Absent but Declared for Prescription Verification   Oxycodone                      Not Detected UNEXPECTED ng/mg creat   Cyclobenzaprine                Not Detected UNEXPECTED   Salicylate                     Not Detected UNEXPECTED    Aspirin, as indicated in the declared medication list, is not always    detected even when used as directed.  ==================================================================== Test                      Result    Flag   Units      Ref Range   Creatinine              185              mg/dL      >=16 ==================================================================== Declared Medications:  The flagging and interpretation on this report are based on the  following declared medications.  Unexpected results may arise from  inaccuracies in the declared medications.   **Note: The testing scope of this panel includes these medications:   Citalopram (Celexa)  Cyclobenzaprine (Flexeril)  Gabapentin (Neurontin)  Oxycodone (Percocet)   **Note: The testing scope of this panel does not include small  to  moderate amounts of these reported medications:   Acetaminophen (Percocet)  Aspirin   **Note: The testing scope of this panel does not include the  following reported medications:   Albuterol (Proair HFA)  Amlodipine (Lotrel)  Benazepril (Lotrel)  Budesonide (Symbicort)  Docusate (Colace)  Esomeprazole (Nexium)  Fluticasone (Trelegy)  Formoterol (Symbicort)  Furosemide (Lasix)  Glimepiride (Amaryl)  Insulin  Metformin (Glucophage)  Multivitamin  Quinine (Qualaquin)  Simvastatin (Zocor)  Umeclidinium (Trelegy)  Vilanterol (Trelegy) ==================================================================== For clinical consultation, please call [phone]. ====================================================================       ROS  Constitutional: Denies any fever or chills Gastrointestinal: No reported hemesis, hematochezia, vomiting, or acute GI distress Musculoskeletal: Denies any acute onset joint swelling, redness, loss of ROM, or weakness Neurological: No reported episodes of acute onset apraxia, aphasia, dysarthria, agnosia, amnesia, paralysis, loss of coordination, or loss of consciousness  Medication Review  Fluticasone-Umeclidin-Vilant, HYDROcodone-acetaminophen, Semaglutide(0.25 or 0.5MG /DOS), albuterol, amLODipine-benazepril, atorvastatin, citalopram, esomeprazole, fluticasone, furosemide, gabapentin, glimepiride, glucose blood, insulin glargine, metFORMIN, multivitamin with minerals, nabumetone, quiNINE, and spironolactone  History Review  Allergy: Darin Waters has No Known Allergies. Drug: Darin Waters  reports no history of drug use. Alcohol:  reports current alcohol use of about 3.0 standard drinks of alcohol per week. Tobacco:  reports that he has been smoking e-cigarettes. He has never used smokeless tobacco. Social: Darin Waters  reports that he has been smoking e-cigarettes. He has never used smokeless tobacco. He reports current alcohol use of about  3.0 standard drinks of alcohol per week. He reports that he does not use drugs. Medical:  has a past medical history of Arthritis, COPD (chronic obstructive pulmonary disease) (HCC), Depression, Diabetes mellitus without complication (HCC), GERD (gastroesophageal reflux disease), History of kidney stones (1990's ), Hypertension, and Sleep apnea (01/30/2016). Surgical: Darin Waters  has a past surgical history that includes Colonoscopy; Rectal polypectomy (4-5 yrs ago); Anterior cervical decomp/discectomy fusion (N/A, 07/18/2015); Esophagogastroduodenoscopy (egd) with propofol (N/A, 08/03/2019); Colonoscopy with propofol (N/A, 08/03/2019); Lower Extremity Angiography (Left, 02/07/2020); and leg stint. Family: family history includes Asthma in his mother; Dementia in his father; Diabetes in his mother; Heart Problems in his father; Hyperlipidemia in his father and mother; Hypertension in his father and mother.  Laboratory Chemistry Profile   Renal Lab Results  Component Value Date   BUN 21 02/07/2020   CREATININE 1.23 02/07/2020   BCR 14 10/10/2019   GFRAA 61 10/10/2019   GFRNONAA >60 02/07/2020    Hepatic Lab Results  Component Value Date   AST 30 10/10/2019   ALBUMIN 4.6 10/10/2019   ALKPHOS 59 10/10/2019    Electrolytes Lab Results  Component Value Date   NA 136 10/10/2019   K 5.7 (H) 10/10/2019   CL 100 10/10/2019   CALCIUM 9.8 10/10/2019   MG 2.0 10/10/2019    Bone Lab Results  Component Value Date   25OHVITD1 33 10/10/2019   25OHVITD2 <1.0 10/10/2019   25OHVITD3 33 10/10/2019    Inflammation (CRP: Acute Phase) (ESR: Chronic Phase) Lab Results  Component Value Date   CRP 2 10/10/2019   ESRSEDRATE 16 10/10/2019         Note: Above Lab results reviewed.  Recent Imaging Review  DG PAIN CLINIC C-ARM 1-60 MIN NO REPORT Fluoro was used, but no Radiologist interpretation will be provided.  Please refer to "NOTES" tab for provider progress note. Note: Reviewed        Physical  Exam  General appearance: Well nourished, well developed, and well hydrated. In no apparent acute distress Mental status: Alert, oriented x 3 (person, place, & time)       Respiratory: No evidence of acute respiratory distress Eyes: PERLA Vitals: There were no vitals taken for this visit.  BMI: Estimated body mass index is 34.7 kg/m as calculated from the following:   Height as of 06/19/22: 5\' 6"  (1.676 m).   Weight as of 06/19/22: 215 lb (97.5 kg). Ideal: Patient weight not recorded  Assessment   Diagnosis Status  No diagnosis found. Controlled Controlled Controlled   Updated Problems: No problems updated.  Plan of Care  Problem-specific:  No problem-specific Assessment & Plan notes found for this encounter.  Darin Waters has a current medication list which includes the following long-term medication(s): albuterol, amlodipine-benazepril, atorvastatin, citalopram, esomeprazole, fluticasone, furosemide, gabapentin, glimepiride, insulin glargine, metformin, and spironolactone.  Pharmacotherapy (Medications Ordered): No orders of the defined types were placed in this encounter.  Orders:  No orders of the defined types were placed in this encounter.  Follow-up plan:   No follow-ups on file.      Interventional Therapies  Risk  Complexity Considerations:   NOTE: Plavix Anticoagulation (Stop: 7-10 days  Restart: 2 hours) (Discontinued in 2022-23) Prior patient of Dr. Yves Dill  Uncontrolled type 2 diabetes with hyperglycemia  Nicotine dependence  tobacco abuse  COPD  SOB on exertion  CAD  hypertension    Lumbar Hardware  L4-5 Spacer     Planned  Pending:   Therapeutic left lumbar facet MBB #3    Under consideration:   Diagnostic left lumbar facet MBB #3  Diagnostic left SI joint block #1  Therapeutic Racz procedure #1  Diagnostic bilateral lumbar spinal cord stimulator trial    Completed:   Diagnostic left lumbar facet block x2 (10/16/2020)  (100/100/100/100)  Diagnostic left caudal ESI + epidurogram x2 (01/10/2020) (100/100/100[x 2 weeks]/50) (90/90/90/100)   Therapeutic  Palliative (PRN) options:   Therapeutic (ML) caudal ESI #3  Diagnostic/therapeutic left lumbar facet block #3       Recent Visits Date Type Provider Dept  06/19/22 Office Visit Delano Metz, MD [facility]  06/05/22 Procedure visit Delano Metz, MD [facility]  05/29/22 Office Visit Delano Metz, MD [facility]  Showing recent visits within past 90 days and meeting all other requirements Future Appointments Date Type Provider Dept  07/16/22 Appointment Delano Metz, MD [facility]  Showing future appointments within next 90 days and meeting all other requirements  I discussed the assessment and treatment plan with the patient. The patient was provided an opportunity to ask questions and all were answered. The patient agreed with the plan and demonstrated an understanding of the instructions.  Patient advised to call back or seek an in-person evaluation if the symptoms or condition worsens.  Duration of encounter: *** minutes.  Total time on encounter, as per AMA guidelines included both the face-to-face and non-face-to-face time personally spent by the physician and/or other qualified health care professional(s) on the day of the encounter (includes time in activities that require the physician or other qualified health care professional and does not include time in activities normally performed by clinical staff). Physician's time may include the following activities when performed: Preparing to see the patient (e.g., pre-charting review of records, searching for previously ordered imaging, lab work, and nerve conduction tests) Review of prior analgesic pharmacotherapies. Reviewing PMP Interpreting ordered tests (e.g., lab work, imaging, nerve conduction tests) Performing post-procedure  evaluations, including interpretation of diagnostic procedures Obtaining and/or reviewing separately obtained history Performing a medically appropriate examination and/or evaluation Counseling and educating the patient/family/caregiver Ordering medications, tests, or procedures Referring and communicating with other health care professionals (when not separately reported) Documenting clinical information in  the electronic or other health record Independently interpreting results (not separately reported) and communicating results to the patient/ family/caregiver Care coordination (not separately reported)  Note by: Oswaldo Done, MD Date: 07/16/2022; Time: 2:38 PM

## 2022-07-15 NOTE — Telephone Encounter (Signed)
Insurance Denied MRI. Patient must have 6 weeks of Physician based treatment.   FYI Dr Laban Emperor .

## 2022-07-16 ENCOUNTER — Encounter: Payer: Self-pay | Admitting: Pain Medicine

## 2022-07-16 ENCOUNTER — Ambulatory Visit: Payer: Medicare HMO | Attending: Pain Medicine | Admitting: Pain Medicine

## 2022-07-16 VITALS — HR 62 | Temp 97.5°F | Resp 18 | Ht 66.0 in | Wt 205.0 lb

## 2022-07-16 DIAGNOSIS — G96198 Other disorders of meninges, not elsewhere classified: Secondary | ICD-10-CM | POA: Insufficient documentation

## 2022-07-16 DIAGNOSIS — M79605 Pain in left leg: Secondary | ICD-10-CM | POA: Diagnosis present

## 2022-07-16 DIAGNOSIS — M961 Postlaminectomy syndrome, not elsewhere classified: Secondary | ICD-10-CM | POA: Insufficient documentation

## 2022-07-16 DIAGNOSIS — M79604 Pain in right leg: Secondary | ICD-10-CM | POA: Diagnosis present

## 2022-07-16 DIAGNOSIS — G8929 Other chronic pain: Secondary | ICD-10-CM | POA: Insufficient documentation

## 2022-07-16 DIAGNOSIS — S32010S Wedge compression fracture of first lumbar vertebra, sequela: Secondary | ICD-10-CM | POA: Insufficient documentation

## 2022-07-16 DIAGNOSIS — M5136 Other intervertebral disc degeneration, lumbar region: Secondary | ICD-10-CM | POA: Insufficient documentation

## 2022-07-16 DIAGNOSIS — M47817 Spondylosis without myelopathy or radiculopathy, lumbosacral region: Secondary | ICD-10-CM | POA: Insufficient documentation

## 2022-07-16 DIAGNOSIS — M5442 Lumbago with sciatica, left side: Secondary | ICD-10-CM | POA: Insufficient documentation

## 2022-07-16 DIAGNOSIS — M5441 Lumbago with sciatica, right side: Secondary | ICD-10-CM | POA: Diagnosis present

## 2022-07-16 DIAGNOSIS — R937 Abnormal findings on diagnostic imaging of other parts of musculoskeletal system: Secondary | ICD-10-CM | POA: Diagnosis present

## 2022-07-16 DIAGNOSIS — M4726 Other spondylosis with radiculopathy, lumbar region: Secondary | ICD-10-CM

## 2022-07-16 DIAGNOSIS — M5416 Radiculopathy, lumbar region: Secondary | ICD-10-CM | POA: Insufficient documentation

## 2022-07-16 NOTE — Patient Instructions (Addendum)
  ____________________________________________________________________________________________  Patient Information update  To: All of our patients.  Re: Name change.  It has been made official that our current name, "[facility]"   will soon be changed to " [facility]".   The purpose of this change is to eliminate any confusion created by the concept of our practice being a "Medication Management Pain Clinic". In the past this has led to the misconception that we treat pain primarily by the use of prescription medications.  Nothing can be farther from the truth.   Understanding PAIN MANAGEMENT: To further understand what our practice does, you first have to understand that "Pain Management" is a subspecialty that requires additional training once a physician has completed their specialty training, which can be in either Anesthesia, Neurology, Psychiatry, or Physical Medicine and Rehabilitation (PMR). Each one of these contributes to the final approach taken by each physician to the management of their patient's pain. To be a "Pain Management Specialist" you must have first completed one of the specialty trainings below.  Anesthesiologists - trained in clinical pharmacology and interventional techniques such as nerve blockade and regional as well as central neuroanatomy. They are trained to block pain before, during, and after surgical interventions.  Neurologists - trained in the diagnosis and pharmacological treatment of complex neurological conditions, such as Multiple Sclerosis, Parkinson's, spinal cord injuries, and other systemic conditions that may be associated with symptoms that may include but are not limited to pain. They tend to rely primarily on the treatment of chronic pain using prescription medications.  Psychiatrist - trained in conditions affecting the psychosocial  wellbeing of patients including but not limited to depression, anxiety, schizophrenia, personality disorders, addiction, and other substance use disorders that may be associated with chronic pain. They tend to rely primarily on the treatment of chronic pain using prescription medications.   Physical Medicine and Rehabilitation (PMR) physicians, also known as physiatrists - trained to treat a wide variety of medical conditions affecting the brain, spinal cord, nerves, bones, joints, ligaments, muscles, and tendons. Their training is primarily aimed at treating patients that have suffered injuries that have caused severe physical impairment. Their training is primarily aimed at the physical therapy and rehabilitation of those patients. They may also work alongside orthopedic surgeons or neurosurgeons using their expertise in assisting surgical patients to recover after their surgeries.  INTERVENTIONAL PAIN MANAGEMENT is sub-subspecialty of Pain Management.  Our physicians are Board-certified in Anesthesia, Pain Management, and Interventional Pain Management.  This meaning that not only have they been trained and Board-certified in their specialty of Anesthesia, and subspecialty of Pain Management, but they have also received further training in the sub-subspecialty of Interventional Pain Management, in order to become Board-certified as INTERVENTIONAL PAIN MANAGEMENT SPECIALIST.    Mission: Our goal is to use our skills in  INTERVENTIONAL PAIN MANAGEMENT as alternatives to the chronic use of prescription opioid medications for the treatment of pain. To make this more clear, we have changed our name to reflect what we do and offer. We will continue to offer medication management assessment and recommendations, but we will not be taking over any patient's medication management.  ____________________________________________________________________________________________     

## 2022-07-16 NOTE — Progress Notes (Signed)
Safety precautions to be maintained throughout the outpatient stay will include: orient to surroundings, keep bed in low position, maintain call bell within reach at all times, provide assistance with transfer out of bed and ambulation.  

## 2022-07-29 ENCOUNTER — Telehealth: Payer: Self-pay

## 2022-07-29 NOTE — Telephone Encounter (Signed)
MRI scheduled. Patients phone is not working. Sent message thru [employer]

## 2022-08-05 ENCOUNTER — Ambulatory Visit
Admission: RE | Admit: 2022-08-05 | Discharge: 2022-08-05 | Disposition: A | Discharge status: Home / Self Care | Payer: Medicare HMO | Source: Ambulatory Visit | Attending: Pain Medicine | Admitting: Pain Medicine

## 2022-08-05 DIAGNOSIS — R937 Abnormal findings on diagnostic imaging of other parts of musculoskeletal system: Secondary | ICD-10-CM

## 2022-08-05 DIAGNOSIS — M961 Postlaminectomy syndrome, not elsewhere classified: Secondary | ICD-10-CM

## 2022-08-05 DIAGNOSIS — G96198 Other disorders of meninges, not elsewhere classified: Secondary | ICD-10-CM | POA: Diagnosis present

## 2022-08-05 DIAGNOSIS — M5441 Lumbago with sciatica, right side: Secondary | ICD-10-CM | POA: Insufficient documentation

## 2022-08-05 DIAGNOSIS — M5442 Lumbago with sciatica, left side: Secondary | ICD-10-CM | POA: Diagnosis present

## 2022-08-05 DIAGNOSIS — M79604 Pain in right leg: Secondary | ICD-10-CM | POA: Diagnosis not present

## 2022-08-05 DIAGNOSIS — S32010S Wedge compression fracture of first lumbar vertebra, sequela: Secondary | ICD-10-CM

## 2022-08-05 DIAGNOSIS — M5136 Other intervertebral disc degeneration, lumbar region: Secondary | ICD-10-CM | POA: Insufficient documentation

## 2022-08-05 DIAGNOSIS — M5416 Radiculopathy, lumbar region: Secondary | ICD-10-CM

## 2022-08-05 DIAGNOSIS — G8929 Other chronic pain: Secondary | ICD-10-CM

## 2022-08-05 DIAGNOSIS — M47817 Spondylosis without myelopathy or radiculopathy, lumbosacral region: Secondary | ICD-10-CM | POA: Diagnosis present

## 2022-08-05 DIAGNOSIS — M79605 Pain in left leg: Secondary | ICD-10-CM | POA: Insufficient documentation

## 2022-08-05 DIAGNOSIS — M51369 Other intervertebral disc degeneration, lumbar region without mention of lumbar back pain or lower extremity pain: Secondary | ICD-10-CM

## 2022-09-04 ENCOUNTER — Other Ambulatory Visit (INDEPENDENT_AMBULATORY_CARE_PROVIDER_SITE_OTHER): Payer: Self-pay | Admitting: Vascular Surgery

## 2022-09-04 DIAGNOSIS — I70213 Atherosclerosis of native arteries of extremities with intermittent claudication, bilateral legs: Secondary | ICD-10-CM

## 2022-09-05 ENCOUNTER — Other Ambulatory Visit: Payer: Self-pay | Admitting: Neurosurgery

## 2022-09-05 DIAGNOSIS — M4722 Other spondylosis with radiculopathy, cervical region: Secondary | ICD-10-CM

## 2022-09-15 ENCOUNTER — Encounter (INDEPENDENT_AMBULATORY_CARE_PROVIDER_SITE_OTHER): Payer: Self-pay | Admitting: Vascular Surgery

## 2022-09-15 ENCOUNTER — Ambulatory Visit (INDEPENDENT_AMBULATORY_CARE_PROVIDER_SITE_OTHER): Payer: Medicare HMO | Admitting: Vascular Surgery

## 2022-09-15 ENCOUNTER — Ambulatory Visit (INDEPENDENT_AMBULATORY_CARE_PROVIDER_SITE_OTHER): Payer: Medicare HMO

## 2022-09-15 VITALS — BP 92/55 | HR 61 | Resp 18 | Ht 65.0 in | Wt 210.8 lb

## 2022-09-15 DIAGNOSIS — J449 Chronic obstructive pulmonary disease, unspecified: Secondary | ICD-10-CM | POA: Diagnosis not present

## 2022-09-15 DIAGNOSIS — I2510 Atherosclerotic heart disease of native coronary artery without angina pectoris: Secondary | ICD-10-CM

## 2022-09-15 DIAGNOSIS — I70213 Atherosclerosis of native arteries of extremities with intermittent claudication, bilateral legs: Secondary | ICD-10-CM

## 2022-09-15 DIAGNOSIS — I10 Essential (primary) hypertension: Secondary | ICD-10-CM

## 2022-09-15 DIAGNOSIS — I2583 Coronary atherosclerosis due to lipid rich plaque: Secondary | ICD-10-CM

## 2022-09-15 DIAGNOSIS — E1165 Type 2 diabetes mellitus with hyperglycemia: Secondary | ICD-10-CM

## 2022-09-15 NOTE — Progress Notes (Signed)
MRN : [id number]  Darin Waters is a 62 y.o. ([birth year]) male who presents with chief complaint of check circulation.  History of Present Illness:   The patient returns to the office for followup and review status post angiogram with intervention on 02/07/2020.    Procedure:    Percutaneous transluminal angioplasty and stent placement left common iliac artery with a 12 x 40 life star stent   The patient notes his walking remains stable , no decrease in his walking distance or increase in leg pain.  No interval shortening of the patient's claudication distance or rest pain symptoms. Previous wounds have now healed.  No new ulcers or wounds have occurred since the last visit.  The 1 thing the patient did note is that his left foot seems to be more numb than it was in the past.   There have been no significant changes to the patient's overall health care.   The patient denies amaurosis fugax or recent TIA symptoms. There are no recent neurological changes noted. The patient denies history of DVT, PE or superficial thrombophlebitis. The patient denies recent episodes of angina or shortness of breath.   ABI's Rt=1.13 and Lt=1.07  (previous ABI's Rt=1.08 and Lt=1.08)   Previous duplex US of the aorto iliac arterial system shows widely patent iliac stent  Current Meds  Medication Sig   albuterol (VENTOLIN HFA) 108 (90 Base) MCG/ACT inhaler Inhale 2 puffs into the lungs every 6 (six) hours as needed for wheezing or shortness of breath.   amLODipine-benazepril (LOTREL) 10-40 MG capsule Take 1 capsule by mouth daily before breakfast.    atorvastatin (LIPITOR) 40 MG tablet Take 40 mg by mouth daily.    citalopram (CELEXA) 10 MG tablet Take 10 mg by mouth daily before breakfast.    esomeprazole (NEXIUM) 20 MG capsule Take 20 mg by mouth daily before breakfast.   fluticasone (FLONASE) 50 MCG/ACT nasal spray Place 2 sprays  into both nostrils daily.   Fluticasone-Umeclidin-Vilant (TRELEGY ELLIPTA) 100-62.5-25 MCG/ACT AEPB Inhale 1 puff into the lungs daily.   furosemide (LASIX) 20 MG tablet Take 20 mg by mouth daily.   gabapentin (NEURONTIN) 300 MG capsule Take 300 mg by mouth 2 (two) times daily.    glimepiride (AMARYL) 2 MG tablet Take 2 mg by mouth in the morning and at bedtime.    HYDROcodone-acetaminophen (NORCO) 10-325 MG tablet Take 2 tablets by mouth in the morning and at bedtime.    insulin glargine (LANTUS) 100 UNIT/ML Solostar Pen Inject 65 Units into the skin once a week.   levothyroxine (SYNTHROID) 100 MCG tablet Take by mouth.   metFORMIN (GLUCOPHAGE) 500 MG tablet Take 1,000 mg by mouth 2 (two) times daily with a meal.    Multiple Vitamins-Minerals (MULTIVITAMIN WITH MINERALS) tablet Take 1 tablet by mouth daily.   nabumetone (RELAFEN) 750 MG tablet Take 2 tablets by mouth daily.   ONETOUCH VERIO test strip SMARTSIG:Via Meter   OZEMPIC, 0.25 OR 0.5 MG/DOSE, 2 MG/3ML SOPN Inject 0.5 mg into the skin once a week.   quiNINE (QUALAQUIN) 324 MG capsule Take 1  capsule by mouth daily.    spironolactone (ALDACTONE) 50 MG tablet Take 50 mg by mouth daily.    Past Medical History:  Diagnosis Date   Arthritis    DDD- lumbar , thumbs - both hands    COPD (chronic obstructive pulmonary disease) (HCC)    Depression    Diabetes mellitus without complication (HCC)    GERD (gastroesophageal reflux disease)    History of kidney stones 1990's    lithotripsy done    Hypertension    Hypothyroidism    Sleep apnea 01/30/2016   [facility] [place], results not avail to pt. yet    Past Surgical History:  Procedure Laterality Date   ANTERIOR CERVICAL DECOMP/DISCECTOMY FUSION N/A 07/18/2015   Procedure: CERVICALFIVE-SIX, CERVICAL SIX-SEVEN ANTERIOR CERVICAL DECOMPRESSION/DISCECTOMY FUSION;  Surgeon: Tressie Stalker, MD;  Location: [facility];  Service: Neurosurgery;  Laterality: N/A;  C56 C67 anterior  cervical decompression with fusion interbody prosthesis plating and bonegraft   COLONOSCOPY     COLONOSCOPY WITH PROPOFOL N/A 08/03/2019   Procedure: COLONOSCOPY WITH PROPOFOL;  Surgeon: Toledo, Boykin Nearing, MD;  Location: [facility];  Service: Gastroenterology;  Laterality: N/A;   ESOPHAGOGASTRODUODENOSCOPY (EGD) WITH PROPOFOL N/A 08/03/2019   Procedure: ESOPHAGOGASTRODUODENOSCOPY (EGD) WITH PROPOFOL;  Surgeon: Toledo, Boykin Nearing, MD;  Location: [facility];  Service: Gastroenterology;  Laterality: N/A;   leg stint     nov 2021   LOWER EXTREMITY ANGIOGRAPHY Left 02/07/2020   Procedure: LOWER EXTREMITY ANGIOGRAPHY;  Surgeon: Renford Dills, MD;  Location: [facility];  Service: Cardiovascular;  Laterality: Left;   RECTAL POLYPECTOMY  4-5 yrs ago    Social History Social History   Tobacco Use   Smoking status: Every Day    Types: E-cigarettes    Last attempt to quit: 05/2016    Years since quitting: 6.3   Smokeless tobacco: Never   Tobacco comments:    Pt uses a vape 10/22/21  Vaping Use   Vaping Use: Every day   Substances: Nicotine  Substance Use Topics   Alcohol use: Yes    Alcohol/week: 3.0 standard drinks of alcohol    Types: 3 Cans of beer per week    Comment: daily    Drug use: No    Family History Family History  Problem Relation Age of Onset   Diabetes Mother    Asthma Mother    Hypertension Mother    Hyperlipidemia Mother    Hyperlipidemia Father    Dementia Father    Hypertension Father    Heart Problems Father        pace maker    No Known Allergies   REVIEW OF SYSTEMS (Negative unless checked)  Constitutional: [] Weight loss  [] Fever  [] Chills Cardiac: [] Chest pain   [] Chest pressure   [] Palpitations   [] Shortness of breath when laying flat   [] Shortness of breath with exertion. Vascular:  [x] Pain in legs with walking   [] Pain in legs at rest  [] History of DVT   [] Phlebitis   [] Swelling in legs   [] Varicose veins   [] Non-healing  ulcers Pulmonary:   [] Uses home oxygen   [] Productive cough   [] Hemoptysis   [] Wheeze  [x] COPD   [] Asthma Neurologic:  [] Dizziness   [] Seizures   [] History of stroke   [] History of TIA  [] Aphasia   [] Vissual changes   [] Weakness or numbness in arm   [x] Weakness or numbness in leg Musculoskeletal:   [] Joint swelling   [] Joint pain   [x] Low back  pain Hematologic:  [] Easy bruising  [] Easy bleeding   [] Hypercoagulable state   [] Anemic Gastrointestinal:  [] Diarrhea   [] Vomiting  [] Gastroesophageal reflux/heartburn   [] Difficulty swallowing. Genitourinary:  [] Chronic kidney disease   [] Difficult urination  [] Frequent urination   [] Blood in urine Skin:  [] Rashes   [] Ulcers  Psychological:  [] History of anxiety   []  History of major depression.  Physical Examination  Vitals:   09/15/22 0925  BP: (!) 92/55  Pulse: 61  Resp: 18  Weight: 210 lb 12.8 oz (95.6 kg)  Height: 5\' 5"  (1.651 m)   Body mass index is 35.08 kg/m. Gen: WD/WN, NAD Head: [place]/AT, No temporalis wasting.  Ear/Nose/Throat: Hearing grossly intact, nares w/o erythema or drainage Eyes: PER, EOMI, sclera nonicteric.  Neck: Supple, no masses.  No bruit or JVD.  Pulmonary:  Good air movement, no audible wheezing, no use of accessory muscles.  Cardiac: RRR, normal S1, S2, no Murmurs. Vascular:  mild trophic changes, no open wounds Vessel Right Left  Radial Palpable Palpable  PT Not Palpable Not Palpable  DP Not Palpable Not Palpable  Gastrointestinal: soft, non-distended. No guarding/no peritoneal signs.  Musculoskeletal: M/S 5/5 throughout.  No visible deformity.  Neurologic: CN 2-12 intact. Pain and light touch intact in extremities.  Symmetrical.  Speech is fluent. Motor exam as listed above. Psychiatric: Judgment intact, Mood & affect appropriate for pt's clinical situation. Dermatologic: No rashes or ulcers noted.  No changes consistent with cellulitis.   CBC Lab Results  Component Value Date   WBC 20.2 (H) 02/14/2016    HGB 14.0 02/14/2016   HCT 40.8 02/14/2016   MCV 96.7 02/14/2016   PLT 219 02/14/2016    BMET    Component Value Date/Time   NA 136 10/10/2019 1642   NA 140 04/11/2011 0738   K 5.7 (H) 10/10/2019 1642   K 3.7 04/11/2011 0738   CL 100 10/10/2019 1642   CL 105 04/11/2011 0738   CO2 26 02/14/2016 0220   CO2 28 04/11/2011 0738   GLUCOSE 165 (H) 10/10/2019 1642   GLUCOSE 212 (H) 02/14/2016 0220   GLUCOSE 155 (H) 04/11/2011 0738   BUN 21 02/07/2020 0730   BUN 20 10/10/2019 1642   BUN 14 04/11/2011 0738   CREATININE 1.23 02/07/2020 0730   CREATININE 0.85 04/11/2011 0738   CALCIUM 9.8 10/10/2019 1642   CALCIUM 9.3 04/11/2011 0738   GFRNONAA >60 02/07/2020 0730   GFRNONAA >60 04/11/2011 0738   GFRAA 61 10/10/2019 1642   GFRAA >60 04/11/2011 0738   CrCl cannot be calculated (Patient's most recent lab result is older than the maximum 21 days allowed.).  COAG No results found for: "INR", "PROTIME"  Radiology No results found.   Assessment/Plan 1. Atherosclerosis of native artery of both lower extremities with intermittent claudication (HCC) Recommend:   The patient has evidence of atherosclerosis of the lower extremities with claudication.  The patient does not voice lifestyle limiting changes at this point in time.   Noninvasive studies do not suggest clinically significant change.   No invasive studies, angiography or surgery at this time The patient should continue walking and begin a more formal exercise program.  The patient should continue antiplatelet therapy and aggressive treatment of the lipid abnormalities   No changes in the patient's medications at this time   Continued surveillance is indicated as atherosclerosis is likely to progress with time.     The patient will continue follow up with noninvasive studies as ordered.   - VAS  [place] ABI WITH/WO TBI; Future  2. Coronary artery disease due to lipid rich plaque Continue cardiac and antihypertensive  medications as already ordered and reviewed, no changes at this time.  Continue statin as ordered and reviewed, no changes at this time  Nitrates PRN for chest pain  3. Hypertension, unspecified type Continue antihypertensive medications as already ordered, these medications have been reviewed and there are no changes at this time.  4. Chronic obstructive pulmonary disease, unspecified COPD type (HCC) Continue pulmonary medications and aerosols as already ordered, these medications have been reviewed and there are no changes at this time.   5. Uncontrolled type 2 diabetes mellitus with hyperglycemia (HCC) Continue hypoglycemic medications as already ordered, these medications have been reviewed and there are no changes at this time.  Hgb A1C to be monitored as already arranged by primary service    Levora Dredge, MD  09/15/2022 9:43 AM

## 2022-09-18 LAB — VAS US ABI WITH/WO TBI
Left ABI: 1.07
Right ABI: 1.13

## 2022-09-20 ENCOUNTER — Encounter (INDEPENDENT_AMBULATORY_CARE_PROVIDER_SITE_OTHER): Payer: Self-pay | Admitting: Vascular Surgery

## 2022-09-22 ENCOUNTER — Encounter: Payer: Self-pay | Admitting: Neurosurgery

## 2022-09-25 ENCOUNTER — Ambulatory Visit
Admission: RE | Admit: 2022-09-25 | Discharge: 2022-09-25 | Disposition: A | Discharge status: Home / Self Care | Payer: Medicare HMO | Source: Ambulatory Visit | Attending: Neurosurgery | Admitting: Neurosurgery

## 2022-09-25 DIAGNOSIS — M4722 Other spondylosis with radiculopathy, cervical region: Secondary | ICD-10-CM

## 2022-10-30 ENCOUNTER — Other Ambulatory Visit: Payer: Self-pay | Admitting: Nurse Practitioner

## 2022-10-30 DIAGNOSIS — J4489 Other specified chronic obstructive pulmonary disease: Secondary | ICD-10-CM

## 2022-12-02 ENCOUNTER — Ambulatory Visit: Payer: Medicare HMO | Admitting: Nurse Practitioner

## 2022-12-02 ENCOUNTER — Encounter: Payer: Self-pay | Admitting: Nurse Practitioner

## 2022-12-02 VITALS — BP 122/68 | HR 77 | Temp 98.1°F | Resp 16 | Ht 65.0 in | Wt 212.2 lb

## 2022-12-02 DIAGNOSIS — Z87891 Personal history of nicotine dependence: Secondary | ICD-10-CM | POA: Diagnosis not present

## 2022-12-02 DIAGNOSIS — R0602 Shortness of breath: Secondary | ICD-10-CM | POA: Diagnosis not present

## 2022-12-02 DIAGNOSIS — J4489 Other specified chronic obstructive pulmonary disease: Secondary | ICD-10-CM | POA: Diagnosis not present

## 2022-12-02 NOTE — Progress Notes (Signed)
[facility] [address] [place], [place] [address]  Internal MEDICINE  Office Visit Note  Patient Name: Darin Waters  [id number]  [id number]  Date of Service: 12/02/2022  Chief Complaint  Patient presents with   Follow-up    HPI Darin Waters presents for a follow-up visit for COPD and emphysema.  No significant change in breathing since last office visit Does not need any refills Re-reviewed PFT results from February with patient.  Uses his rescue inhaler infrequently. Takes trelegy daily which controls his symptoms well.      Current Medication: Outpatient Encounter Medications as of 12/02/2022  Medication Sig   albuterol (VENTOLIN HFA) 108 (90 Base) MCG/ACT inhaler Inhale 2 puffs into the lungs every 6 (six) hours as needed for wheezing or shortness of breath.   amLODipine-benazepril (LOTREL) 10-40 MG capsule Take 1 capsule by mouth daily before breakfast.    atorvastatin (LIPITOR) 40 MG tablet Take 40 mg by mouth daily.    citalopram (CELEXA) 10 MG tablet Take 10 mg by mouth daily before breakfast.    esomeprazole (NEXIUM) 20 MG capsule Take 20 mg by mouth daily before breakfast.   fluticasone (FLONASE) 50 MCG/ACT nasal spray Place 2 sprays into both nostrils daily.   furosemide (LASIX) 20 MG tablet Take 20 mg by mouth daily.   gabapentin (NEURONTIN) 300 MG capsule Take 300 mg by mouth 2 (two) times daily.    glimepiride (AMARYL) 2 MG tablet Take 2 mg by mouth in the morning and at bedtime.    HYDROcodone-acetaminophen (NORCO) 10-325 MG tablet Take 2 tablets by mouth in the morning and at bedtime.    insulin glargine (LANTUS) 100 UNIT/ML Solostar Pen Inject 65 Units into the skin once a week.   levothyroxine (SYNTHROID) 100 MCG tablet Take by mouth.   metFORMIN (GLUCOPHAGE) 500 MG tablet Take 1,000 mg by mouth 2 (two) times daily with a meal.    Multiple Vitamins-Minerals (MULTIVITAMIN WITH MINERALS) tablet Take 1 tablet by mouth daily.   nabumetone (RELAFEN) 750 MG  tablet Take 2 tablets by mouth daily.   ONETOUCH VERIO test strip SMARTSIG:Via Meter   OZEMPIC, 0.25 OR 0.5 MG/DOSE, 2 MG/3ML SOPN Inject 0.5 mg into the skin once a week.   quiNINE (QUALAQUIN) 324 MG capsule Take 1 capsule by mouth daily.    spironolactone (ALDACTONE) 50 MG tablet Take 50 mg by mouth daily.   TRELEGY ELLIPTA 100-62.5-25 MCG/ACT AEPB INHALE 1 PUFF INTO THE LUNGS DAILY   No facility-administered encounter medications on file as of 12/02/2022.    Surgical History: Past Surgical History:  Procedure Laterality Date   ANTERIOR CERVICAL DECOMP/DISCECTOMY FUSION N/A 07/18/2015   Procedure: CERVICALFIVE-SIX, CERVICAL SIX-SEVEN ANTERIOR CERVICAL DECOMPRESSION/DISCECTOMY FUSION;  Surgeon: Tressie Stalker, MD;  Location: [facility];  Service: Neurosurgery;  Laterality: N/A;  C56 C67 anterior cervical decompression with fusion interbody prosthesis plating and bonegraft   COLONOSCOPY     COLONOSCOPY WITH PROPOFOL N/A 08/03/2019   Procedure: COLONOSCOPY WITH PROPOFOL;  Surgeon: Toledo, Boykin Nearing, MD;  Location: [facility];  Service: Gastroenterology;  Laterality: N/A;   ESOPHAGOGASTRODUODENOSCOPY (EGD) WITH PROPOFOL N/A 08/03/2019   Procedure: ESOPHAGOGASTRODUODENOSCOPY (EGD) WITH PROPOFOL;  Surgeon: Toledo, Boykin Nearing, MD;  Location: [facility];  Service: Gastroenterology;  Laterality: N/A;   leg stint     nov 2021   LOWER EXTREMITY ANGIOGRAPHY Left 02/07/2020   Procedure: LOWER EXTREMITY ANGIOGRAPHY;  Surgeon: Renford Dills, MD;  Location: [facility];  Service: Cardiovascular;  Laterality: Left;  RECTAL POLYPECTOMY  4-5 yrs ago    Medical History: Past Medical History:  Diagnosis Date   Arthritis    DDD- lumbar , thumbs - both hands    COPD (chronic obstructive pulmonary disease) (HCC)    Depression    Diabetes mellitus without complication (HCC)    GERD (gastroesophageal reflux disease)    History of kidney stones 1990's    lithotripsy done    Hypertension     Hypothyroidism    Sleep apnea 01/30/2016   [facility] in [place], results not avail to pt. yet    Family History: Family History  Problem Relation Age of Onset   Diabetes Mother    Asthma Mother    Hypertension Mother    Hyperlipidemia Mother    Hyperlipidemia Father    Dementia Father    Hypertension Father    Heart Problems Father        pace maker    Social History   Socioeconomic History   Marital status: Married    Spouse name: Not on file   Number of children: Not on file   Years of education: Not on file   Highest education level: Not on file  Occupational History   Not on file  Tobacco Use   Smoking status: Every Day    Types: E-cigarettes    Last attempt to quit: 05/2016    Years since quitting: 6.5   Smokeless tobacco: Never   Tobacco comments:    Pt uses a vape 10/22/21  Vaping Use   Vaping status: Every Day   Substances: Nicotine  Substance and Sexual Activity   Alcohol use: Yes    Alcohol/week: 3.0 standard drinks of alcohol    Types: 3 Cans of beer per week    Comment: daily    Drug use: No   Sexual activity: Not on file  Other Topics Concern   Not on file  Social History Narrative   Not on file   Social Determinants of Health   Financial Resource Strain: Not on file  Food Insecurity: Not on file  Transportation Needs: Not on file  Physical Activity: Not on file  Stress: Not on file  Social Connections: Not on file  Intimate Partner Violence: Not on file      Review of Systems  Constitutional:  Negative for chills, fatigue and unexpected weight change.  HENT:  Negative for congestion, rhinorrhea, sneezing and sore throat.   Eyes:  Negative for redness.  Respiratory: Negative.  Negative for cough, chest tightness, shortness of breath and wheezing.   Cardiovascular: Negative.  Negative for chest pain and palpitations.  Gastrointestinal:  Negative for abdominal pain, constipation, diarrhea, nausea and vomiting.   Genitourinary:  Negative for dysuria and frequency.  Musculoskeletal:  Negative for arthralgias, back pain, joint swelling and neck pain.  Skin:  Negative for rash.  Neurological: Negative.  Negative for tremors and numbness.  Hematological:  Negative for adenopathy. Does not bruise/bleed easily.  Psychiatric/Behavioral:  Negative for behavioral problems (Depression), sleep disturbance and suicidal ideas. The patient is not nervous/anxious.     Vital Signs: BP 122/68   Pulse 77   Temp 98.1 F (36.7 C)   Resp 16   Ht 5\' 5"  (1.651 m)   Wt 212 lb 3.2 oz (96.3 kg)   SpO2 93%   BMI 35.31 kg/m    Physical Exam Vitals reviewed.  Constitutional:      General: He is not in acute distress.  Appearance: Normal appearance. He is obese. He is not ill-appearing.  HENT:     Head: Normocephalic and atraumatic.  Eyes:     Pupils: Pupils are equal, round, and reactive to light.  Cardiovascular:     Rate and Rhythm: Normal rate and regular rhythm.     Heart sounds: Normal heart sounds. No murmur heard. Pulmonary:     Effort: Pulmonary effort is normal. No respiratory distress.     Breath sounds: Normal breath sounds. No wheezing.  Neurological:     Mental Status: He is alert and oriented to person, place, and time.  Psychiatric:        Mood and Affect: Mood normal.        Behavior: Behavior normal.        Assessment/Plan: 1. Obstructive chronic bronchitis without exacerbation Repeat PFT in 6 months then follow up to discuss results  - Pulmonary function test; Future  2. SOB (shortness of breath) PFT in 6 months then will follow up to discuss results.  - Pulmonary function test; Future  3. Personal history of tobacco use, presenting hazards to health Repeat CT chest in 6 months    General Counseling: Darin Waters verbalizes understanding of the findings of todays visit and agrees with plan of treatment. I have discussed any further diagnostic evaluation that may be needed or  ordered today. We also reviewed his medications today. he has been encouraged to call the office with any questions or concerns that should arise related to todays visit.    Orders Placed This Encounter  Procedures   Pulmonary function test    No orders of the defined types were placed in this encounter.   Return in about 6 months (around 06/01/2023) for need PFT in february and F/U appt in march for pulmonary only/PFT results Bland Rudzinski or [facility].   Total time spent:30 Minutes Time spent includes review of chart, medications, test results, and follow up plan with the patient.   [place] Controlled Substance Database was reviewed by me.  This patient was seen by Sallyanne Kuster, FNP-C in collaboration with Dr. Beverely Risen as a part of collaborative care agreement.   Makinzi Prieur R. Tedd Sias, MSN, FNP-C Internal medicine

## 2023-03-27 ENCOUNTER — Other Ambulatory Visit: Payer: Self-pay | Admitting: Acute Care

## 2023-03-27 DIAGNOSIS — Z122 Encounter for screening for malignant neoplasm of respiratory organs: Secondary | ICD-10-CM

## 2023-03-27 DIAGNOSIS — Z87891 Personal history of nicotine dependence: Secondary | ICD-10-CM

## 2023-05-04 ENCOUNTER — Encounter: Payer: Medicare HMO | Admitting: Internal Medicine

## 2023-05-06 ENCOUNTER — Encounter: Payer: Medicare HMO | Admitting: Internal Medicine

## 2023-05-11 ENCOUNTER — Ambulatory Visit
Admission: RE | Admit: 2023-05-11 | Discharge: 2023-05-11 | Disposition: A | Discharge status: Home / Self Care | Payer: Medicare HMO | Source: Ambulatory Visit | Attending: Acute Care | Admitting: Acute Care

## 2023-05-11 DIAGNOSIS — Z87891 Personal history of nicotine dependence: Secondary | ICD-10-CM

## 2023-05-11 DIAGNOSIS — Z122 Encounter for screening for malignant neoplasm of respiratory organs: Secondary | ICD-10-CM

## 2023-05-13 ENCOUNTER — Telehealth: Payer: Self-pay

## 2023-05-13 NOTE — Telephone Encounter (Signed)
LVM to reschedule patient's appt for PFT on 05/20/23.

## 2023-05-14 ENCOUNTER — Telehealth: Payer: Self-pay | Admitting: Nurse Practitioner

## 2023-05-14 NOTE — Telephone Encounter (Signed)
Lvm & sent mychart msg to move 05/20/23 appointment-Toni

## 2023-05-20 ENCOUNTER — Encounter: Payer: Medicare HMO | Admitting: Internal Medicine

## 2023-05-25 ENCOUNTER — Other Ambulatory Visit: Payer: Self-pay

## 2023-05-25 DIAGNOSIS — Z87891 Personal history of nicotine dependence: Secondary | ICD-10-CM

## 2023-05-25 DIAGNOSIS — Z122 Encounter for screening for malignant neoplasm of respiratory organs: Secondary | ICD-10-CM

## 2023-06-01 ENCOUNTER — Ambulatory Visit: Payer: Medicare HMO | Admitting: Nurse Practitioner

## 2023-07-01 ENCOUNTER — Ambulatory Visit: Admitting: Urology

## 2023-07-01 VITALS — BP 145/77 | HR 60 | Ht 65.0 in | Wt 228.2 lb

## 2023-07-01 DIAGNOSIS — N5201 Erectile dysfunction due to arterial insufficiency: Secondary | ICD-10-CM | POA: Diagnosis not present

## 2023-07-01 MED ORDER — TADALAFIL 10 MG PO TABS: 10.0000 mg | ORAL_TABLET | Freq: Every day | ORAL | 11 refills | Status: AC

## 2023-07-01 NOTE — Progress Notes (Signed)
 07/01/23 2:25 PM   Darin Waters 10/03/1960 [id number]  CC: Erectile dysfunction  HPI: 63 year old male with a number of comorbidities including obesity with BMI of 38, diabetes, tobacco use who presents with ED.  His wife passed away in 01/31/2024, and he has a new partner and has had trouble maintaining erections.  He is able to achieve orgasm through masturbation.  Currently using 50 to 100 mg of sildenafil with minimal improvement.  No prior testosterone values to review.   PMH: Past Medical History:  Diagnosis Date   Arthritis    DDD- lumbar , thumbs - both hands    COPD (chronic obstructive pulmonary disease) (HCC)    Depression    Diabetes mellitus without complication (HCC)    GERD (gastroesophageal reflux disease)    History of kidney stones 1990's    lithotripsy done    Hypertension    Hypothyroidism    Sleep apnea 01/30/2016   [facility] [place], results not avail to pt. yet    Surgical History: Past Surgical History:  Procedure Laterality Date   ANTERIOR CERVICAL DECOMP/DISCECTOMY FUSION N/A 07/18/2015   Procedure: CERVICALFIVE-SIX, CERVICAL SIX-SEVEN ANTERIOR CERVICAL DECOMPRESSION/DISCECTOMY FUSION;  Surgeon: Tressie Stalker, MD;  Location: [facility];  Service: Neurosurgery;  Laterality: N/A;  C56 C67 anterior cervical decompression with fusion interbody prosthesis plating and bonegraft   COLONOSCOPY     COLONOSCOPY WITH PROPOFOL N/A 08/03/2019   Procedure: COLONOSCOPY WITH PROPOFOL;  Surgeon: Toledo, Boykin Nearing, MD;  Location: [facility];  Service: Gastroenterology;  Laterality: N/A;   ESOPHAGOGASTRODUODENOSCOPY (EGD) WITH PROPOFOL N/A 08/03/2019   Procedure: ESOPHAGOGASTRODUODENOSCOPY (EGD) WITH PROPOFOL;  Surgeon: Toledo, Boykin Nearing, MD;  Location: [facility];  Service: Gastroenterology;  Laterality: N/A;   leg stint     nov 2021   LOWER EXTREMITY ANGIOGRAPHY Left 02/07/2020   Procedure: LOWER EXTREMITY ANGIOGRAPHY;  Surgeon: Renford Dills, MD;  Location: [facility];  Service: Cardiovascular;  Laterality: Left;   RECTAL POLYPECTOMY  4-5 yrs ago     Family History: Family History  Problem Relation Age of Onset   Diabetes Mother    Asthma Mother    Hypertension Mother    Hyperlipidemia Mother    Hyperlipidemia Father    Dementia Father    Hypertension Father    Heart Problems Father        pace maker    Social History:  reports that he has been smoking e-cigarettes. He has never used smokeless tobacco. He reports current alcohol use of about 3.0 standard drinks of alcohol per week. He reports that he does not use drugs.  Physical Exam: BP (!) 145/77 (BP Location: Left Arm, Patient Position: Sitting, Cuff Size: Normal)   Pulse 60   Ht 5\' 5"  (1.651 m)   Wt 228 lb 3.2 oz (103.5 kg)   SpO2 96%   BMI 37.97 kg/m    Constitutional:  Alert and oriented, No acute distress. Cardiovascular: No clubbing, cyanosis, or edema. Respiratory: Normal respiratory effort, no increased work of breathing. 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  We reviewed the AUA guidelines regarding ED, and I recommended a trial of Cialis 10 mg daily with a 10 mg boost dose as needed.  Behavioral and lifestyle strategies discussed as well.  Trial of Cialis 10 mg daily with 10 mg boost dose as needed Check morning testosterone level RTC 4  to 6 weeks symptom check, he is interested in penile injections at that time if no improvement on the Cialis   Legrand Rams, MD 07/01/2023  [facility] [address] [place], [place] [address] [phone]

## 2023-07-01 NOTE — Patient Instructions (Signed)

## 2023-07-06 ENCOUNTER — Other Ambulatory Visit
Admission: RE | Admit: 2023-07-06 | Discharge: 2023-07-06 | Disposition: A | Discharge status: Home / Self Care | Attending: Urology | Admitting: Urology

## 2023-07-06 DIAGNOSIS — N5201 Erectile dysfunction due to arterial insufficiency: Secondary | ICD-10-CM | POA: Diagnosis present

## 2023-07-07 ENCOUNTER — Telehealth (INDEPENDENT_AMBULATORY_CARE_PROVIDER_SITE_OTHER): Payer: Self-pay

## 2023-07-07 LAB — TESTOSTERONE: Testosterone: 201 ng/dL — ABNORMAL LOW (ref 264–916)

## 2023-07-07 NOTE — Telephone Encounter (Signed)
 It could be arthritis, but we can move his appointment up as available on the schedule

## 2023-07-07 NOTE — Telephone Encounter (Signed)
 Patient left a message stating that he been having left leg pain for past several weeks when walking. Patient saw his PCP and she recommended that it was arthritis. Patient was last seen 09/12/22 and has upcoming appointment 09/14/23. Patient requesting to be seen sooner. Please Advise

## 2023-07-07 NOTE — Telephone Encounter (Signed)
Please contact patient to schedule appointment.

## 2023-07-08 ENCOUNTER — Telehealth: Payer: Self-pay | Admitting: Internal Medicine

## 2023-07-08 NOTE — Telephone Encounter (Signed)
 Left vm and sent mychart message to confirm 07/15/23 appointment-Toni

## 2023-07-15 ENCOUNTER — Ambulatory Visit: Payer: Medicare HMO | Admitting: Internal Medicine

## 2023-07-15 DIAGNOSIS — R0602 Shortness of breath: Secondary | ICD-10-CM

## 2023-07-20 NOTE — Procedures (Signed)
 [facility] [address] [place], [address]    Complete Pulmonary Function Testing Interpretation:  FINDINGS:   The forced vital capacity is mildly decreased.  FEV1 is normal.  FEV1 FVC ratio is normal.  Post bronchodilator there is no significant change in the FEV1.  Total lung capacity is mildly decreased.  Residual volume is decreased.  FRC is decreased.  The DLCO is normal.  IMPRESSION:    This pulmonary function study is suggestive of very mild restrictive lung disease clinical correlation is recommended.  Cordie Deters, MD [facility] Pulmonary Critical Care Medicine Sleep Medicine

## 2023-07-28 LAB — PULMONARY FUNCTION TEST

## 2023-07-30 ENCOUNTER — Encounter: Payer: Self-pay | Admitting: Nurse Practitioner

## 2023-07-30 ENCOUNTER — Ambulatory Visit (INDEPENDENT_AMBULATORY_CARE_PROVIDER_SITE_OTHER): Admitting: Nurse Practitioner

## 2023-07-30 VITALS — BP 134/68 | HR 61 | Temp 98.8°F | Resp 16 | Ht 65.0 in | Wt 228.2 lb

## 2023-07-30 DIAGNOSIS — J4489 Other specified chronic obstructive pulmonary disease: Secondary | ICD-10-CM | POA: Diagnosis not present

## 2023-07-30 DIAGNOSIS — Z87891 Personal history of nicotine dependence: Secondary | ICD-10-CM | POA: Diagnosis not present

## 2023-07-30 DIAGNOSIS — R0602 Shortness of breath: Secondary | ICD-10-CM

## 2023-07-30 MED ORDER — TRELEGY ELLIPTA 100-62.5-25 MCG/ACT IN AEPB: 1.0000 | INHALATION_SPRAY | Freq: Every day | RESPIRATORY_TRACT | 11 refills | Status: AC

## 2023-07-30 NOTE — Progress Notes (Signed)
 Darin Waters [address] [place], [place] [address]  Internal MEDICINE  Office Visit Note  Patient Name: Darin Waters  [id number]  [id number]  Date of Service: 07/30/2023  Chief Complaint  Patient presents with   Follow-up    Review pft    HPI [name] presents for a follow-up visit for COPD and PFT results.  PFT results -- mild restrictive, slightly improved. No significant change from last year  Lung cancer screening done in February this year-- no nodule found, lung-rads 1 negative.  Uses his rescue inhaler infrequently. Takes trelegy daily which controls his symptoms well. Due for trelegy refills  Denies any significant SOB, chest tightness, cough, or wheezing.     Current Medication: Outpatient Encounter Medications as of 07/30/2023  Medication Sig   albuterol  (VENTOLIN  HFA) 108 (90 Base) MCG/ACT inhaler Inhale 2 puffs into the lungs every 6 (six) hours as needed for wheezing or shortness of breath.   amLODipine -benazepril  (LOTREL) 10-40 MG capsule Take 1 capsule by mouth daily before breakfast.    ASPIRIN  LOW DOSE 81 MG tablet Take 81 mg by mouth daily.   atorvastatin (LIPITOR) 40 MG tablet Take 40 mg by mouth daily.    B-D ULTRAFINE III SHORT PEN 31G X 8 MM MISC SMARTSIG:1 Each SUB-Q Daily   BD PEN NEEDLE MICRO U/F 32G X 6 MM MISC Inject into the skin daily.   citalopram  (CELEXA ) 10 MG tablet Take 10 mg by mouth daily before breakfast.    esomeprazole (NEXIUM) 20 MG capsule Take 20 mg by mouth daily before breakfast.   furosemide (LASIX) 20 MG tablet Take 20 mg by mouth daily.   gabapentin  (NEURONTIN ) 300 MG capsule Take 300 mg by mouth 2 (two) times daily.    glimepiride  (AMARYL ) 2 MG tablet Take 2 mg by mouth in the morning and at bedtime.    insulin  glargine (LANTUS) 100 UNIT/ML Solostar Pen Inject 65 Units into the skin once a week.   Insulin  Pen Needle (RAYA SURE PEN NEEDLE) 31G X 6 MM MISC Use 1 each once daily With insulin  pen   levothyroxine  (SYNTHROID) 75 MCG tablet Take 75 mcg by mouth daily before breakfast.   metFORMIN  (GLUCOPHAGE ) 500 MG tablet Take 1,000 mg by mouth 2 (two) times daily with a meal.    Multiple Vitamins-Minerals (MULTIVITAMIN WITH MINERALS) tablet Take 1 tablet by mouth daily.   nabumetone (RELAFEN) 750 MG tablet Take 2 tablets by mouth daily.   OZEMPIC, 1 MG/DOSE, 4 MG/3ML SOPN Inject 1 mg into the skin once a week.   sildenafil (VIAGRA) 50 MG tablet Take 50 mg by mouth daily.   spironolactone (ALDACTONE) 50 MG tablet Take 50 mg by mouth daily.   tadalafil  (CIALIS ) 10 MG tablet Take 1 tablet (10 mg total) by mouth daily. Take additional 10mg  boost dose 45 minutes prior to sexual activity   [DISCONTINUED] TRELEGY ELLIPTA  100-62.5-25 MCG/ACT AEPB INHALE 1 PUFF INTO THE LUNGS DAILY   Fluticasone -Umeclidin-Vilant (TRELEGY ELLIPTA ) 100-62.5-25 MCG/ACT AEPB Inhale 1 puff into the lungs daily.   No facility-administered encounter medications on file as of 07/30/2023.    Surgical History: Past Surgical History:  Procedure Laterality Date   ANTERIOR CERVICAL DECOMP/DISCECTOMY FUSION N/A 07/18/2015   Procedure: CERVICALFIVE-SIX, CERVICAL SIX-SEVEN ANTERIOR CERVICAL DECOMPRESSION/DISCECTOMY FUSION;  Surgeon: Garry Kansas, MD;  Location: [facility];  Service: Neurosurgery;  Laterality: N/A;  C56 C67 anterior cervical decompression with fusion interbody prosthesis plating and bonegraft   COLONOSCOPY     COLONOSCOPY  WITH PROPOFOL  N/A 08/03/2019   Procedure: COLONOSCOPY WITH PROPOFOL ;  Surgeon: Toledo, Alphonsus Jeans, MD;  Location: [facility] ENDOSCOPY;  Service: Gastroenterology;  Laterality: N/A;   ESOPHAGOGASTRODUODENOSCOPY (EGD) WITH PROPOFOL  N/A 08/03/2019   Procedure: ESOPHAGOGASTRODUODENOSCOPY (EGD) WITH PROPOFOL ;  Surgeon: Toledo, Alphonsus Jeans, MD;  Location: [facility] ENDOSCOPY;  Service: Gastroenterology;  Laterality: N/A;   leg stint     nov 2021   LOWER EXTREMITY ANGIOGRAPHY Left 02/07/2020   Procedure: LOWER EXTREMITY  ANGIOGRAPHY;  Surgeon: Jackquelyn Mass, MD;  Location: [facility] INVASIVE CV LAB;  Service: Cardiovascular;  Laterality: Left;   RECTAL POLYPECTOMY  4-5 yrs ago    Medical History: Past Medical History:  Diagnosis Date   Arthritis    DDD- lumbar , thumbs - both hands    COPD (chronic obstructive pulmonary disease) (HCC)    Depression    Diabetes mellitus without complication (HCC)    GERD (gastroesophageal reflux disease)    History of kidney stones 1990's    lithotripsy done    Hypertension    Hypothyroidism    Sleep apnea 01/30/2016   [facility] in [place], results not avail to pt. yet    Family History: Family History  Problem Relation Age of Onset   Diabetes Mother    Asthma Mother    Hypertension Mother    Hyperlipidemia Mother    Hyperlipidemia Father    Dementia Father    Hypertension Father    Heart Problems Father        pace maker    Social History   Socioeconomic History   Marital status: Widowed    Spouse name: Not on file   Number of children: Not on file   Years of education: Not on file   Highest education level: Not on file  Occupational History   Not on file  Tobacco Use   Smoking status: Every Day    Types: E-cigarettes    Last attempt to quit: 05/2016    Years since quitting: 7.2   Smokeless tobacco: Never   Tobacco comments:    Pt uses a vape 10/22/21  Vaping Use   Vaping status: Every Day   Substances: Nicotine  Substance and Sexual Activity   Alcohol use: Yes    Alcohol/week: 3.0 standard drinks of alcohol    Types: 3 Cans of beer per week    Comment: daily    Drug use: No   Sexual activity: Not on file  Other Topics Concern   Not on file  Social History Narrative   Not on file   Social Drivers of Health   Financial Resource Strain: Low Risk  (06/05/2023)   Received from [facility]   Overall Financial Resource Strain (CARDIA)    Difficulty of Paying Living Expenses: Not very hard  Recent Concern:  Financial Resource Strain - Medium Risk (05/14/2023)   Received from [facility]   Overall Financial Resource Strain (CARDIA)    Difficulty of Paying Living Expenses: Somewhat hard  Food Insecurity: Food Insecurity Present (06/05/2023)   Received from [facility]   Hunger Vital Sign    Worried About Running Out of Food in the Last Year: Sometimes true    Ran Out of Food in the Last Year: Sometimes true  Transportation Needs: No Transportation Needs (06/05/2023)   Received from [facility] - Transportation    In the past 12 months, has lack of transportation kept you from medical  appointments or from getting medications?: No    Lack of Transportation (Non-Medical): No  Physical Activity: Not on file  Stress: Not on file  Social Connections: Not on file  Intimate Partner Violence: Not on file      Review of Systems  Constitutional:  Negative for chills, fatigue and unexpected weight change.  HENT:  Negative for congestion, rhinorrhea, sneezing and sore throat.   Eyes:  Negative for redness.  Respiratory: Negative.  Negative for cough, chest tightness, shortness of breath and wheezing.   Cardiovascular: Negative.  Negative for chest pain and palpitations.  Gastrointestinal:  Negative for abdominal pain, constipation, diarrhea, nausea and vomiting.  Genitourinary:  Negative for dysuria and frequency.  Musculoskeletal:  Negative for arthralgias, back pain, joint swelling and neck pain.  Skin:  Negative for rash.  Neurological: Negative.  Negative for tremors and numbness.  Hematological:  Negative for adenopathy. Does not bruise/bleed easily.  Psychiatric/Behavioral:  Negative for behavioral problems (Depression), sleep disturbance and suicidal ideas. The patient is not nervous/anxious.     Vital Signs: BP 134/68   Pulse 61   Temp 98.8 F (37.1 C)   Resp 16   Ht 5\' 5"  (1.651 m)   Wt 228 lb 3.2 oz (103.5 kg)   SpO2 93%    BMI 37.97 kg/m    Physical Exam Vitals reviewed.  Constitutional:      General: He is not in acute distress.    Appearance: Normal appearance. He is obese. He is not ill-appearing.  HENT:     Head: Normocephalic and atraumatic.  Eyes:     Pupils: Pupils are equal, round, and reactive to light.  Cardiovascular:     Rate and Rhythm: Normal rate and regular rhythm.     Heart sounds: Normal heart sounds. No murmur heard. Pulmonary:     Effort: Pulmonary effort is normal. No respiratory distress.     Breath sounds: Normal breath sounds. No wheezing.  Neurological:     Mental Status: He is alert and oriented to person, place, and time.  Psychiatric:        Mood and Affect: Mood normal.        Behavior: Behavior normal.        Assessment/Plan: 1. Obstructive chronic bronchitis without exacerbation Repeat PFT in 1 year. Continue trelegy as prescribed.  - Fluticasone -Umeclidin-Vilant (TRELEGY ELLIPTA ) 100-62.5-25 MCG/ACT AEPB; Inhale 1 puff into the lungs daily.  Dispense: 60 each; Refill: 11  2. SOB (shortness of breath) PFT in 1 year, continue trelegy as prescribed. Continue prn albuterol    3. Personal history of tobacco use, presenting hazards to health Repeat CT chest in February next year.    General Counseling: Kadarius verbalizes understanding of the findings of todays visit and agrees with plan of treatment. I have discussed any further diagnostic evaluation that may be needed or ordered today. We also reviewed his medications today. he has been encouraged to call the office with any questions or concerns that should arise related to todays visit.    No orders of the defined types were placed in this encounter.   Meds ordered this encounter  Medications   Fluticasone -Umeclidin-Vilant (TRELEGY ELLIPTA ) 100-62.5-25 MCG/ACT AEPB    Sig: Inhale 1 puff into the lungs daily.    Dispense:  60 each    Refill:  11    Return in about 1 year (around 07/29/2024) for F/U,  pulmonary only, Maddock Finigan, PFT @ [place] need annual PFT prior to f/u office visit.   Total  time spent:20 Minutes Time spent includes review of chart, medications, test results, and follow up plan with the patient.   [place] Controlled Substance Database was reviewed by me.  This patient was seen by Laurence Pons, FNP-C in collaboration with Dr. Verneta Gone as a part of collaborative care agreement.   Alliah Boulanger R. Bobbi Burow, MSN, FNP-C Internal medicine

## 2023-08-03 ENCOUNTER — Ambulatory Visit: Admitting: Urology

## 2023-08-10 ENCOUNTER — Ambulatory Visit: Admitting: Urology

## 2023-08-10 ENCOUNTER — Encounter: Payer: Self-pay | Admitting: Urology

## 2023-08-10 VITALS — BP 108/58 | HR 64 | Ht 65.0 in | Wt 228.0 lb

## 2023-08-10 DIAGNOSIS — N5201 Erectile dysfunction due to arterial insufficiency: Secondary | ICD-10-CM

## 2023-08-10 MED ORDER — NONFORMULARY OR COMPOUNDED ITEM: 0 refills | Status: AC

## 2023-08-10 NOTE — Patient Instructions (Addendum)
 Trimix Injection Training  The provider will prescribe the Trimix medication to [facility].  You will need to pick up the medication at:   [address]  [place], [place] [address] [phone]  Do not pick up the medication more than 1 week prior to your appointment. The medication will induce an erection. The medication is estimated at $80.00. This medication is time sensitive as it expires quickly.   You will need to have a normal blood pressure in order to be injected. Your blood pressure must be under 140/90. We will check your blood pressure multiple times to ensure the reading is correct, if you BP continues to be elevated we will need to reschedule your appointment.

## 2023-08-10 NOTE — Progress Notes (Signed)
 08/10/2023 4:03 PM   Darin Waters [birth year] [id number]  Referring provider: Deliah Fells, NP [address] [place],  [place] [address]  Urological history: 1.  Erectile dysfunction - Not at goal with the 100 mg of sildenafil - Serum testosterone  level (06/2023) 201   Chief Complaint  Patient presents with   Erectile Dysfunction   HPI: Darin Waters is a 63 y.o. man who presents today for one month follow up.    Previous records reviewed.   He was seen by Dr. Estanislao Heimlich on July 01, 2023 with complaints of erectile dysfunction.  He is not at goal with 100 mg of sildenafil.  He was switched to tadalafil  10 mg daily with a boost of 10 mg prior to intercourse.  An afternoon serum testosterone  was also obtained and was low at 201.  With the Adam questionnaire, he is not experiencing any decrease in libido, decrease in energy, decrease in strength and endurance, decrease in the loss of height, decrease in the enjoyment of life, he is not sad or grumpy, he has not noticed a recent deterioration in his ability to play sports, he is not falling asleep after dinner and is not having a recent deterioration in his work performance.  He is however noticing his erections are less strong.  I PSS 0/0  Patient denies any modifying or aggravating factors.  Patient denies any recent UTI's, gross hematuria, dysuria or suprapubic/flank pain.  Patient denies any fevers, chills, nausea or vomiting.     IPSS     Row Name 08/10/23 1500         International Prostate Symptom Score   How often have you had the sensation of not emptying your bladder? Not at All     How often have you had to urinate less than every two hours? Not at All     How often have you found you stopped and started again several times when you urinated? Not at All     How often have you found it difficult to postpone urination? Not at All     How often have you had a weak urinary stream? Not at All     How often  have you had to strain to start urination? Not at All     How many times did you typically get up at night to urinate? None     Total IPSS Score 0       Quality of Life due to urinary symptoms   If you were to spend the rest of your life with your urinary condition just the way it is now how would you feel about that? Delighted              Score:  1-7 Mild 8-19 Moderate 20-35 Severe   SHIM 10  He states "the pills do not work".  The last time he had a satisfactory erection was 15 years ago after he took a Cialis .  He states it worked just 1 time and when he took it after that it never worked again.  He took the tadalafil  10 mg daily and augmented it with the 10 mg Cialis  and it did not work.  He has been reading about "the shots' and would like to move onto that treatment modality.   SHIM     Row Name 08/10/23 1512         SHIM: Over the last 6 months:   How do you rate your  confidence that you could get and keep an erection? Moderate     When you had erections with sexual stimulation, how often were your erections hard enough for penetration (entering your partner)? Almost Never or Never     During sexual intercourse, how often were you able to maintain your erection after you had penetrated (entered) your partner? Almost Never or Never     During sexual intercourse, how difficult was it to maintain your erection to completion of intercourse? Difficult     When you attempted sexual intercourse, how often was it satisfactory for you? A Few Times (much less than half the time)       SHIM Total Score   SHIM 10              Score: 1-7 Severe ED 8-11 Moderate ED 12-16 Mild-Moderate ED 17-21 Mild ED 22-25 No ED    PMH: Past Medical History:  Diagnosis Date   Arthritis    DDD- lumbar , thumbs - both hands    COPD (chronic obstructive pulmonary disease) (HCC)    Depression    Diabetes mellitus without complication (HCC)    GERD (gastroesophageal reflux disease)     History of kidney stones 1990's    lithotripsy done    Hypertension    Hypothyroidism    Sleep apnea 01/30/2016   [facility] [place], results not avail to pt. yet    Surgical History: Past Surgical History:  Procedure Laterality Date   ANTERIOR CERVICAL DECOMP/DISCECTOMY FUSION N/A 07/18/2015   Procedure: CERVICALFIVE-SIX, CERVICAL SIX-SEVEN ANTERIOR CERVICAL DECOMPRESSION/DISCECTOMY FUSION;  Surgeon: Garry Kansas, MD;  Location: [facility];  Service: Neurosurgery;  Laterality: N/A;  C56 C67 anterior cervical decompression with fusion interbody prosthesis plating and bonegraft   COLONOSCOPY     COLONOSCOPY WITH PROPOFOL  N/A 08/03/2019   Procedure: COLONOSCOPY WITH PROPOFOL ;  Surgeon: Toledo, Alphonsus Jeans, MD;  Location: [facility];  Service: Gastroenterology;  Laterality: N/A;   ESOPHAGOGASTRODUODENOSCOPY (EGD) WITH PROPOFOL  N/A 08/03/2019   Procedure: ESOPHAGOGASTRODUODENOSCOPY (EGD) WITH PROPOFOL ;  Surgeon: Toledo, Alphonsus Jeans, MD;  Location: [facility];  Service: Gastroenterology;  Laterality: N/A;   leg stint     nov 2021   LOWER EXTREMITY ANGIOGRAPHY Left 02/07/2020   Procedure: LOWER EXTREMITY ANGIOGRAPHY;  Surgeon: Jackquelyn Mass, MD;  Location: [facility];  Service: Cardiovascular;  Laterality: Left;   RECTAL POLYPECTOMY  4-5 yrs ago    Home Medications:  Allergies as of 08/10/2023   No Known Allergies      Medication List        Accurate as of Aug 10, 2023  4:03 PM. If you have any questions, ask your nurse or doctor.          albuterol  108 (90 Base) MCG/ACT inhaler Commonly known as: VENTOLIN  HFA Inhale 2 puffs into the lungs every 6 (six) hours as needed for wheezing or shortness of breath.   amLODipine -benazepril  10-40 MG capsule Commonly known as: LOTREL Take 1 capsule by mouth daily before breakfast.   amoxicillin-clavulanate 875-125 MG tablet Commonly known as: AUGMENTIN Take 875 mg by mouth 2 (two) times daily.   Aspirin   Low Dose 81 MG tablet Generic drug: aspirin  EC Take 81 mg by mouth daily.   atorvastatin 40 MG tablet Commonly known as: LIPITOR Take 40 mg by mouth daily.   BD Pen Needle Micro U/F 32G X 6 MM Misc Generic drug: Insulin  Pen Needle Inject into the skin daily.   B-D ULTRAFINE  III SHORT PEN 31G X 8 MM Misc Generic drug: Insulin  Pen Needle SMARTSIG:1 Each SUB-Q Daily   Raya Sure Pen Needle 31G X 6 MM Misc Generic drug: Insulin  Pen Needle Use 1 each once daily With insulin  pen   citalopram  10 MG tablet Commonly known as: CELEXA  Take 10 mg by mouth daily before breakfast.   esomeprazole 20 MG capsule Commonly known as: NEXIUM Take 20 mg by mouth daily before breakfast.   furosemide 20 MG tablet Commonly known as: LASIX Take 20 mg by mouth daily.   gabapentin  300 MG capsule Commonly known as: NEURONTIN  Take 300 mg by mouth 2 (two) times daily.   glimepiride  2 MG tablet Commonly known as: AMARYL  Take 2 mg by mouth in the morning and at bedtime.   Lantus SoloStar 100 UNIT/ML Solostar Pen Generic drug: insulin  glargine Inject 58 Units into the skin at bedtime. What changed: Another medication with the same name was removed. Continue taking this medication, and follow the directions you see here.   levothyroxine 88 MCG tablet Commonly known as: SYNTHROID Take 88 mcg by mouth daily before breakfast. What changed: Another medication with the same name was removed. Continue taking this medication, and follow the directions you see here.   metFORMIN  500 MG tablet Commonly known as: GLUCOPHAGE  Take 1,000 mg by mouth 2 (two) times daily with a meal.   multivitamin with minerals tablet Take 1 tablet by mouth daily.   nabumetone 750 MG tablet Commonly known as: RELAFEN Take 2 tablets by mouth daily.   NONFORMULARY OR COMPOUNDED ITEM Trimix (30/1/10)-(Pap/Phent/PGE)  Test Dose  1ml vial   Qty #3 Refills 0  [facility] [phone] Fax [phone]    Semaglutide (2 MG/DOSE) 8 MG/3ML Sopn Inject 2 mg into the skin once a week. What changed: Another medication with the same name was removed. Continue taking this medication, and follow the directions you see here.   sildenafil 50 MG tablet Commonly known as: VIAGRA Take 50 mg by mouth daily.   spironolactone 50 MG tablet Commonly known as: ALDACTONE Take 50 mg by mouth daily.   tadalafil  10 MG tablet Commonly known as: Cialis  Take 1 tablet (10 mg total) by mouth daily. Take additional 10mg  boost dose 45 minutes prior to sexual activity   Trelegy Ellipta  100-62.5-25 MCG/ACT Aepb Generic drug: Fluticasone -Umeclidin-Vilant Inhale 1 puff into the lungs daily.        Allergies: No Known Allergies  Family History: Family History  Problem Relation Age of Onset   Diabetes Mother    Asthma Mother    Hypertension Mother    Hyperlipidemia Mother    Hyperlipidemia Father    Dementia Father    Hypertension Father    Heart Problems Father        pace maker    Social History:  reports that he has been smoking e-cigarettes. He has never used smokeless tobacco. He reports current alcohol use of about 3.0 standard drinks of alcohol per week. He reports that he does not use drugs.  ROS: Pertinent ROS in HPI  Physical Exam: BP (!) 108/58 (BP Location: Left Arm, Patient Position: Sitting, Cuff Size: Large)   Pulse 64   Ht 5\' 5"  (1.651 m)   Wt 228 lb (103.4 kg)   SpO2 94%   BMI 37.94 kg/m   Constitutional:  Well nourished. Alert and oriented, No acute distress. HEENT: [place] AT, moist mucus membranes.  Trachea midline Cardiovascular: No clubbing, cyanosis, or edema. Respiratory: Normal respiratory effort,  no increased work of breathing. Neurologic: Grossly intact, no focal deficits, moving all 4 extremities. Psychiatric: Normal mood and affect.  Laboratory Data: Lab Results  Component Value Date   TESTOSTERONE  201 (L) 07/06/2023   igh    Resulting Agency [facility]   Specimen Collected: 07/28/23 14:10   Performed by: [facility] Last Resulted: 07/29/23 14:33  Received From: [facility]  Result Received: 07/30/23 09:54    Hemoglobin A1C Order: [id number] Component Ref Range & Units 2 mo ago  Hemoglobin A1C 4.2 - 5.6 % 6.7 High   Average Blood Glucose (Calc) mg/dL 409  Resulting Agency [facility]  Narrative Performed by [facility] Normal Range:    4.2 - 5.6% Increased Risk:  5.7 - 6.4% Diabetes:        >= 6.5% Glycemic Control for adults with diabetes:  <7%    Specimen Collected: 06/05/23 15:05   Performed by: [facility] Last Resulted: 06/08/23 12:48  Received From: [facility]  Result Received: 07/01/23 14:28    ontains abnormal data Comprehensive Metabolic Panel (CMP) Order: [id number] Component Ref Range & Units 2 mo ago  Glucose 70 - 110 mg/dL 956  Sodium 213 - 086 mmol/L 142  Potassium 3.6 - 5.1 mmol/L 5.4 High   Chloride 97 - 109 mmol/L 103  Carbon Dioxide (CO2) 22.0 - 32.0 mmol/L 25.3  Urea  Nitrogen (BUN) 7 - 25 mg/dL 24  Creatinine 0.7 - 1.3 mg/dL 2 High   Glomerular Filtration Rate (eGFR) >60 mL/min/1.73sq m 37 Low   Comment: CKD-EPI (2021) does not include patient's race in the calculation of eGFR.  Monitoring changes of plasma creatinine and eGFR over time is useful for monitoring kidney function.  Interpretive Ranges for eGFR (CKD-EPI 2021):  eGFR:       >60 mL/min/1.73 sq. m - Normal eGFR:       30-59 mL/min/1.73 sq. m - Moderately Decreased eGFR:       15-29 mL/min/1.73 sq. m  - Severely Decreased eGFR:       < 15 mL/min/1.73 sq. m  - Kidney Failure   Note: These eGFR calculations do not apply in acute situations when eGFR is changing rapidly or patients on dialysis.  Calcium 8.7 - 10.3 mg/dL 11 High   AST 8 - 39 U/L 26  ALT 6 - 57 U/L 25  Alk Phos (alkaline Phosphatase) 34 - 104 U/L 87   Albumin 3.5 - 4.8 g/dL 4.9 High   Bilirubin, Total 0.3 - 1.2 mg/dL 0.4  Protein, Total 6.1 - 7.9 g/dL 8.1 High   A/G Ratio 1.0 - 5.0 gm/dL 1.5  Resulting Agency [facility]   Specimen Collected: 06/05/23 15:05   Performed by: [facility] Last Resulted: 06/08/23 14:55  Received From: [facility]  Result Received: 07/01/23 14:28  I have reviewed the labs.   Pertinent Imaging: N/A  Assessment & Plan:    1. ED - Explained that his testosterone  level was low, but it was an afternoon level and we need to repeat it before 9 AM on 2 separate occasions at least 2 days apart for confirmation - I did advise him that if he is found to be hypogonadal and even if we get his testosterone  through a therapeutic level, he still may have issues with erections - He would like to move forward with [facility]CI -  [facility], I advised him not to pick the prescription up more than 1 week prior to the appointment   Return for Trimx titration,  2 morning testosterone  .  These notes generated with voice recognition software. I apologize for typographical errors.  Briant Camper  [facility] [address]  [address] [place], [place] [address] [phone]

## 2023-08-11 ENCOUNTER — Other Ambulatory Visit
Admission: RE | Admit: 2023-08-11 | Discharge: 2023-08-11 | Disposition: A | Discharge status: Home / Self Care | Attending: Urology | Admitting: Urology

## 2023-08-11 DIAGNOSIS — N5201 Erectile dysfunction due to arterial insufficiency: Secondary | ICD-10-CM | POA: Insufficient documentation

## 2023-08-12 ENCOUNTER — Other Ambulatory Visit: Payer: Self-pay | Admitting: Nephrology

## 2023-08-12 DIAGNOSIS — R809 Proteinuria, unspecified: Secondary | ICD-10-CM

## 2023-08-12 DIAGNOSIS — N1832 Chronic kidney disease, stage 3b: Secondary | ICD-10-CM

## 2023-08-12 LAB — TESTOSTERONE: Testosterone: 240 ng/dL — ABNORMAL LOW (ref 264–916)

## 2023-08-13 ENCOUNTER — Ambulatory Visit: Payer: Self-pay | Admitting: Urology

## 2023-08-13 DIAGNOSIS — N5201 Erectile dysfunction due to arterial insufficiency: Secondary | ICD-10-CM

## 2023-08-18 ENCOUNTER — Encounter (INDEPENDENT_AMBULATORY_CARE_PROVIDER_SITE_OTHER): Payer: Self-pay

## 2023-08-18 ENCOUNTER — Other Ambulatory Visit
Admission: RE | Admit: 2023-08-18 | Discharge: 2023-08-18 | Disposition: A | Discharge status: Home / Self Care | Attending: Urology | Admitting: Urology

## 2023-08-18 DIAGNOSIS — N5201 Erectile dysfunction due to arterial insufficiency: Secondary | ICD-10-CM | POA: Diagnosis present

## 2023-08-19 ENCOUNTER — Ambulatory Visit: Payer: Self-pay | Admitting: Urology

## 2023-08-19 LAB — TESTOSTERONE: Testosterone: 179 ng/dL — ABNORMAL LOW (ref 264–916)

## 2023-08-21 ENCOUNTER — Ambulatory Visit
Admission: RE | Admit: 2023-08-21 | Discharge: 2023-08-21 | Disposition: A | Discharge status: Home / Self Care | Source: Ambulatory Visit | Attending: Nephrology | Admitting: Nephrology

## 2023-08-21 DIAGNOSIS — R809 Proteinuria, unspecified: Secondary | ICD-10-CM | POA: Diagnosis present

## 2023-08-21 DIAGNOSIS — N1832 Chronic kidney disease, stage 3b: Secondary | ICD-10-CM | POA: Insufficient documentation

## 2023-08-29 ENCOUNTER — Encounter: Payer: Self-pay | Admitting: Nurse Practitioner

## 2023-08-29 NOTE — Progress Notes (Signed)
 MRN : [id number]  Darin Waters is a 63 y.o. ([birth year]) male who presents with chief complaint of check circulation.  History of Present Illness:   The patient returns to the office for followup and review status post angiogram with intervention on 02/07/2020.    Procedure:    Percutaneous transluminal angioplasty and stent placement left common iliac artery with a 12 x 40 life star stent   The patient notes his walking remains stable , no decrease in his walking distance or increase in leg pain.  No interval shortening of the patient's claudication distance or rest pain symptoms. Previous wounds have now healed.  No new ulcers or wounds have occurred since the last visit.  The 1 thing the patient did note is that his left foot seems to be more numb than it was in the past.   There have been no significant changes to the patient's overall health care.   The patient denies amaurosis fugax or recent TIA symptoms. There are no recent neurological changes noted. The patient denies history of DVT, PE or superficial thrombophlebitis. The patient denies recent episodes of angina or shortness of breath.   ABI's Rt=1.13 and Lt=1.07  (previous ABI's Rt=1.08 and Lt=1.08)   Previous duplex US  of the aorto iliac arterial system shows widely patent iliac stent  No outpatient medications have been marked as taking for the 08/31/23 encounter (Appointment) with Prescilla Brod, Ninette Basque, MD.    Past Medical History:  Diagnosis Date   Arthritis    DDD- lumbar , thumbs - both hands    COPD (chronic obstructive pulmonary disease) (HCC)    Depression    Diabetes mellitus without complication (HCC)    GERD (gastroesophageal reflux disease)    History of kidney stones 1990's    lithotripsy done    Hypertension    Hypothyroidism    Sleep apnea 01/30/2016   [facility] [place], results not avail to pt. yet    Past Surgical  History:  Procedure Laterality Date   ANTERIOR CERVICAL DECOMP/DISCECTOMY FUSION N/A 07/18/2015   Procedure: CERVICALFIVE-SIX, CERVICAL SIX-SEVEN ANTERIOR CERVICAL DECOMPRESSION/DISCECTOMY FUSION;  Surgeon: Garry Kansas, MD;  Location: [facility];  Service: Neurosurgery;  Laterality: N/A;  C56 C67 anterior cervical decompression with fusion interbody prosthesis plating and bonegraft   COLONOSCOPY     COLONOSCOPY WITH PROPOFOL  N/A 08/03/2019   Procedure: COLONOSCOPY WITH PROPOFOL ;  Surgeon: Toledo, Alphonsus Jeans, MD;  Location: [facility];  Service: Gastroenterology;  Laterality: N/A;   ESOPHAGOGASTRODUODENOSCOPY (EGD) WITH PROPOFOL  N/A 08/03/2019   Procedure: ESOPHAGOGASTRODUODENOSCOPY (EGD) WITH PROPOFOL ;  Surgeon: Toledo, Alphonsus Jeans, MD;  Location: [facility];  Service: Gastroenterology;  Laterality: N/A;   leg stint     nov 2021   LOWER EXTREMITY ANGIOGRAPHY Left 02/07/2020   Procedure: LOWER EXTREMITY ANGIOGRAPHY;  Surgeon: Jackquelyn Mass, MD;  Location: [facility];  Service: Cardiovascular;  Laterality: Left;   RECTAL POLYPECTOMY  4-5 yrs ago    Social History Social History   Tobacco Use   Smoking status: Every Day    Types: E-cigarettes  Last attempt to quit: 05/2016    Years since quitting: 7.3   Smokeless tobacco: Never   Tobacco comments:    Pt uses a vape 10/22/21  Vaping Use   Vaping status: Every Day   Substances: Nicotine  Substance Use Topics   Alcohol use: Yes    Alcohol/week: 3.0 standard drinks of alcohol    Types: 3 Cans of beer per week    Comment: daily    Drug use: No    Family History Family History  Problem Relation Age of Onset   Diabetes Mother    Asthma Mother    Hypertension Mother    Hyperlipidemia Mother    Hyperlipidemia Father    Dementia Father    Hypertension Father    Heart Problems Father        pace maker    No Known Allergies   REVIEW OF SYSTEMS (Negative unless checked)  Constitutional: [] Weight loss   [] Fever  [] Chills Cardiac: [] Chest pain   [] Chest pressure   [] Palpitations   [] Shortness of breath when laying flat   [] Shortness of breath with exertion. Vascular:  [x] Pain in legs with walking   [] Pain in legs at rest  [] History of DVT   [] Phlebitis   [] Swelling in legs   [] Varicose veins   [] Non-healing ulcers Pulmonary:   [] Uses home oxygen   [] Productive cough   [] Hemoptysis   [] Wheeze  [] COPD   [] Asthma Neurologic:  [] Dizziness   [] Seizures   [] History of stroke   [] History of TIA  [] Aphasia   [] Vissual changes   [] Weakness or numbness in arm   [] Weakness or numbness in leg Musculoskeletal:   [] Joint swelling   [] Joint pain   [] Low back pain Hematologic:  [] Easy bruising  [] Easy bleeding   [] Hypercoagulable state   [] Anemic Gastrointestinal:  [] Diarrhea   [] Vomiting  [] Gastroesophageal reflux/heartburn   [] Difficulty swallowing. Genitourinary:  [] Chronic kidney disease   [] Difficult urination  [] Frequent urination   [] Blood in urine Skin:  [] Rashes   [] Ulcers  Psychological:  [] History of anxiety   []  History of major depression.  Physical Examination  There were no vitals filed for this visit. There is no height or weight on file to calculate BMI. Gen: WD/WN, NAD Head: [place]/AT, No temporalis wasting.  Ear/Nose/Throat: Hearing grossly intact, nares w/o erythema or drainage Eyes: PER, EOMI, sclera nonicteric.  Neck: Supple, no masses.  No bruit or JVD.  Pulmonary:  Good air movement, no audible wheezing, no use of accessory muscles.  Cardiac: RRR, normal S1, S2, no Murmurs. Vascular:  mild trophic changes, no open wounds Vessel Right Left  Radial Palpable Palpable  PT Not Palpable Not Palpable  DP Not Palpable Not Palpable  Gastrointestinal: soft, non-distended. No guarding/no peritoneal signs.  Musculoskeletal: M/S 5/5 throughout.  No visible deformity.  Neurologic: CN 2-12 intact. Pain and light touch intact in extremities.  Symmetrical.  Speech is fluent. Motor exam as listed  above. Psychiatric: Judgment intact, Mood & affect appropriate for pt's clinical situation. Dermatologic: No rashes or ulcers noted.  No changes consistent with cellulitis.   CBC Lab Results  Component Value Date   WBC 20.2 (H) 02/14/2016   HGB 14.0 02/14/2016   HCT 40.8 02/14/2016   MCV 96.7 02/14/2016   PLT 219 02/14/2016    BMET    Component Value Date/Time   NA 136 10/10/2019 1642   NA 140 04/11/2011 0738   K 5.7 (H) 10/10/2019 1642   K 3.7 04/11/2011 1191  CL 100 10/10/2019 1642   CL 105 04/11/2011 0738   CO2 26 02/14/2016 0220   CO2 28 04/11/2011 0738   GLUCOSE 165 (H) 10/10/2019 1642   GLUCOSE 212 (H) 02/14/2016 0220   GLUCOSE 155 (H) 04/11/2011 0738   BUN 21 02/07/2020 0730   BUN 20 10/10/2019 1642   BUN 14 04/11/2011 0738   CREATININE 1.23 02/07/2020 0730   CREATININE 0.85 04/11/2011 0738   CALCIUM 9.8 10/10/2019 1642   CALCIUM 9.3 04/11/2011 0738   GFRNONAA >60 02/07/2020 0730   GFRNONAA >60 04/11/2011 0738   GFRAA 61 10/10/2019 1642   GFRAA >60 04/11/2011 0738   CrCl cannot be calculated (Patient's most recent lab result is older than the maximum 21 days allowed.).  COAG No results found for: "INR", "PROTIME"  Radiology No results found.   Assessment/Plan 1. Atherosclerosis of native artery of both lower extremities with intermittent claudication (HCC) (Primary)  Recommend:  The patient has evidence of atherosclerosis of the lower extremities with claudication.  The patient does not voice lifestyle limiting changes at this point in time.  Noninvasive studies do not suggest clinically significant change.  No invasive studies, angiography or surgery at this time The patient should continue walking and begin a more formal exercise program.  The patient should continue antiplatelet therapy and aggressive treatment of the lipid abnormalities  No changes in the patient's medications at this time  Continued surveillance is indicated as  atherosclerosis is likely to progress with time.    The patient will continue follow up with noninvasive studies as ordered.  - VAS US  ABI WITH/WO TBI; Future - VAS US  AORTA/IVC/ILIACS; Future  2. Coronary artery disease of native artery of native heart with stable angina pectoris (HCC) Continue cardiac and antihypertensive medications as already ordered and reviewed, no changes at this time.  Continue statin as ordered and reviewed, no changes at this time  Nitrates PRN for chest pain  3. Hypertension, unspecified type Continue antihypertensive medications as already ordered, these medications have been reviewed and there are no changes at this time.  4. Chronic obstructive pulmonary disease, unspecified COPD type (HCC) Continue pulmonary medications and aerosols as already ordered, these medications have been reviewed and there are no changes at this time.   5. Type 2 diabetes mellitus with stage 3a chronic kidney disease, unspecified whether long term insulin  use (HCC) Continue hypoglycemic medications as already ordered, these medications have been reviewed and there are no changes at this time.  Hgb A1C to be monitored as already arranged by primary service    Devon Fogo, MD  08/29/2023 3:42 PM

## 2023-08-31 ENCOUNTER — Ambulatory Visit (INDEPENDENT_AMBULATORY_CARE_PROVIDER_SITE_OTHER)

## 2023-08-31 ENCOUNTER — Encounter (INDEPENDENT_AMBULATORY_CARE_PROVIDER_SITE_OTHER): Payer: Self-pay | Admitting: Vascular Surgery

## 2023-08-31 ENCOUNTER — Ambulatory Visit (INDEPENDENT_AMBULATORY_CARE_PROVIDER_SITE_OTHER): Admitting: Vascular Surgery

## 2023-08-31 VITALS — BP 105/61 | HR 70 | Resp 16 | Wt 220.2 lb

## 2023-08-31 DIAGNOSIS — J449 Chronic obstructive pulmonary disease, unspecified: Secondary | ICD-10-CM

## 2023-08-31 DIAGNOSIS — N1831 Chronic kidney disease, stage 3a: Secondary | ICD-10-CM

## 2023-08-31 DIAGNOSIS — I25118 Atherosclerotic heart disease of native coronary artery with other forms of angina pectoris: Secondary | ICD-10-CM | POA: Diagnosis not present

## 2023-08-31 DIAGNOSIS — I70213 Atherosclerosis of native arteries of extremities with intermittent claudication, bilateral legs: Secondary | ICD-10-CM | POA: Diagnosis not present

## 2023-08-31 DIAGNOSIS — I10 Essential (primary) hypertension: Secondary | ICD-10-CM | POA: Diagnosis not present

## 2023-08-31 DIAGNOSIS — E1122 Type 2 diabetes mellitus with diabetic chronic kidney disease: Secondary | ICD-10-CM

## 2023-09-01 LAB — VAS US ABI WITH/WO TBI
Left ABI: 0.96
Right ABI: 1.05

## 2023-09-06 ENCOUNTER — Encounter (INDEPENDENT_AMBULATORY_CARE_PROVIDER_SITE_OTHER): Payer: Self-pay | Admitting: Vascular Surgery

## 2023-09-14 ENCOUNTER — Ambulatory Visit (INDEPENDENT_AMBULATORY_CARE_PROVIDER_SITE_OTHER): Payer: Medicare HMO | Admitting: Vascular Surgery

## 2023-09-14 ENCOUNTER — Encounter (INDEPENDENT_AMBULATORY_CARE_PROVIDER_SITE_OTHER): Payer: Medicare HMO

## 2023-09-20 NOTE — Progress Notes (Unsigned)
 09/21/2023 9:50 AM  Darin Waters 10/11/1960 [id number]   Referring provider: Don Lauraine Collar, NP [address] [place],  [place] [address]  Urological history: 1. Erectile dysfunction - serum tesosterone (07/2023) 179 and 240 - failed PDE5I's  Chief Complaint  Patient presents with   Erectile Dysfunction   HPI: Darin Waters is a 63 y.o. man who presents today for ICI tritration.    Previous records reviewed.     He is having suboptimal effects with the penile inhibitors and wanted to transition to ICI hoping for better results.    The procedure is discussed with patient.  He is allowed to ask questions.  Questions were answered to his satisfaction.  We were able to proceed to the titration.  He did state that his brother who is 5 died of prostate cancer.  Physical Exam:  BP 133/69 (BP Location: Left Arm, Patient Position: Sitting, Cuff Size: Normal)   Pulse 77   Ht 5' 5 (1.651 m)   Wt 219 lb 6.4 oz (99.5 kg)   SpO2 99%   BMI 36.51 kg/m   Constitutional:  Well nourished. Alert and oriented, No acute distress. GU: No CVA tenderness.  No bladder fullness or masses.  Patient with circumcised phallus.   Urethral meatus is patent.  No penile discharge. No penile lesions or rashes.  Psychiatric: Normal mood and affect.   Procedure  Patient's left corpus cavernosum is identified.  An area near the base of the penis is cleansed with rubbing alcohol.  Careful to avoid the dorsal vein, 2 mcg of Trimix (papaverine 30 mg, phentolamine 1 mg and prostaglandin E1 10 mcg, Lot # [id number]$MzfnczAzqnmzIZPI_YYQWtkkbjRWMzabymiCXyZIIyOreYytP$$MzfnczAzqnmzIZPI_YYQWtkkbjRWMzabymiCXyZIIyOreYytP$  exp # [id number] is injected at a 90 degree angle into the left  corpus cavernosum near the base of the penis.  Patient experienced a semi firm erection in 15 minutes.    Patient's right corpus cavernosum is identified.  An area near the base of the penis is cleansed with rubbing alcohol.  Careful to avoid the dorsal vein,  2 mcg of Trimix (papaverine 30 mg, phentolamine 1 mg and  prostaglandin E1 10 mcg, Lot # [id number]$MzfnczAzqnmzIZPI_atGIEXYlDLmgqLDzCxBQcPGfTNsYSUDd$$MzfnczAzqnmzIZPI_atGIEXYlDLmgqLDzCxBQcPGfTNsYSUDd$  exp # [id number]  is injected at a 90 degree angle into the right corpus cavernosum near the base of the penis.  Patient experienced a semi firm erection in 15 minutes.    Assessment & Plan:    1.  Erectile dysfunction - Patient had a suboptimal response to the 0.4 cc of Trimix - He would like to work on his hypogonadism for the time being, he understands that this may not improve his ED - Advised patient of the condition of priapism, painful erection lasting for more than four hours, and to contact the office or seek treatment in the ED immediately   2. Hypogonadism - explained that the diagnosis of testosterone  deficiency/hypogonadism requires two morning testosterones at least two days apart below 300 to meet criteria, he has met criteria - will check PSA, FSH/LH and prolactin levels - explained that TRT is not a treatment for ED, he may see some improvement in his erections, but his ED will likely persist even with therapeutic levels of testosterone  - discussed potential side effects of testosterone  replacement  including stimulation of erythrocytosis; edema; gynecomastia; worsening sleep apnea; venous thromboembolism; testicular atrophy and infertility.   The theoretical risk of growth stimulation of an undetected prostate cancer was also discussed.  He was informed that current evidence does not provide any definitive answers regarding the risks  of testosterone  therapy on prostate cancer and cardiovascular disease. The need for periodic monitoring of his testosterone  level, PSA, hematocrit and DRE was discussed.  This monitoring will be conducted every three months during the first year of TRT and then every 6 months if blood work remains stable, if there is an abnormality found in follow up blood work, it will result in the monitoring of blood work more frequently  - advised that any missed or delayed appointments will also result in delays in the  refilling of the TRT as it is a controlled substance - Recommend starting TRT - We discussed the most common forms of replacement including intramuscular injection and gels and he desires to start injections - Rx testosterone  cypionate-200 mg every 2 weeks to start once I get his lab work returned as he does not have a recent PSA, if his PSA is normal, I will send in the prescription - Appointment will be made for injection training - Follow-up 5 weeks after starting TRT for testosterone  level and symptom check - discussed that we follow guidelines for age appropriate testosterone  levels to decide on dosing regimens for TRT and this is based on current agreements in the medical community and we will not deviate from this unless there is good scientific data to do so - 60-69 [phone]) (3.7-18.9) - insurance companies may not recognize the parameters we use to determine that the patient has low testosterone  and therefore, if they want to pursue TRT, it will have be out-of-pocket      Return for Testosterone  injection teaching if PSA returned normal .  James Lafalce, PA-C   [facility] [address] [place], [place] [address] [phone]  I spent 30 minutes on the day of the encounter to include pre-visit record review, face-to-face time with the patient, and post-visit ordering of tests.

## 2023-09-21 ENCOUNTER — Ambulatory Visit: Payer: Self-pay | Admitting: Urology

## 2023-09-21 ENCOUNTER — Encounter: Payer: Self-pay | Admitting: Urology

## 2023-09-21 ENCOUNTER — Other Ambulatory Visit
Admission: RE | Admit: 2023-09-21 | Discharge: 2023-09-21 | Disposition: A | Discharge status: Home / Self Care | Attending: Urology | Admitting: Urology

## 2023-09-21 ENCOUNTER — Ambulatory Visit: Admitting: Urology

## 2023-09-21 VITALS — BP 133/69 | HR 77 | Ht 65.0 in | Wt 219.4 lb

## 2023-09-21 DIAGNOSIS — N5201 Erectile dysfunction due to arterial insufficiency: Secondary | ICD-10-CM | POA: Diagnosis not present

## 2023-09-21 DIAGNOSIS — Z125 Encounter for screening for malignant neoplasm of prostate: Secondary | ICD-10-CM | POA: Insufficient documentation

## 2023-09-21 DIAGNOSIS — E291 Testicular hypofunction: Secondary | ICD-10-CM

## 2023-09-21 LAB — PSA: Prostatic Specific Antigen: 0.42 ng/mL (ref 0.00–4.00)

## 2023-09-21 MED ORDER — TESTOSTERONE CYPIONATE 200 MG/ML IM SOLN: 200.0000 mg | INTRAMUSCULAR | 0 refills | Status: DC

## 2023-09-21 NOTE — Patient Instructions (Signed)
 Hypogonadism, Male  Male hypogonadism is a condition of having a level of testosterone  that is lower than normal. Testosterone  is a chemical, or hormone, that is made mainly in the testicles. In boys, testosterone  is responsible for the development of male characteristics during puberty. These include: Making the penis bigger. Growing and building the muscles. Growing facial hair. Deepening the voice. In adult men, testosterone  is responsible for maintaining: An interest in sex and the ability to have sex. Muscle mass. Sperm production. Red blood cell production. Bone strength. Testosterone  also gives men energy and a sense of well-being. Testosterone  normally decreases as men age and the testicles make less testosterone . Testosterone  levels can vary from man to man. Not all men will have signs and symptoms of low testosterone . Weight, alcohol use, medicines, and certain medical conditions can affect a man's testosterone  level. What are the causes? This condition is caused by: A natural decrease in testosterone  that occurs as a man grows older. This is the main cause of this condition. Use of medicines, such as antidepressants, steroids, and opioids. Diseases and conditions that affect the testicles or the making of testosterone . These include: Injury or damage to the testicles from trauma, cancer, cancer treatment, or infection. Diabetes. Sleep apnea. Genetic conditions that men are born with. Disease of the pituitary gland. This gland is in the brain. It produces hormones. Obesity. Metabolic syndrome. This is a group of diseases that affect blood pressure, blood sugar, cholesterol, and belly fat. HIV or AIDS. Alcohol abuse. Kidney failure. Other long-term or chronic diseases. What are the signs or symptoms? Common symptoms of this condition include: Loss of interest in sex (low sex drive). Inability to have or maintain an erection (erectile dysfunction). Feeling tired  (fatigue). Mood changes, like irritability or depression. Loss of muscle and body hair. Infertility. Large breasts. Weight gain (obesity). How is this diagnosed? Your health care provider can diagnose hypogonadism based on: Your signs and symptoms. A physical exam to check your testosterone  levels. This includes blood tests. Testosterone  levels can change throughout the day. Levels are highest in the morning. You may need to have repeat blood tests before getting a diagnosis of hypogonadism. Depending on your medical history and test results, your health care provider may also do other tests to find the cause of low testosterone . How is this treated? This condition is treated with testosterone  replacement therapy. Testosterone  can be given by: Injection or through pellets inserted under the skin. Gels or patches placed on the skin or in the mouth. Testosterone  therapy is not for everyone. It has risks and side effects. Your health care provider will consider your medical history, your risk for prostate cancer, your age, and your symptoms before putting you on testosterone  replacement therapy. Follow these instructions at home: Take over-the-counter and prescription medicines only as told by your health care provider. Eat foods that are high in fiber, such as beans, whole grains, and fresh fruits and vegetables. Limit foods that are high in fat and processed sugars, such as fried or sweet foods. If you drink alcohol: Limit how much you have to 0-2 drinks a day. Know how much alcohol is in your drink. In the [place] one drink equals one 12 oz bottle of beer (355 mL), one 5 oz glass of wine (148 mL), or one 1 oz glass of hard liquor (44 mL). Return to your normal activities as told by your health care provider. Ask your health care provider what activities are safe for you. Keep all  follow-up visits. This is important. Contact a health care provider if: You have any of the signs or symptoms of  low testosterone . You have any side effects from testosterone  therapy. Summary Male hypogonadism is a condition of having a level of testosterone  that is lower than normal. The natural drop in testosterone  production that occurs with age is the most common cause of this condition. Low testosterone  can also be caused by many diseases and conditions that affect the testicles and the making of testosterone . This condition is treated with testosterone  replacement therapy. There are risks and side effects of testosterone  therapy. Your health care provider will consider your age, medical history, symptoms, and risks for prostate cancer before putting you on testosterone  therapy. This information is not intended to replace advice given to you by your health care provider. Make sure you discuss any questions you have with your health care provider. Document Revised: 02/23/2023 Document Reviewed: 02/23/2023 Elsevier Patient Education  2025 [employer].

## 2023-09-22 LAB — TESTOSTERONE: Testosterone: 168 ng/dL — ABNORMAL LOW (ref 264–916)

## 2023-09-22 LAB — FSH/LH
FSH: 13.2 m[IU]/mL — ABNORMAL HIGH (ref 1.5–12.4)
LH: 13.7 m[IU]/mL — ABNORMAL HIGH (ref 1.7–8.6)

## 2023-09-22 LAB — PROLACTIN: Prolactin: 8.8 ng/mL (ref 3.6–25.2)

## 2023-10-01 ENCOUNTER — Ambulatory Visit: Admitting: Urology

## 2023-10-07 NOTE — Progress Notes (Unsigned)
   Urological history: 1. Erectile dysfunction - serum tesosterone (07/2023) 179 and 240 - failed PDE5I's  Darin Waters is a 63 y.o.presents today for education on self administration of testosterone  injections.  He has been prescribed testosterone  cypionate 200 mg/milliliters for the treatment of hypogonadism.  He expresses interest in administering the injections at home and has had no prior experience with intramuscular or subcutaneous injections.  He is motivated to learn and asks appropriate questions.  Physical exam: There were no vitals taken for this visit.  Constitutional:  Well nourished. Alert and oriented, No acute distress. Psychiatric: Normal mood and affect.  No physical limitations noted that would interfere with self injections  Assessment: Hypogonadism-on testosterone  replacement therapy.  Needs instruction on safe for proper self injection technique.  Plan: - Reviewed and demonstrated the following with the patient: Hand hygiene Dried up medication with 18-gauge needle Switching to 23-gauge needle for injection Injections identification (gluteal or lateral thigh for IM: abdomen or thigh for SQ) Proper technique, angle, and depth Needle disposal sharps container Patient then performed return demonstration successfully with verbal guidance Provided written instructions and answered all questions Encouraged patient to call with any concerns or if unsure about future injections Will continue testosterone  therapy as prescribed Follow-up testosterone  level, hemoglobin and hematocrit in ***, *** after an injection

## 2023-10-08 ENCOUNTER — Encounter: Payer: Self-pay | Admitting: Urology

## 2023-10-08 ENCOUNTER — Ambulatory Visit: Admitting: Urology

## 2023-10-08 VITALS — BP 120/61 | HR 84 | Ht 66.0 in | Wt 220.0 lb

## 2023-10-08 DIAGNOSIS — E291 Testicular hypofunction: Secondary | ICD-10-CM | POA: Diagnosis not present

## 2023-10-22 ENCOUNTER — Ambulatory Visit: Admitting: Urology

## 2023-10-22 DIAGNOSIS — E291 Testicular hypofunction: Secondary | ICD-10-CM | POA: Diagnosis not present

## 2023-10-22 NOTE — Progress Notes (Signed)
 Testosterone  IM Injection  Due to Hypogonadism patient is present today for a Testosterone  Injection.  Medication: Testosterone  Cypionate Dose: 200 cc IM every 14 days Location: right upper outer buttocks Lot: [id number] Exp:08/2025  Patient wanted to demonstrate his competency with injecting the IM medication which he did up to the point when he depress the syringe to put the medication into his thigh muscle and unfortunately the needle separated from the syringe and it squirted all over the floor.  I tried to reach out to the pharmacy to explain the situation, so that he could get sooner refills, but I was unable to talk to anyone.  I was on hold for 15 minutes and then the call was disconnected.  Follow up: 5 weeks for testosterone , hemoglobin and hematocrit only

## 2023-11-05 ENCOUNTER — Other Ambulatory Visit: Payer: Self-pay | Admitting: Urology

## 2023-11-05 ENCOUNTER — Telehealth: Payer: Self-pay | Admitting: Urology

## 2023-11-05 DIAGNOSIS — E291 Testicular hypofunction: Secondary | ICD-10-CM

## 2023-11-05 MED ORDER — TESTOSTERONE CYPIONATE 200 MG/ML IM SOLN: 200.0000 mg | INTRAMUSCULAR | 0 refills | Status: DC

## 2023-11-05 NOTE — Telephone Encounter (Signed)
 Pt called and said that he just took his last testosterone  shot and he will need another one in 14 days. He will need a refill before his lab appt on the 28th of this month.

## 2023-11-26 ENCOUNTER — Other Ambulatory Visit

## 2023-11-26 ENCOUNTER — Telehealth: Payer: Self-pay | Admitting: Urology

## 2023-11-26 DIAGNOSIS — E291 Testicular hypofunction: Secondary | ICD-10-CM

## 2023-11-26 NOTE — Telephone Encounter (Signed)
 Pt came into the office to get labs and stated that the PAPA/PHENTO/PROSTAGLANDIN E1 30MG  test dose does not work and he need a stronger dose. Called into [facility] on [address]

## 2023-11-27 ENCOUNTER — Ambulatory Visit: Payer: Self-pay | Admitting: Urology

## 2023-11-27 ENCOUNTER — Other Ambulatory Visit: Payer: Self-pay | Admitting: Urology

## 2023-11-27 ENCOUNTER — Other Ambulatory Visit: Payer: Self-pay

## 2023-11-27 DIAGNOSIS — E291 Testicular hypofunction: Secondary | ICD-10-CM

## 2023-11-27 LAB — HEMOGLOBIN AND HEMATOCRIT, BLOOD
Hematocrit: 39.3 % (ref 37.5–51.0)
Hemoglobin: 12.8 g/dL — ABNORMAL LOW (ref 13.0–17.7)

## 2023-11-27 LAB — TESTOSTERONE: Testosterone: 1007 ng/dL — ABNORMAL HIGH (ref 264–916)

## 2023-11-27 MED ORDER — TESTOSTERONE CYPIONATE 200 MG/ML IM SOLN: 200.0000 mg | INTRAMUSCULAR | 0 refills | Status: AC

## 2023-11-27 NOTE — Telephone Encounter (Signed)
 Patient called back regarding message and would like to know if RX was sent to pharmacy. Please advise.

## 2023-11-27 NOTE — Progress Notes (Signed)
 Called patient back and he told me that he had given his testosterone  injection between 8:30-9:00 on the 11/26/2023 then had lab work done at 1:30pm  on the same day

## 2023-12-02 ENCOUNTER — Other Ambulatory Visit: Payer: Self-pay

## 2023-12-02 DIAGNOSIS — E291 Testicular hypofunction: Secondary | ICD-10-CM

## 2023-12-03 ENCOUNTER — Other Ambulatory Visit

## 2023-12-03 DIAGNOSIS — E291 Testicular hypofunction: Secondary | ICD-10-CM

## 2023-12-05 LAB — PSA: Prostate Specific Ag, Serum: 1 ng/mL (ref 0.0–4.0)

## 2023-12-05 LAB — TESTOSTERONE: Testosterone: 998 ng/dL — ABNORMAL HIGH (ref 264–916)

## 2024-01-04 NOTE — Progress Notes (Unsigned)
 01/04/2024 12:59 PM  Darin Waters Apr 24, 1960 [id number]   Referring provider: Don Lauraine Collar, NP [address] [place],  [place] [address]  Urological history: 1. Erectile dysfunction - serum tesosterone (07/2023) 179 and 240 - failed PDE5I's - failed Trimix (30/1/10)   No chief complaint on file.  HPI: Darin Waters is a 63 y.o. man who presents today for ICI tritration.    Previous records reviewed.     He is having suboptimal effects with the penile inhibitors and wanted to transition to ICI hoping for better results.    The procedure is discussed with patient.  He is allowed to ask questions.  Questions were answered to his satisfaction.  We were able to proceed to the titration.  He did state that his brother who is 5 died of prostate cancer.  Physical Exam:  There were no vitals taken for this visit.  Constitutional:  Well nourished. Alert and oriented, No acute distress. GU: No CVA tenderness.  No bladder fullness or masses.  Patient with circumcised phallus.   Urethral meatus is patent.  No penile discharge. No penile lesions or rashes.  Psychiatric: Normal mood and affect.   Procedure  Patient's left corpus cavernosum is identified.  An area near the base of the penis is cleansed with rubbing alcohol.  Careful to avoid the dorsal vein, 2 mcg of Trimix (papaverine 30 mg, phentolamine 1 mg and prostaglandin E1 10 mcg, Lot # [id number]  exp # [id number] is injected at a 90 degree angle into the left  corpus cavernosum near the base of the penis.  Patient experienced a semi firm erection in 15 minutes.    Patient's right corpus cavernosum is identified.  An area near the base of the penis is cleansed with rubbing alcohol.  Careful to avoid the dorsal vein,  2 mcg of Trimix (papaverine 30 mg, phentolamine 1 mg and prostaglandin E1 10 mcg, Lot # [id number]$MzfnczAzqnmzIZPI_VXArMyDqSTqOReTGrNYAMgTzvAbxgPva$$MzfnczAzqnmzIZPI_VXArMyDqSTqOReTGrNYAMgTzvAbxgPva$  exp # [id number]  is injected at a 90 degree angle into the right corpus cavernosum near the base of the penis.   Patient experienced a semi firm erection in 15 minutes.    Assessment & Plan:    1.  Erectile dysfunction - Patient had a suboptimal response to the 0.4 cc of Trimix - He would like to work on his hypogonadism for the time being, he understands that this may not improve his ED - Advised patient of the condition of priapism, painful erection lasting for more than four hours, and to contact the office or seek treatment in the ED immediately   2. Hypogonadism - explained that the diagnosis of testosterone  deficiency/hypogonadism requires two morning testosterones at least two days apart below 300 to meet criteria, he has met criteria - will check PSA, FSH/LH and prolactin levels - explained that TRT is not a treatment for ED, he may see some improvement in his erections, but his ED will likely persist even with therapeutic levels of testosterone  - discussed potential side effects of testosterone  replacement  including stimulation of erythrocytosis; edema; gynecomastia; worsening sleep apnea; venous thromboembolism; testicular atrophy and infertility.   The theoretical risk of growth stimulation of an undetected prostate cancer was also discussed.  He was informed that current evidence does not provide any definitive answers regarding the risks of testosterone  therapy on prostate cancer and cardiovascular disease. The need for periodic monitoring of his testosterone  level, PSA, hematocrit and DRE was discussed.  This monitoring will be conducted every three months during the  first year of TRT and then every 6 months if blood work remains stable, if there is an abnormality found in follow up blood work, it will result in the monitoring of blood work more frequently  - advised that any missed or delayed appointments will also result in delays in the refilling of the TRT as it is a controlled substance - Recommend starting TRT - We discussed the most common forms of replacement including intramuscular  injection and gels and he desires to start injections - Rx testosterone  cypionate-200 mg every 2 weeks to start once I get his lab work returned as he does not have a recent PSA, if his PSA is normal, I will send in the prescription - Appointment will be made for injection training - Follow-up 5 weeks after starting TRT for testosterone  level and symptom check - discussed that we follow guidelines for age appropriate testosterone  levels to decide on dosing regimens for TRT and this is based on current agreements in the medical community and we will not deviate from this unless there is good scientific data to do so - 60-69 [phone]) (3.7-18.9) - insurance companies may not recognize the parameters we use to determine that the patient has low testosterone  and therefore, if they want to pursue TRT, it will have be out-of-pocket      No follow-ups on file.  Clotilda Cornwall, PA-C   [facility] [address] [place], [place] [address] [phone]  I spent 30 minutes on the day of the encounter to include pre-visit record review, face-to-face time with the patient, and post-visit ordering of tests.

## 2024-01-05 ENCOUNTER — Telehealth: Payer: Self-pay

## 2024-01-05 NOTE — Telephone Encounter (Signed)
 Called patient to ask if he got his medication for tomorrows appt.patient didn't answer

## 2024-01-06 ENCOUNTER — Ambulatory Visit: Admitting: Urology

## 2024-01-06 ENCOUNTER — Encounter: Payer: Self-pay | Admitting: Urology

## 2024-01-06 VITALS — BP 120/62 | HR 72 | Ht 66.0 in | Wt 215.0 lb

## 2024-01-06 DIAGNOSIS — N138 Other obstructive and reflux uropathy: Secondary | ICD-10-CM | POA: Diagnosis not present

## 2024-01-06 DIAGNOSIS — E291 Testicular hypofunction: Secondary | ICD-10-CM | POA: Diagnosis not present

## 2024-01-06 DIAGNOSIS — N5201 Erectile dysfunction due to arterial insufficiency: Secondary | ICD-10-CM | POA: Diagnosis not present

## 2024-01-06 DIAGNOSIS — N401 Enlarged prostate with lower urinary tract symptoms: Secondary | ICD-10-CM

## 2024-01-06 MED ORDER — AMBULATORY NON FORMULARY MEDICATION: 0 refills | Status: AC

## 2024-01-22 ENCOUNTER — Ambulatory Visit: Admitting: Urology

## 2024-02-01 NOTE — Progress Notes (Signed)
 02/03/2024 2:17 PM   Darin Waters 1961/01/11 [id number]  Referring provider: Don Lauraine Collar, NP [address] [place],  [place] [address]  Urological history: 1.  Erectile dysfunction - testosterone  level (11/2023) 998 - failed PDE5i's   2.  Hypogonadism - Testosterone  level (11/2023) 998 - Hemoglobin/hematocrit (11/2023) 12.8/39.3 - Testosterone  cypionate 200 mg/mL, 1 cc every 14 days   3. BPH w/ LU TS  - PSA (11/2023) 1.0   Chief Complaint  Patient presents with   Hypogonadism   HPI: Darin Waters is a 63 y.o. man who presents today for to discuss options for ED.  Previous records reviewed.  He has been having issues with erectile dysfunction for the last 15 years.  Contributing factors of depression, COPD, diabetes, hypertension, hypothyroidism, sleep apnea, hypogonadism, smoking and alcohol consumption.  He has failed PDE 5 inhibitors.  He has also failed ICI.  He is now interested in an IPP.  He would like a more spontaneous sex life.    PMH: Past Medical History:  Diagnosis Date   Arthritis    DDD- lumbar , thumbs - both hands    COPD (chronic obstructive pulmonary disease) (HCC)    Depression    Diabetes mellitus without complication (HCC)    GERD (gastroesophageal reflux disease)    History of kidney stones 1990's    lithotripsy done    Hypertension    Hypothyroidism    Sleep apnea 01/30/2016   [facility] [place], results not avail to pt. yet    Surgical History: Past Surgical History:  Procedure Laterality Date   ANTERIOR CERVICAL DECOMP/DISCECTOMY FUSION N/A 07/18/2015   Procedure: CERVICALFIVE-SIX, CERVICAL SIX-SEVEN ANTERIOR CERVICAL DECOMPRESSION/DISCECTOMY FUSION;  Surgeon: Reyes Budge, MD;  Location: [facility];  Service: Neurosurgery;  Laterality: N/A;  C56 C67 anterior cervical decompression with fusion interbody prosthesis plating and bonegraft   COLONOSCOPY     COLONOSCOPY WITH PROPOFOL  N/A 08/03/2019    Procedure: COLONOSCOPY WITH PROPOFOL ;  Surgeon: Toledo, Ladell POUR, MD;  Location: [facility];  Service: Gastroenterology;  Laterality: N/A;   ESOPHAGOGASTRODUODENOSCOPY (EGD) WITH PROPOFOL  N/A 08/03/2019   Procedure: ESOPHAGOGASTRODUODENOSCOPY (EGD) WITH PROPOFOL ;  Surgeon: Toledo, Ladell POUR, MD;  Location: [facility];  Service: Gastroenterology;  Laterality: N/A;   leg stint     nov 2021   LOWER EXTREMITY ANGIOGRAPHY Left 02/07/2020   Procedure: LOWER EXTREMITY ANGIOGRAPHY;  Surgeon: Jama Cordella MATSU, MD;  Location: [facility];  Service: Cardiovascular;  Laterality: Left;   RECTAL POLYPECTOMY  4-5 yrs ago    Home Medications:  Allergies as of 02/03/2024   No Known Allergies      Medication List        Accurate as of February 03, 2024 11:59 PM. If you have any questions, ask your nurse or doctor.          albuterol  108 (90 Base) MCG/ACT inhaler Commonly known as: VENTOLIN  HFA Inhale 2 puffs into the lungs every 6 (six) hours as needed for wheezing or shortness of breath.   AMBULATORY NON FORMULARY MEDICATION Trimix (30/1/50)-(Pap/Phent/PGE)  Test Dose  1ml vial   Qty #3 Refills   [facility] [phone] Fax [phone]   amLODipine -benazepril  10-40 MG capsule Commonly known as: LOTREL Take 1 capsule by mouth daily before breakfast.   Aspirin  Low Dose 81 MG tablet Generic drug: aspirin  EC Take 81 mg by mouth daily.   atorvastatin 40 MG tablet Commonly known as: LIPITOR Take 40 mg by  mouth daily.   BD Pen Needle Micro U/F 32G X 6 MM Misc Generic drug: Insulin  Pen Needle Inject into the skin daily.   B-D ULTRAFINE III SHORT PEN 31G X 8 MM Misc Generic drug: Insulin  Pen Needle SMARTSIG:1 Each SUB-Q Daily   Raya Sure Pen Needle 31G X 6 MM Misc Generic drug: Insulin  Pen Needle Use 1 each once daily With insulin  pen   citalopram  10 MG tablet Commonly known as: CELEXA  Take 10 mg by mouth daily before breakfast.   esomeprazole 20  MG capsule Commonly known as: NEXIUM Take 20 mg by mouth daily before breakfast.   Farxiga 10 MG Tabs tablet Generic drug: dapagliflozin propanediol Take 10 mg by mouth every morning.   furosemide 20 MG tablet Commonly known as: LASIX Take 20 mg by mouth daily.   gabapentin  300 MG capsule Commonly known as: NEURONTIN  Take 300 mg by mouth 2 (two) times daily.   glimepiride  2 MG tablet Commonly known as: AMARYL  Take 2 mg by mouth in the morning and at bedtime.   HYDROcodone -acetaminophen  10-325 MG tablet Commonly known as: NORCO Take 0.5-1 tablet by mouth every 12 hours as needed for pain   Lantus SoloStar 100 UNIT/ML Solostar Pen Generic drug: insulin  glargine Inject 65 Units into the skin. What changed: Another medication with the same name was removed. Continue taking this medication, and follow the directions you see here. Changed by: CLOTILDA CORNWALL   levothyroxine 112 MCG tablet Commonly known as: SYNTHROID Take 112 mcg by mouth daily. What changed: Another medication with the same name was removed. Continue taking this medication, and follow the directions you see here. Changed by: CLOTILDA CORNWALL   metFORMIN  500 MG tablet Commonly known as: GLUCOPHAGE  Take 1,000 mg by mouth 2 (two) times daily with a meal.   multivitamin with minerals tablet Take 1 tablet by mouth daily.   nabumetone 750 MG tablet Commonly known as: RELAFEN Take 2 tablets by mouth daily.   NONFORMULARY OR COMPOUNDED ITEM Trimix (30/1/10)-(Pap/Phent/PGE)  Test Dose  1ml vial   Qty #3 Refills 0  [facility] [phone] Fax [phone]   OneTouch Verio test strip Generic drug: glucose blood CHECK BLOOD GLUCOSE UP TO 4 TIMES DAILY AS DIRECTED IN A ROTATING PATTERN   Ozempic (1 MG/DOSE) 4 MG/3ML Sopn Generic drug: Semaglutide (1 MG/DOSE) Inject 1 mg into the skin once a week. What changed: Another medication with the same name was removed. Continue taking this medication,  and follow the directions you see here. Changed by: CLOTILDA CORNWALL   sildenafil 50 MG tablet Commonly known as: VIAGRA Take 50 mg by mouth daily.   spironolactone 50 MG tablet Commonly known as: ALDACTONE Take 50 mg by mouth daily.   tadalafil  10 MG tablet Commonly known as: Cialis  Take 1 tablet (10 mg total) by mouth daily. Take additional 10mg  boost dose 45 minutes prior to sexual activity   testosterone  cypionate 200 MG/ML injection Commonly known as: DEPOTESTOSTERONE CYPIONATE Inject 1 mL (200 mg total) into the muscle every 14 (fourteen) days.   Trelegy Ellipta  100-62.5-25 MCG/ACT Aepb Generic drug: Fluticasone -Umeclidin-Vilant Inhale 1 puff into the lungs daily.        Allergies: No Known Allergies  Family History: Family History  Problem Relation Age of Onset   Diabetes Mother    Asthma Mother    Hypertension Mother    Hyperlipidemia Mother    Hyperlipidemia Father    Dementia Father    Hypertension Father    Heart Problems  Father        pace maker    Social History:  reports that he has been smoking e-cigarettes. He has never used smokeless tobacco. He reports current alcohol use of about 3.0 standard drinks of alcohol per week. He reports that he does not use drugs.  ROS: Pertinent ROS in HPI  Physical Exam: BP 112/60   Pulse 74   Ht 5' 6 (1.676 m)   Wt 215 lb (97.5 kg)   SpO2 95%   BMI 34.70 kg/m   Constitutional:  Well nourished. Alert and oriented, No acute distress. HEENT: [place] AT, moist mucus membranes.  Trachea midline Cardiovascular: No clubbing, cyanosis, or edema. Respiratory: Normal respiratory effort, no increased work of breathing. Neurologic: Grossly intact, no focal deficits, moving all 4 extremities. Psychiatric: Normal mood and affect.   Laboratory Data: See HPI and Epic I have reviewed the labs.   Pertinent Imaging: N/A  Assessment & Plan:    1. Erectile dysfunction - Erectile dysfunction, refractory to first- and  second-line therapies (PDE5 inhibitors and ICI). - Motivated candidate for surgical intervention - demonstrated the IPP with the office model  - Our practice does not perform IPP implantation; patient will require referral to a high-volume implanter for further evaluation and surgical planning - referral to Dr. Lovie at [facility]  2.  Hypogonadism - He will need repeat labs in December, testosterone  1 week after injection and hemoglobin and hematocrit  Return for repeat labs in December .  These notes generated with voice recognition software. I apologize for typographical errors.  CLOTILDA HELON RIGGERS  [facility] [address]  [address] [place], [place] [address] [phone]

## 2024-02-03 ENCOUNTER — Ambulatory Visit: Admitting: Urology

## 2024-02-03 ENCOUNTER — Encounter: Payer: Self-pay | Admitting: Urology

## 2024-02-03 VITALS — BP 112/60 | HR 74 | Ht 66.0 in | Wt 215.0 lb

## 2024-02-03 DIAGNOSIS — N5201 Erectile dysfunction due to arterial insufficiency: Secondary | ICD-10-CM

## 2024-02-03 DIAGNOSIS — E291 Testicular hypofunction: Secondary | ICD-10-CM | POA: Diagnosis not present

## 2024-02-03 NOTE — Patient Instructions (Signed)
[facility] [phone] 

## 2024-02-08 ENCOUNTER — Ambulatory Visit: Admitting: Dermatology

## 2024-02-08 ENCOUNTER — Encounter: Payer: Self-pay | Admitting: Dermatology

## 2024-02-08 DIAGNOSIS — B078 Other viral warts: Secondary | ICD-10-CM

## 2024-02-08 DIAGNOSIS — L570 Actinic keratosis: Secondary | ICD-10-CM | POA: Diagnosis not present

## 2024-02-08 DIAGNOSIS — Z7189 Other specified counseling: Secondary | ICD-10-CM

## 2024-02-08 DIAGNOSIS — W908XXA Exposure to other nonionizing radiation, initial encounter: Secondary | ICD-10-CM

## 2024-02-08 DIAGNOSIS — L578 Other skin changes due to chronic exposure to nonionizing radiation: Secondary | ICD-10-CM | POA: Diagnosis not present

## 2024-02-08 NOTE — Patient Instructions (Signed)
 Cryotherapy Aftercare  Wash gently with soap and water everyday.   Apply Vaseline Jelly daily until healed.     Recommend daily broad spectrum sunscreen SPF 30+ to sun-exposed areas, reapply every 2 hours as needed. Call for new or changing lesions.  Staying in the shade or wearing long sleeves, sun glasses (UVA+UVB protection) and wide brim hats (4-inch brim around the entire circumference of the hat) are also recommended for sun protection.      Due to recent changes in healthcare laws, you may see results of your pathology and/or laboratory studies on MyChart before the doctors have had a chance to review them. We understand that in some cases there may be results that are confusing or concerning to you. Please understand that not all results are received at the same time and often the doctors may need to interpret multiple results in order to provide you with the best plan of care or course of treatment. Therefore, we ask that you please give us  2 business days to thoroughly review all your results before contacting the office for clarification. Should we see a critical lab result, you will be contacted sooner.   If You Need Anything After Your Visit  If you have any questions or concerns for your doctor, please call our main line at [phone] and press option 4 to reach your doctor's medical assistant. If no one answers, please leave a voicemail as directed and we will return your call as soon as possible. Messages left after 4 pm will be answered the following business day.   You may also send us  a message via MyChart. We typically respond to MyChart messages within 1-2 business days.  For prescription refills, please ask your pharmacy to contact our office. Our fax number is [phone].  If you have an urgent issue when the clinic is closed that cannot wait until the next business day, you can page your doctor at the number below.    Please note that while we do our best to be  available for urgent issues outside of office hours, we are not available 24/7.   If you have an urgent issue and are unable to reach us , you may choose to seek medical care at your doctor's office, retail clinic, urgent care center, or emergency room.  If you have a medical emergency, please immediately call 911 or go to the emergency department.  Pager Numbers  - Dr. Hester: [phone]  - Dr. Jackquline: [phone]  - Dr. Claudene: [phone]   - Dr. Raymund: [phone]  In the event of inclement weather, please call our main line at [phone] for an update on the status of any delays or closures.  Dermatology Medication Tips: Please keep the boxes that topical medications come in in order to help keep track of the instructions about where and how to use these. Pharmacies typically print the medication instructions only on the boxes and not directly on the medication tubes.   If your medication is too expensive, please contact our office at [phone] option 4 or send us  a message through MyChart.   We are unable to tell what your co-pay for medications will be in advance as this is different depending on your insurance coverage. However, we may be able to find a substitute medication at lower cost or fill out paperwork to get insurance to cover a needed medication.   If a prior authorization is required to get your medication covered by your insurance company, please allow us   1-2 business days to complete this process.  Drug prices often vary depending on where the prescription is filled and some pharmacies may offer cheaper prices.  The website www.goodrx.com contains coupons for medications through different pharmacies. The prices here do not account for what the cost may be with help from insurance (it may be cheaper with your insurance), but the website can give you the price if you did not use any insurance.  - You can print the associated coupon and take it with your  prescription to the pharmacy.  - You may also stop by our office during regular business hours and pick up a GoodRx coupon card.  - If you need your prescription sent electronically to a different pharmacy, notify our office through [facility] or by phone at [phone] option 4.     Si Usted Necesita Algo Despus de Su Visita  Tambin puede enviarnos un mensaje a travs de Clinical cytogeneticist. Por lo general respondemos a los mensajes de MyChart en el transcurso de 1 a 2 das hbiles.  Para renovar recetas, por favor pida a su farmacia que se ponga en contacto con nuestra oficina. Randi lakes de fax es [place] [phone].  Si tiene un asunto urgente cuando la clnica est cerrada y que no puede esperar hasta el siguiente da hbil, puede llamar/localizar a su doctor(a) al nmero que aparece a continuacin.   Por favor, tenga en cuenta que aunque hacemos todo lo posible para estar disponibles para asuntos urgentes fuera del horario de [place], no estamos disponibles las 24 horas del da, los 7 [address]  [address] de la [place].   Si tiene un problema urgente y no puede comunicarse con nosotros, puede optar por buscar atencin mdica  en el consultorio de su doctor(a), en una clnica privada, en un centro de atencin urgente o en una sala de emergencias.  Si tiene Engineer, drilling, por favor llame inmediatamente al 911 o vaya a la sala de emergencias.  Nmeros de bper  - Dr. Hester: [phone]  - Dra. Jackquline: [phone]  - Dr. Claudene: [phone]  - Dra. Kitts: [phone]  En caso de inclemencias del [place], por favor llame a nuestra lnea principal al [phone] para una actualizacin sobre el estado de cualquier retraso o cierre.  Consejos para la medicacin en dermatologa: Por favor, guarde las cajas en las que vienen los medicamentos de uso tpico para ayudarle a seguir las instrucciones sobre dnde y cmo usarlos. Las farmacias generalmente imprimen las instrucciones del  medicamento slo en las cajas y no directamente en los tubos del [place].   Si su medicamento es muy caro, por favor, pngase en contacto con landry rieger llamando al [phone] y presione la opcin 4 o envenos un mensaje a travs de Clinical cytogeneticist.   No podemos decirle cul ser su copago por los medicamentos por adelantado ya que esto es diferente dependiendo de la cobertura de su seguro. Sin embargo, es posible que podamos encontrar un medicamento sustituto a Audiological scientist un formulario para que el seguro cubra el medicamento que se considera necesario.   Si se requiere una autorizacin previa para que su compaa de seguros malta su medicamento, por favor permtanos de 1 a 2 das hbiles para completar este proceso.  Los precios de los medicamentos varan con frecuencia dependiendo del Environmental consultant de dnde se surte la receta y alguna farmacias pueden ofrecer precios ms baratos.  El sitio web www.goodrx.com tiene cupones para medicamentos de Health and safety inspector. Los precios aqu no tienen en  cuenta lo que podra costar con la ayuda del seguro (puede ser ms barato con su seguro), pero el sitio web puede darle el precio si no Visual merchandiser.  - Puede imprimir el cupn correspondiente y llevarlo con su receta a la farmacia.  - Tambin puede pasar por nuestra oficina durante el horario de atencin regular y Education officer, museum una tarjeta de cupones de GoodRx.  - Si necesita que su receta se enve electrnicamente a una farmacia diferente, informe a nuestra oficina a travs de MyChart de [place] o por telfono llamando al [phone] y presione la opcin 4.

## 2024-02-08 NOTE — Progress Notes (Signed)
 New Patient Visit   Subjective  Darin Waters is a 63 y.o. male who presents for the following: Spot on tongue. States it is a wart. Had one removed ~4 months ago at [facility]. He states he could not get back in that office until next year.   The patient has spots, moles and lesions to be evaluated, some may be new or changing and the patient may have concern these could be cancer.    The following portions of the chart were reviewed this encounter and updated as appropriate: medications, allergies, medical history  Review of Systems:  No other skin or systemic complaints except as noted in HPI or Assessment and Plan.  Objective  Well appearing patient in no apparent distress; mood and affect are within normal limits.  A focused examination was performed of the following areas: Face, mouth, tongue Relevant physical exam findings are noted in the Assessment and Plan.  Right 5th Finger x2, R oral commissure x1 (3) Verrucous papules -- Discussed viral etiology and contagion.  Left Dorsal Hand x3, L forearm x5 (8) Erythematous thin papules/macules with gritty scale.   Assessment & Plan   WART Exam: verrucous papule on L tongue  Counseling Discussed viral / HPV (Human Papilloma Virus) etiology and risk of spread /infectivity to other areas of body as well as to other people.  Multiple treatments and methods may be required to clear warts and it is possible treatment may not be successful.  Treatment risks include discoloration; scarring and there is still potential for wart recurrence.  Treatment Plan: Recurrent after removal at [facility] dermat[facility] per patient report Recommend referral to ENT for treatment of lesion on tongue.   ACTINIC DAMAGE - chronic, secondary to cumulative UV radiation exposure/sun exposure over time - diffuse scaly erythematous macules with underlying dyspigmentation - Recommend daily broad spectrum sunscreen SPF 30+ to sun-exposed areas,  reapply every 2 hours as needed.  - Recommend staying in the shade or wearing long sleeves, sun glasses (UVA+UVB protection) and wide brim hats (4-inch brim around the entire circumference of the hat). - Call for new or changing lesions.  OTHER VIRAL WARTS (3) Right 5th Finger x2, R oral commissure x1 (3) Viral Wart (HPV) Counseling  Discussed viral / HPV (Human Papilloma Virus) etiology and risk of spread /infectivity to other areas of body as well as to other people.  Multiple treatments and methods may be required to clear warts and it is possible treatment may not be successful.  Treatment risks include discoloration; scarring and there is still potential for wart recurrence.  Treated lesions are thick and will likely require multiple treatments. Call for appointment if desired; patient plans to continue care at [facility] Destruction of lesion - Right 5th Finger x2, R oral commissure x1 (3) Complexity: simple   Destruction method: cryotherapy   Informed consent: discussed and consent obtained   Timeout:  patient name, date of birth, surgical site, and procedure verified Lesion destroyed using liquid nitrogen: Yes   Region frozen until ice ball extended beyond lesion: Yes   Cryo cycles: 1 or 2. Outcome: patient tolerated procedure well with no complications   Post-procedure details: wound care instructions given   Additional details:  Prior to procedure, discussed risks of blister formation, small wound, skin dyspigmentation, or rare scar following cryotherapy. Recommend Vaseline ointment to treated areas while healing.   Related Procedures Ambulatory referral to ENT AK (ACTINIC KERATOSIS) (8) Left Dorsal Hand x3, L forearm x5 (8) Actinic  keratoses are precancerous spots that appear secondary to cumulative UV radiation exposure/sun exposure over time. They are chronic with expected duration over 1 year. A portion of actinic keratoses will progress to squamous cell carcinoma of  the skin. It is not possible to reliably predict which spots will progress to skin cancer and so treatment is recommended to prevent development of skin cancer.  Recommend daily broad spectrum sunscreen SPF 30+ to sun-exposed areas, reapply every 2 hours as needed.  Recommend staying in the shade or wearing long sleeves, sun glasses (UVA+UVB protection) and wide brim hats (4-inch brim around the entire circumference of the hat). Call for new or changing lesions.  R distal medial forearm lesion is thick. Call for biopsy if it doesn't resolve; patient plans to continue care at [facility] Destruction of lesion - Left Dorsal Hand x3, L forearm x5 (8) Complexity: simple   Destruction method: cryotherapy   Informed consent: discussed and consent obtained   Timeout:  patient name, date of birth, surgical site, and procedure verified Lesion destroyed using liquid nitrogen: Yes   Region frozen until ice ball extended beyond lesion: Yes   Cryo cycles: 1 or 2. Outcome: patient tolerated procedure well with no complications   Post-procedure details: wound care instructions given   Additional details:  Prior to procedure, discussed risks of blister formation, small wound, skin dyspigmentation, or rare scar following cryotherapy. Recommend Vaseline ointment to treated areas while healing.   ACTINIC ELASTOSIS     Return if symptoms worsen or fail to improve.  I, Jill Parcell, CMA, am acting as scribe for Boneta Sharps, MD.   Documentation: I have reviewed the above documentation for accuracy and completeness, and I agree with the above.  Boneta Sharps, MD

## 2024-02-16 ENCOUNTER — Other Ambulatory Visit: Payer: Self-pay

## 2024-02-16 DIAGNOSIS — E291 Testicular hypofunction: Secondary | ICD-10-CM

## 2024-04-27 ENCOUNTER — Other Ambulatory Visit: Payer: Self-pay | Admitting: Urology

## 2024-05-09 ENCOUNTER — Other Ambulatory Visit

## 2024-06-21 ENCOUNTER — Ambulatory Visit (HOSPITAL_COMMUNITY): Admit: 2024-06-21 | Admitting: Urology

## 2024-07-27 ENCOUNTER — Encounter: Admitting: Internal Medicine

## 2024-08-11 ENCOUNTER — Ambulatory Visit: Admitting: Nurse Practitioner

## 2024-08-29 ENCOUNTER — Encounter (INDEPENDENT_AMBULATORY_CARE_PROVIDER_SITE_OTHER)

## 2024-08-29 ENCOUNTER — Ambulatory Visit (INDEPENDENT_AMBULATORY_CARE_PROVIDER_SITE_OTHER): Admitting: Vascular Surgery
